# Patient Record
Sex: Female | Born: 1970 | Race: White | Hispanic: No | Smoking: Former smoker
Health system: Southern US, Community
[De-identification: ages and names within clinical notes are randomized; demographics above are authoritative.]

## PROBLEM LIST (undated history)

## (undated) DIAGNOSIS — J45909 Unspecified asthma, uncomplicated: Secondary | ICD-10-CM

## (undated) DIAGNOSIS — R42 Dizziness and giddiness: Secondary | ICD-10-CM

## (undated) DIAGNOSIS — K219 Gastro-esophageal reflux disease without esophagitis: Secondary | ICD-10-CM

## (undated) DIAGNOSIS — G43909 Migraine, unspecified, not intractable, without status migrainosus: Secondary | ICD-10-CM

## (undated) DIAGNOSIS — R569 Unspecified convulsions: Secondary | ICD-10-CM

## (undated) DIAGNOSIS — R112 Nausea with vomiting, unspecified: Secondary | ICD-10-CM

## (undated) DIAGNOSIS — F419 Anxiety disorder, unspecified: Secondary | ICD-10-CM

## (undated) DIAGNOSIS — Z9889 Other specified postprocedural states: Secondary | ICD-10-CM

## (undated) DIAGNOSIS — T7840XA Allergy, unspecified, initial encounter: Secondary | ICD-10-CM

## (undated) HISTORY — DX: Migraine, unspecified, not intractable, without status migrainosus: G43.909

## (undated) HISTORY — PX: UPPER GASTROINTESTINAL ENDOSCOPY: SHX188

## (undated) HISTORY — DX: Allergy, unspecified, initial encounter: T78.40XA

## (undated) HISTORY — PX: COLONOSCOPY: SHX174

## (undated) HISTORY — DX: Anxiety disorder, unspecified: F41.9

## (undated) HISTORY — DX: Unspecified convulsions: R56.9

---

## 1998-07-08 ENCOUNTER — Other Ambulatory Visit: Admission: RE | Admit: 1998-07-08 | Discharge: 1998-07-08 | Payer: Self-pay | Admitting: Obstetrics and Gynecology

## 1998-09-24 ENCOUNTER — Encounter: Admission: RE | Admit: 1998-09-24 | Discharge: 1998-12-09 | Payer: Self-pay | Admitting: Orthopedic Surgery

## 1998-11-02 ENCOUNTER — Ambulatory Visit (HOSPITAL_COMMUNITY): Admission: RE | Admit: 1998-11-02 | Discharge: 1998-11-02 | Payer: Self-pay | Admitting: Orthopedic Surgery

## 1998-11-02 ENCOUNTER — Encounter: Payer: Self-pay | Admitting: Orthopedic Surgery

## 1999-12-12 ENCOUNTER — Other Ambulatory Visit: Admission: RE | Admit: 1999-12-12 | Discharge: 1999-12-12 | Payer: Self-pay | Admitting: Obstetrics and Gynecology

## 2000-12-13 ENCOUNTER — Other Ambulatory Visit: Admission: RE | Admit: 2000-12-13 | Discharge: 2000-12-13 | Payer: Self-pay | Admitting: Obstetrics and Gynecology

## 2001-11-22 ENCOUNTER — Other Ambulatory Visit: Admission: RE | Admit: 2001-11-22 | Discharge: 2001-11-22 | Payer: Self-pay | Admitting: Obstetrics and Gynecology

## 2002-12-25 ENCOUNTER — Other Ambulatory Visit: Admission: RE | Admit: 2002-12-25 | Discharge: 2002-12-25 | Payer: Self-pay | Admitting: Obstetrics and Gynecology

## 2003-05-10 ENCOUNTER — Emergency Department (HOSPITAL_COMMUNITY): Admission: EM | Admit: 2003-05-10 | Discharge: 2003-05-10 | Payer: Self-pay | Admitting: Emergency Medicine

## 2004-01-26 ENCOUNTER — Other Ambulatory Visit: Admission: RE | Admit: 2004-01-26 | Discharge: 2004-01-26 | Payer: Self-pay | Admitting: Obstetrics and Gynecology

## 2004-08-03 ENCOUNTER — Encounter: Admission: RE | Admit: 2004-08-03 | Discharge: 2004-11-01 | Payer: Self-pay | Admitting: Family Medicine

## 2005-05-09 ENCOUNTER — Other Ambulatory Visit: Admission: RE | Admit: 2005-05-09 | Discharge: 2005-05-09 | Payer: Self-pay | Admitting: Obstetrics and Gynecology

## 2006-06-18 ENCOUNTER — Other Ambulatory Visit: Admission: RE | Admit: 2006-06-18 | Discharge: 2006-06-18 | Payer: Self-pay | Admitting: Obstetrics and Gynecology

## 2006-07-17 ENCOUNTER — Ambulatory Visit (HOSPITAL_COMMUNITY): Admission: RE | Admit: 2006-07-17 | Discharge: 2006-07-17 | Payer: Self-pay | Admitting: Obstetrics & Gynecology

## 2006-07-17 ENCOUNTER — Encounter (INDEPENDENT_AMBULATORY_CARE_PROVIDER_SITE_OTHER): Payer: Self-pay | Admitting: *Deleted

## 2006-09-18 ENCOUNTER — Encounter (INDEPENDENT_AMBULATORY_CARE_PROVIDER_SITE_OTHER): Payer: Self-pay | Admitting: Specialist

## 2006-09-18 ENCOUNTER — Inpatient Hospital Stay (HOSPITAL_COMMUNITY): Admission: RE | Admit: 2006-09-18 | Discharge: 2006-09-20 | Payer: Self-pay | Admitting: Obstetrics & Gynecology

## 2006-10-17 ENCOUNTER — Encounter: Admission: RE | Admit: 2006-10-17 | Discharge: 2006-10-17 | Payer: Self-pay | Admitting: Obstetrics & Gynecology

## 2010-03-22 ENCOUNTER — Emergency Department (HOSPITAL_BASED_OUTPATIENT_CLINIC_OR_DEPARTMENT_OTHER): Admission: EM | Admit: 2010-03-22 | Discharge: 2010-03-22 | Payer: Self-pay | Admitting: Emergency Medicine

## 2010-04-16 ENCOUNTER — Emergency Department (HOSPITAL_BASED_OUTPATIENT_CLINIC_OR_DEPARTMENT_OTHER): Admission: EM | Admit: 2010-04-16 | Discharge: 2010-04-17 | Payer: Self-pay | Admitting: Emergency Medicine

## 2010-08-21 ENCOUNTER — Emergency Department (HOSPITAL_BASED_OUTPATIENT_CLINIC_OR_DEPARTMENT_OTHER)
Admission: EM | Admit: 2010-08-21 | Discharge: 2010-08-21 | Payer: Self-pay | Source: Home / Self Care | Admitting: Emergency Medicine

## 2010-08-28 HISTORY — PX: ABDOMINAL HYSTERECTOMY: SHX81

## 2010-10-14 ENCOUNTER — Emergency Department (HOSPITAL_COMMUNITY): Payer: Self-pay

## 2010-10-14 ENCOUNTER — Emergency Department (HOSPITAL_COMMUNITY)
Admission: EM | Admit: 2010-10-14 | Discharge: 2010-10-14 | Disposition: A | Discharge status: Home / Self Care | Payer: Self-pay | Attending: Emergency Medicine | Admitting: Emergency Medicine

## 2010-10-14 DIAGNOSIS — L03211 Cellulitis of face: Secondary | ICD-10-CM | POA: Insufficient documentation

## 2010-10-14 DIAGNOSIS — L0201 Cutaneous abscess of face: Secondary | ICD-10-CM | POA: Insufficient documentation

## 2010-10-14 DIAGNOSIS — Z79899 Other long term (current) drug therapy: Secondary | ICD-10-CM | POA: Insufficient documentation

## 2010-10-14 DIAGNOSIS — R220 Localized swelling, mass and lump, head: Secondary | ICD-10-CM | POA: Insufficient documentation

## 2010-10-14 DIAGNOSIS — G40909 Epilepsy, unspecified, not intractable, without status epilepticus: Secondary | ICD-10-CM | POA: Insufficient documentation

## 2010-10-14 DIAGNOSIS — R51 Headache: Secondary | ICD-10-CM | POA: Insufficient documentation

## 2010-10-14 LAB — POCT I-STAT, CHEM 8
BUN: <3 mg/dL — ABNORMAL LOW (ref 6–23)
Calcium, Ion: 1.16 mmol/L (ref 1.12–1.32)
Chloride: 104 mEq/L (ref 96–112)
Creatinine, Ser: 0.7 mg/dL (ref 0.4–1.2)
Glucose, Bld: 82 mg/dL (ref 70–99)
HCT: 45 % (ref 36.0–46.0)
Hemoglobin: 15.3 g/dL — ABNORMAL HIGH (ref 12.0–15.0)
Potassium: 3.7 mEq/L (ref 3.5–5.1)
Sodium: 140 mEq/L (ref 135–145)
TCO2: 24 mmol/L (ref 0–100)

## 2010-10-14 MED ORDER — IOHEXOL 300 MG/ML  SOLN
100.0000 mL | Freq: Once | INTRAMUSCULAR | Status: AC | PRN
  Administered 2010-10-14: 100 mL via INTRAVENOUS

## 2010-10-19 NOTE — Consult Note (Signed)
NAMEJAHNIA, [name]                ACCOUNT NO.:  [id number]  MEDICAL RECORD NO.:  [id number]           PATIENT TYPE:  E  LOCATION:  WLED                         FACILITY:  [facility]  PHYSICIAN:  Newman Pies, MD            DATE OF BIRTH:  10/27/1970  DATE OF CONSULTATION:  10/14/2010 DATE OF DISCHARGE:  10/14/2010                                CONSULTATION   SERVICE:  Otolaryngology head and neck surgery.  CHIEF COMPLAINT:  Facial abscess.  HISTORY OF PRESENT ILLNESS:  The patient is a 40 year old female who presents to the [facility] Emergency Room today complaining of progressive left facial swelling.  According to the patient, she has been experiencing progressive left facial swelling for the last 3 days. The patient reports that she has a history of a small bump at the left midface.  However, over the last 3 days, it has progressively swollen and it has been very tender to touch.  The patient denies any visual change, breathing difficulty, dysphagia, odynophagia, fever, or dental pain.  She has no previous history of sinonasal surgery or orbital disorder.  Upon arrival at the emergency room, facial CT scan was obtained.  The CT shows soft tissue swelling involving the left maxilla. There is a rim enhancing fluid collection anterior to the left maxillary sinus, lateral to the nose on the left.  The fluid collection measures approximately 1 cm in diameter and is located in the subcutaneous tissue.  The findings are compatible with a soft tissue abscess.  PAST MEDICAL HISTORY:  Migraine and seizure disorder.  PAST SURGICAL HISTORY:  Hysterectomy.  HOME MEDICATIONS:  Phenobarbital  ALLERGIES:  ASPIRIN, PENICILLIN, IBUPROFEN.  PHYSICAL EXAMINATION:  VITAL SIGNS:  Temperature 98.1, blood pressure 92/57, pulse 85, respiration 18, oxygen saturation 96% on room air. GENERAL:  The patient is a well-nourished and well-developed 40 year old female in moderate distress secondary to the  facial pain. She is alert and oriented x3. HEENT:  Her pupils are equal, round, reactive to light.  Extraocular motion is intact.  Examination of the ears shows normal auricles, external auditory canals, and tympanic membranes bilaterally.  Facial examination shows severe edema and erythema of the left midface.  The edema is most acute at the infraorbital area immediately lateral to the nose.  Nasal examination shows normal mucosa, septum, and turbinates. Oral cavity examination shows normal lips, gums, tongue, oral cavity and oropharyngeal mucosa. NECK:  Palpation of the neck reveals no lymphadenopathy or mass.  The trachea is midline.  No stridor is noted.  PROCEDURE PERFORMED:  Incision and drainage of the facial abscess.  ANESTHESIA:  Local anesthesia with 2% lidocaine.  DESCRIPTION:  The patient was placed supine on the hospital bed.  The left midface was prepped in a sterile fashion with Betadine.  A 2% lidocaine was subsequently infiltrated around the left midface area. After adequate local anesthesia was achieved, a #11 blade was used to make an incision overlying the left facial abscess.  Purulent material was expressed from the abscess site.  The abscess drainage area was extensively explored and irrigated.  That concluded the procedure for the patient. The patient tolerated the procedure well.  IMPRESSION:  Left facial cellulitis/abscess.  RECOMMENDATIONS: 1. Incision and drainage of the left facial abscess in the emergency     room. 2. The patient will be placed on clindamycin 300 mg p.o. q.i.d. for 10     days. 3. Vicodin one to two tablet p.o. q.4-6 h p.r.n. pain. 4. The patient will follow up in my office in approximately 10 days.     Newman Pies, MD     ST/MEDQ  D:  10/14/2010  T:  10/15/2010  Job:  [id number]  Electronically Signed by Newman Pies MD on 10/19/2010 11:34:56 AM

## 2011-01-13 NOTE — Op Note (Signed)
NAMEVIVEKA, WILMETH                ACCOUNT NO.:  [id number]   MEDICAL RECORD NO.:  [id number]          PATIENT TYPE:  AMB   LOCATION:  SDC                           FACILITY:  [facility]   PHYSICIAN:  M. Leda Quail, MD  DATE OF BIRTH:  [birth year]   DATE OF PROCEDURE:  07/17/2006  DATE OF DISCHARGE:  07/17/2006                               OPERATIVE REPORT   PREOPERATIVE DIAGNOSES:  40. A 40 year old G-0 married white female with chronic pelvic pain.  2. History of seizure disorder.  3. Poor dentition.  4. Smoking history.   POSTOPERATIVE DIAGNOSIS:  1. A 40 year old G-0 married white female with chronic pelvic pain.  2. History of seizure disorder.  3. Poor dentition.  4. Smoking history.  5. There is a 5 cm right simple cyst with filmy pelvic adhesions and      dense bladder adhesions.  She as well has adhesions on the right      sidewall.   PROCEDURE:  Diagnostic laparoscopy with right cyst removal and lysis of  adhesions for approximately 20 minutes.   SURGEON:  M. Leda Quail, MD   ASSISTANT:  OR staff.   ANESTHESIA:  General endotracheal.   SPECIMEN:  Cyst fluid sent to cytology.   ESTIMATED BLOOD LOSS:  Minimal.   URINE OUTPUT:  25 mL drained at the beginning of the procedure by I&O  cath.   FLUIDS:  1300 mL of LR.   COMPLICATIONS:  None.   INDICATIONS:  This area is a 40 year old married white female with a  history of chronic pelvic pain.  She has been followed by Shirlyn Goltz  for many years.  She has had an acute change in her of pelvic pain which  has been worse on the right side.  The patient did undergo a pelvic  ultrasound for preoperative workup which showed a small simple cyst on  the right ovary.  The patient was initially interested in having her  ovary removed due to the cyst but after discussing the cyclical nature  of cysts and her chronic pelvic pain we decided to go ahead and proceed  with a diagnostic laparoscopy to see if we could identify  the source of  her pelvic pain.  Pain is somewhat cyclical and related to menstrual  cycles but in general there most of the time.   OPERATIVE COURSE:  The patient was taken to the operating room.  She was  placed in the supine position.  General anesthesia was administered  without difficulty by the anesthesia staff.  The patient's legs were  placed in the low lithotomy position in Keene stirrups.  The abdomen,  perineum, inner thighs and vagina were prepped in a normal sterile  fashion.  The bladder was drained of all urine with a red rubber  catheter.  Then the abdomen and perineum were draped in a normal sterile  fashion.  Using a knife a 10 mm skin incision was made beneath the  umbilicus.  Subcutaneous fat and tissue was dissected.  The abdomen was  lifted by the operator after palpation of the  sacral promontory.  Using  a Veress needle and aiming toward the pelvis, the abdominal wall layers  were traversed.  Once the peritoneum was popped through, sterile saline  was attached to the Veress needle.  The saline was then aspirated.  No  fluid was noted.  Fluid was injected easily and aspirated again and  there was no evidence of saline or other fluid that was returned.  A  drip test was performed and fluid dripped easily into the pelvis.  CO2  gas was then attached and placed on low flow.  Pressures were less and  10 mmHg and pneumoperitoneum was achieved without difficulty.  Once 2-  1/2 liters of CO2 gas were in the abdomen the Veress needle was removed.  The abdomen was again elevated and using a #10/11 bladed trocar and port  this trocar and port were placed into abdomen by aiming toward the  pelvis with direct entry.  The trocar was removed and laparoscope was  used to confirm intraperitoneal placement.  This was indeed present.  Patient positioned in Trendelenberg positioning.  The pelvis was  surveyed.  There was about a 5 cm right simple cyst that was noted on  the ovary.   There were adhesions along the fallopian tubes bilaterally.  There was adhesions all throughout the posterior cul-de-sac.  There was  dense adhesion connecting the uterus to the bladder flap and keeping the  uterus from being mobile.  The fallopian tubes bilaterally appeared  clubbed and relatively unhealthy.  The upper abdomen was surveyed.  There was no adhesions to the liver edge.  The liver edge appeared  normal.  The gallbladder appeared normal.  Stomach edge appeared normal.  There were some adhesions on the right sidewall.  The patient has not  had any previous surgeries so these cannot be explained other than  previous pelvic infection.   As the surgery was going to involve an operative portion, placement for  right and left lower quadrant ports were chosen using transillumination  of the abdominal wall vessels.  2 mL of 0.5% Marcaine were instilled in  the skin before 5 mm skin incisions were made in the right and left  lower quadrants.  The 5 mm bladed trocars and ports were placed in the  right and left lower quadrant under direct visualization with the  laparoscope.  Trocars were removed..  Then using a smooth endoscopic  grasper and endoscopic scissors with the bipolar cautery attached, the  pelvic adhesions were initially free from the left fallopian tube and  the left ovary until the left tube and ovary were freely mobile.  Then  pelvic adhesions were dissected free in the pelvis.  Cautery was  performed as necessary.  There were no bowel adhesions.  Then the large  right ovarian cyst was freed and elevated.  Once this was performed  using a bipolar cautery an incision was made in the cyst and the simple  fluid was drained.  The cyst wall was then removed by grasping with a  smooth endoscopic grasper.  Where the incision was made in the ovarian  cyst there was a small amount of bleeding and this was made hemostatic. The lysis of adhesions took approximately 20 minutes.  At  this point the  right and left ovaries were freely mobile, the fallopian tubes were  mobile and all of pelvic adhesions had been lysed.  A Nezhat suction  irrigator was then used to irrigate the pelvis.  No bleeding  was noted.  At this point the procedure was terminated.  Tendelenberg positioning  was relieved.  The instruments were removed.  The right and left lower  quadrant ports were removed under direct visualization.  The  pneumoperitoneum was then released.  The anesthesiologist gave several  breaths to the patient while the operator was pressing on the abdomen to  ensure all CO2 gas was removed.  Then the midline port was removed.  The  operator's index finger was placed in the incision to ensure that there  was no bowel in the incision.   The fascia of the umbilicus was closed with figure-of-eight suture of #2-  0 Vicryl on a UR needle.  The skin of the umbilicus was then closed with  a subcuticular stitch of 3-0 Vicryl Rapide.  The right and left lower  quadrant ports were hemostatic and they were closed with Dermabond.  All  instruments were then removed from the vagina.  The  patient's legs were placed back in the supine position.  Sponge, lap,  needle and instrument counts were correct x2.  The patient tolerated the  procedure well.  She was awakened from anesthesia and taken to recovery  room in stable vision.      Lum Keas, MD  Electronically Signed     MSM/MEDQ  D:  07/18/2006  T:  07/19/2006  Job:  [id number]

## 2011-01-13 NOTE — Op Note (Signed)
NAMECHRYSTINA, [name]                ACCOUNT NO.:  [id number]   MEDICAL RECORD NO.:  [id number]          PATIENT TYPE:  INP   LOCATION:  9312                          FACILITY:  [facility]   PHYSICIAN:  M. Leda Quail, MD  DATE OF BIRTH:  Sep 06, 1970   DATE OF PROCEDURE:  09/18/2006  DATE OF DISCHARGE:                               OPERATIVE REPORT   PREOPERATIVE DIAGNOSES:  73. A 40 year old gravida 0 white female with a history of chronic      pelvic pain.  2. Probable history of pelvic inflammatory disease.  3. History of recurrent ovarian cyst.  4. History of asthma.  5. Poor dentition.  6. Seizure disorder.   POSTOPERATIVE DIAGNOSES:  6. A 40 year old gravida 0 white female with a history of chronic      pelvic pain.  2. Probable history of pelvic inflammatory disease.  3. History of recurrent ovarian cyst.  4. History of asthma.  5. Poor dentition.  6. Seizure disorder.   PROCEDURE:  1. Total abdominal hysterectomy.  2. Right salpingo-oophorectomy.   SURGEON:  Hyacinth Meeker.   ASSISTANT:  Romine.   ANESTHESIA:  General endotracheal.   SPECIMENS:  1. Uterus.  2. Cervix.  3. Right fallopian tube and ovary.   EBL:  100.   FLUIDS:  2300 mL of LR.   URINE OUTPUT:  200 mL of clear urine in the Foley catheter.   COMPLICATIONS:  None.   INDICATIONS:  Ms. [name] is a 40 year old G0 white female who has a  history of chronic pelvic pain.  As this has been bothering for years,  at her last annual exam possible laparoscopy for further diagnosis was  discussed with the patient.  She does have a history of infertility;  however, at this point in her life she is not at all interested in  conceiving and is just interested in having resolution of the pain.  She  did initially undergo an ultrasound back in October which showed a small  ovarian cyst.  Because of its size, I did not feel it was appropriate to  remove her ovary, which she and I did actually discuss.  She did undergo  a laparoscopy and she had dense bladder adhesions as well as several  adhesions around the right ovary, in the pelvis and adhesions into the  posterior cul-de-sac.  She also had a much larger ovarian cyst which was  excised and the surgery went well.  The patient did have improvement in  her pain over the next 3-4 weeks but then the pain quickly returned to  the way it was preoperatively.  She and I had discussed after the  surgery that if this recurred and she was interested in a repeat  laparoscopy this could be performed, or if she wanted more definitive  management this could also be performed.  When she came back in December  she was already ready to discuss a hysterectomy and we therefore posted  her at a time that was appropriate with her family and work.   PROCEDURE:  Patient is taken to the operating room.  She is placed in  the supine position.  General anesthesia was administered by the  anesthesiologist without difficulty.  Her intubation was very easy this  time.  She did say that her intubation was more difficult last time, but  today went without difficulty.  The legs were positioned in the frog leg  position and a running IV is present that had been placed.  The abdomen,  perineum, inner thighs and vagina were prepped in normal sterile fashion  with Betadine.  A Foley catheter was inserted under sterile conditions.  The patient's legs were then straightened.  The abdomen is then draped  in a normal sterile fashion.   Using a knife, a Pfannenstiel skin incision is made and carried down  through the subcutaneous fatty tissue.  The fascia was identified and  nicked in the midline, the fascial incision was extended laterally using  carbonated scissors.  Kocher clips were applied to the superior aspect  of the fascial incision and the underlying rectus muscles were dissected  off sharply.  In a similar fashion Kocher clamps were applied to the  inferior aspect of the fascial  incision and the underlying rectus  muscles were dissected off sharply.  The peritoneum was identified and  incised superiorly.  Peritoneal incision was extended superiorly and  inferiorly with care taken to note location of the bladder.  At this  point the pelvis was surveyed.  The right ovary was enlarged again, like  it was at the time of laparoscopy, with a sizeable cyst at least 5-6 cm  in size.  This did appear to be a simple cyst.  There were some filmy  and dense adhesions around the ovary on the right side.  The left ovary  was completely fine.  There was one very fine adhesion between the  fallopian tube and the ovary on the left side and this was incised  sharply.  The dense bladder adhesions had reformed or were present at  the time of surgery.  Once this ovary was noted to be enlarged again,  the decision was made to go ahead and remove it, as the patient and I  had discussed preoperatively.  An Alexis bowel retractor was used, size  large.  This was placed intraabdominally.  Once the retractor was in  place then two moist laparotomy sponges were used to pack the bowel  superiorly and up out of the way.   Long Kelly clamps were placed across either side of the uterine ovarian  ligament.  The superior adhesions on the ovary on the right side were  taken down so that the ovary was more free.  There was still some  underneath the ovary which at this point could not be dissected because  they could not been seen very well.  The ureters were identified on both  sides.   Left round ligament was suture ligated with #0 Vicryl, then the round  ligament was incised with cautery.  The broad ligament was opened  anteriorly and the peritoneum was incised to the level of the internal  os on the left side.  The uterine ovarian ligament on the left side was  then isolated and clamped with a curved Heaney clamp, pedicles transected and suture ligated, first with a free tie of #0 Vicryl  and  then a stitch of #0 Vicryl.  The uterine artery on the left side was  then skeletonized.  At this point attention was turned to the right  side.  The right round ligament was suture ligated with #0 Vicryl.  It  was then incised and the anterior lip of the broad ligament was opened  by sizing the peritoneum down to the level of the internal os and  connecting with a previous incision that was made.  Then the posterior  leaflet of the broad ligament was opened with the peritoneum incised  parallel to the IP ligament.  The IP ligament on the right side was  isolated above the level of the ureter.  This was clamped with a curved  Heaney clamp and a second curved Heaney clamp was used for backbleeding.  This pedicle was transected and suture ligated with a free tie of #0  Vicryl and another stitch tie of #0 Vicryl.  The uterine artery on this  side was skeletonized, then the adhesions that were down low in the  pelvis could be more easily visualized around the right ovary.  These  were taken down sharply.   The pubovesicocervical fascia was then incised to separate the bladder  from the cervix.  The white glissening tissue of the cervix was well  visualized after the filmy adhesions up high on the bladder were taken  down.  The bladder flap was created further with blunt dissection by  pushing down the the cervix using a sponge stick.  At this point the  uterine arteries were clamped bilaterally, transected and doubly suture  ligated with #0 Vicryl.  At this point, the cardinal ligaments were  serially clamped, transected and suture ligated with #0 Vicryl.  This  was done serially down the side of the cervix with further dissection of  the bladder flap as necessary.  The uterosacral ligaments were then  clamped bilaterally with curved Heaney clamps, they were transected and  suture ligated with a Heaney stitch of #0 Vicryl.  At this point, a  curved Heaney clamp will be placed around the  corner of the right side  of the cervix.  The pedicle was transected and suture ligated with a  Heaney stitch of #0 Vicryl.  At this point, using Jorgenson scissors  vaginal mucosa was incised at the edge of the cervix.  The scissors were  kept close to the cervix and the cervix was incised completely  circumferentially to free the cervix from the vaginal mucosa.  Kocher  clamps were attached to the vaginal mucosa to ensure that the edges were  well visualized.  At this point, the uterus, cervix, right ovary and  fallopian tube were handed off.  Photo documentation was made and then  they were sent to pathology.   The corners of the vaginal mucosa were then grasped with Kocher clamps.  An extension of #0 Vicryl was placed below the level of the corner to  ensure that excellent hemostasis was present.  Each corner stitch was  then attached to the ipsilateral uterosacral ligament to suspend the vaginal cuff.  The vaginal was then closed with three additional figure-  of-eight sutures of #0 Vicryl.   The pelvis was irrigated with copious amounts of warm normal saline.  Where the ovary had been adhesed on the right side there were some areas  of bleeding on the peritoneal edge.  These were made hemostatic with  cautery.  The cuff edge was hemostatic, the bladder flap was hemostatic,  the right and left round ligaments were hemostatic, the right  infundibulopelvic ligament was hemostatic and the left uterine ovarian  ligament was hemostatic.  Gelfoam  was placed across the level of the  bladder flap, due to the amount of dense adhesions that were present,  and a small piece of Gelfoam was placed down in the pelvis where the  adhesions and bleeding had been present.  Then an Interceed was obtained  and this was placed all in the posterior cul-de-sac and across the top  of the vaginal cuff, where the adhesions were most present, to help  prevent adhesive formation in the future.  At this point,  excellent  hemostasis was noted and the surgical portion of the procedure was  ended.  The two laparotomy sponges used to pack the bowel were removed  and the retractor was removed.  The bowel and omentum were placed back  in normal anatomic position.  The peritoneum was closed with a running  stitch of #0 Vicryl.  The subfascial tissue and rectus muscles were  visualized, no bleeding was noted.  An On-Q pump tubing was placed on  the right side of the patient subfascially.  Then the fascia was closed  with a running stitch of #0 Vicryl.  This stitch was placed in the  corner and run to the midline bilaterally to close the fascia.  Then the  subcutaneous tissue was irrigated, hemostasis was achieved with cautery.  A second subcutaneous On-Q pump tubing was placed on the patient's left  side into the subcu space.  Then the skin was closed with a subcuticular  stitch of #3-0 Vicryl.  The incision was cleansed.  Benzoin and Steri-  Strips were applied.  The On-Q pump tubing was attached to the skin  using Steri-Strips.  Lidocaine 1%, 5 mL, was instilled subcutaneously  and 10 mL of 1% lidocaine were instilled subfascially.   Sponge, lap, needle, instrument counts were correct x2.  The patient  tolerated the procedure very well, she was awakened from anesthesia,  extubated without difficulty and taken to the recovery room in stable  condition.      Lum Keas, MD  Electronically Signed     MSM/MEDQ  D:  09/18/2006  T:  09/18/2006  Job:  [id number]

## 2011-01-13 NOTE — Discharge Summary (Signed)
NAMETYJAE, SHVARTSMAN                ACCOUNT NO.:  [id number]   MEDICAL RECORD NO.:  [id number]          PATIENT TYPE:  INP   LOCATION:  9312                          FACILITY:  [facility]   PHYSICIAN:  M. Leda Quail, MD  DATE OF BIRTH:  03/20/1971   DATE OF ADMISSION:  09/18/2006  DATE OF DISCHARGE:                               DISCHARGE SUMMARY   ADMISSION DIAGNOSIS:  1. A 40 year old married white female with chronic pelvic pain.  2. History of recurrent ovarian cyst.  3. Seizure disorder.  4. Smoking history.  5. History of probable pelvic inflammatory disease in the past causing      pelvic adhesions.   PROCEDURE:  TAH/RSO.   HOSPITAL COURSE:  Written H&P is in the chart.  However, Ms. Reddington is  a 40 year old G1 married white female with history of chronic pelvic  pain.  She underwent a laparoscopy in November 2007 which showed both  adhesions and a large right ovarian cyst that had been followed on  ultrasound.  The cyst was removed, the adhesions were taken down.  The  patient had resolution of pain for several weeks.  However, in December  when I saw her back again, the pain had begun all over again, was in  exact same location.  She wanted definitive therapy at this time and we  discussed going ahead and proceeding with a hysterectomy and removing  the right ovary.  She does understand that this means she will not have  children in the future and she is okay with this.  Definitive management  was planned and informed consent was obtained.   The patient was admitted through the same-day surgery on September 18, 2006.  She was taken to the OR where a TAH/RSO was performed.  The  findings at the time of surgery include another large right ovarian cyst  with adhesions to the pelvic sidewall and the uterus and the posterior  cul-de-sac.  The patient did well during the procedure.  She had 100 mL  of blood loss.  Intercede was placed in the pelvis to help prevent any  adhesion  formation in the future once the hysterectomy and RSO were  completed.   After the patient was extubated and taken from the operating room to the  recovery room, she was monitored there for an appropriate amount of time  and then taken to the third floor for the remainder of her  hospitalization.  In the first day of her postoperative period she has  had some pain issues.  She had a Dilaudid PC which was increased to a  full dose but she was given two doses of IM morphine 10 mg, one in the  afternoon and one in the evening to help with pain control.  After this,  she had great pain control and had no further issues.  By the morning of  postoperative day #0 she had good bowel sounds, her nausea had resolved,  she was ready to eat, and she was under good pain control.  She was  afebrile with stable vital signs.  She had made 3150 mL of urine output.  Postoperative hemoglobin was 12.8, her white count was 10.5, platelet  count was 177, electrolytes were normal with a BUN and creatinine of 1  and 0.5.  Her Foley catheter was removed.  The patient was encouraged to  ambulate.  She was allowed to shower.  She started on a regular diet and  she started on oral pain medicines.  She did well with breakfast.  Her  IV was heparin-locked.  She did well throughout the day and was seen in  the evening of postoperative day #0.  There were no new complaints.  By  the morning of postoperative day #2 she had passed flatus, she was under  good pain control with Percocet and Motrin, otherwise had no complaints.  She was afebrile with stable vital signs.  Her incision was clean, dry,  and intact.  She had great bowel sounds.  Her abdomen was soft and  nontender.  She did have an On-Q pump that had been placed at the end of  her surgery for pain control.  These tubings were removed in the morning  of postoperative day #2.  Dressings were applied to the areas.  She had  Steri-Strips on her incision.  No other  care for her Pfannenstiel  incision was required.  At this point she was ambulating, voiding,  tolerating a regular diet, passing flatus and had good pain control with  oral pain medicines.  Discharge was felt appropriate.  Pathology is  still pending at this point.   The patient is discharged with medication prescriptions for Percocet and  Motrin, as well as she requested a small prescription for Restoril  because this did help her rest in the hospital.  She is going to resume  taking her Midrin as she needs and phenobarbital as normal.  The patient  will be seen in 1 week.  She already has a followup appointment for  this.  This is on January 30 in my office.      Lum Keas, MD  Electronically Signed     MSM/MEDQ  D:  09/20/2006  T:  09/20/2006  Job:  [phone]

## 2011-06-15 ENCOUNTER — Emergency Department (HOSPITAL_COMMUNITY): Payer: Self-pay

## 2011-06-15 ENCOUNTER — Emergency Department (HOSPITAL_COMMUNITY)
Admission: EM | Admit: 2011-06-15 | Discharge: 2011-06-15 | Disposition: A | Discharge status: Home / Self Care | Payer: Self-pay | Attending: Emergency Medicine | Admitting: Emergency Medicine

## 2011-06-15 DIAGNOSIS — G43909 Migraine, unspecified, not intractable, without status migrainosus: Secondary | ICD-10-CM | POA: Insufficient documentation

## 2011-06-15 DIAGNOSIS — R509 Fever, unspecified: Secondary | ICD-10-CM | POA: Insufficient documentation

## 2011-06-15 DIAGNOSIS — G40802 Other epilepsy, not intractable, without status epilepticus: Secondary | ICD-10-CM | POA: Insufficient documentation

## 2011-06-15 DIAGNOSIS — R059 Cough, unspecified: Secondary | ICD-10-CM | POA: Insufficient documentation

## 2011-06-15 DIAGNOSIS — R05 Cough: Secondary | ICD-10-CM | POA: Insufficient documentation

## 2011-06-15 DIAGNOSIS — N12 Tubulo-interstitial nephritis, not specified as acute or chronic: Secondary | ICD-10-CM | POA: Insufficient documentation

## 2011-06-15 DIAGNOSIS — R070 Pain in throat: Secondary | ICD-10-CM | POA: Insufficient documentation

## 2011-06-15 DIAGNOSIS — Z79899 Other long term (current) drug therapy: Secondary | ICD-10-CM | POA: Insufficient documentation

## 2011-06-15 LAB — URINALYSIS, ROUTINE W REFLEX MICROSCOPIC
Bilirubin Urine: NEGATIVE
Glucose, UA: NEGATIVE mg/dL
Ketones, ur: NEGATIVE mg/dL
Nitrite: POSITIVE — AB
Protein, ur: 30 mg/dL — AB
Specific Gravity, Urine: 1.018 (ref 1.005–1.030)
Urobilinogen, UA: 1 mg/dL (ref 0.0–1.0)
pH: 7 (ref 5.0–8.0)

## 2011-06-15 LAB — DIFFERENTIAL
Basophils Absolute: 0 10*3/uL (ref 0.0–0.1)
Basophils Relative: 0 % (ref 0–1)
Eosinophils Absolute: 0 10*3/uL (ref 0.0–0.7)
Eosinophils Relative: 0 % (ref 0–5)
Lymphocytes Relative: 11 % — ABNORMAL LOW (ref 12–46)
Lymphs Abs: 1.5 10*3/uL (ref 0.7–4.0)
Monocytes Absolute: 1.4 10*3/uL — ABNORMAL HIGH (ref 0.1–1.0)
Monocytes Relative: 10 % (ref 3–12)
Neutro Abs: 10.6 10*3/uL — ABNORMAL HIGH (ref 1.7–7.7)
Neutrophils Relative %: 79 % — ABNORMAL HIGH (ref 43–77)

## 2011-06-15 LAB — CBC
HCT: 38.9 % (ref 36.0–46.0)
Hemoglobin: 13.1 g/dL (ref 12.0–15.0)
MCH: 35.4 pg — ABNORMAL HIGH (ref 26.0–34.0)
MCHC: 33.7 g/dL (ref 30.0–36.0)
MCV: 105.1 fL — ABNORMAL HIGH (ref 78.0–100.0)
Platelets: 195 10*3/uL (ref 150–400)
RBC: 3.7 MIL/uL — ABNORMAL LOW (ref 3.87–5.11)
RDW: 12.7 % (ref 11.5–15.5)
WBC: 13.5 10*3/uL — ABNORMAL HIGH (ref 4.0–10.5)

## 2011-06-15 LAB — URINE MICROSCOPIC-ADD ON

## 2011-06-15 LAB — BASIC METABOLIC PANEL
BUN: 6 mg/dL (ref 6–23)
CO2: 25 mEq/L (ref 19–32)
Calcium: 8.6 mg/dL (ref 8.4–10.5)
Chloride: 99 mEq/L (ref 96–112)
Creatinine, Ser: 0.48 mg/dL — ABNORMAL LOW (ref 0.50–1.10)
GFR calc Af Amer: >90 mL/min (ref >90)
GFR calc non Af Amer: >90 mL/min (ref >90)
Glucose, Bld: 142 mg/dL — ABNORMAL HIGH (ref 70–99)
Potassium: 3.9 mEq/L (ref 3.5–5.1)
Sodium: 133 mEq/L — ABNORMAL LOW (ref 135–145)

## 2011-06-17 LAB — URINE CULTURE
Colony Count: >=100000
Culture  Setup Time: 201210190108

## 2012-12-12 ENCOUNTER — Other Ambulatory Visit: Payer: Self-pay

## 2012-12-12 MED ORDER — PHENOBARBITAL 97.2 MG PO TABS: 194.4000 mg | ORAL_TABLET | ORAL | Status: DC

## 2012-12-12 NOTE — Telephone Encounter (Signed)
6 months total max allowed for Phenobarb Rx's

## 2013-06-11 ENCOUNTER — Other Ambulatory Visit: Payer: Self-pay | Admitting: Neurology

## 2013-06-12 NOTE — Telephone Encounter (Signed)
Dr Marjory Lies is out of the office.  Forwarding request to Dr Eulah Citizen

## 2013-06-13 NOTE — Telephone Encounter (Signed)
Rx signed and faxed.

## 2013-06-30 ENCOUNTER — Telehealth: Payer: Self-pay | Admitting: Diagnostic Neuroimaging

## 2013-06-30 NOTE — Telephone Encounter (Signed)
called patient to reschedule appt,  called all phone numbers it was the wrong number, called patient contact  number was out of service.

## 2013-07-04 ENCOUNTER — Telehealth: Payer: Self-pay | Admitting: Diagnostic Neuroimaging

## 2013-07-04 NOTE — Telephone Encounter (Signed)
Called patient back to reschedule appt per penumalli, left message to call back.

## 2013-07-09 ENCOUNTER — Other Ambulatory Visit: Payer: Self-pay | Admitting: Diagnostic Neuroimaging

## 2013-07-09 NOTE — Telephone Encounter (Signed)
Medication refill for Phenobartitol-97.2mg ([facility])

## 2013-07-11 ENCOUNTER — Other Ambulatory Visit: Payer: Self-pay | Admitting: Diagnostic Neuroimaging

## 2013-07-11 MED ORDER — PHENOBARBITAL 97.2 MG PO TABS: 97.2000 mg | ORAL_TABLET | Freq: Every morning | ORAL | Status: DC

## 2013-07-11 NOTE — Telephone Encounter (Signed)
Pt's prescription was faxed over to [facility] at [phone].

## 2013-07-17 ENCOUNTER — Ambulatory Visit: Payer: Self-pay | Admitting: Diagnostic Neuroimaging

## 2013-09-08 ENCOUNTER — Other Ambulatory Visit: Payer: Self-pay | Admitting: Diagnostic Neuroimaging

## 2013-10-21 ENCOUNTER — Encounter: Payer: Self-pay | Admitting: Diagnostic Neuroimaging

## 2013-10-21 ENCOUNTER — Encounter (INDEPENDENT_AMBULATORY_CARE_PROVIDER_SITE_OTHER): Payer: Self-pay

## 2013-10-21 ENCOUNTER — Ambulatory Visit (INDEPENDENT_AMBULATORY_CARE_PROVIDER_SITE_OTHER): Payer: Self-pay | Admitting: Diagnostic Neuroimaging

## 2013-10-21 VITALS — BP 107/69 | HR 76 | Temp 98.3°F | Ht 65.5 in | Wt 128.0 lb

## 2013-10-21 DIAGNOSIS — G40B09 Juvenile myoclonic epilepsy, not intractable, without status epilepticus: Secondary | ICD-10-CM

## 2013-10-21 DIAGNOSIS — G40309 Generalized idiopathic epilepsy and epileptic syndromes, not intractable, without status epilepticus: Secondary | ICD-10-CM

## 2013-10-21 MED ORDER — PHENOBARBITAL 97.2 MG PO TABS: 194.4000 mg | ORAL_TABLET | Freq: Every day | ORAL | Status: DC

## 2013-10-21 NOTE — Progress Notes (Signed)
[facility]  PATIENT: [name] DOB: 10/26/1970  REFERRING CLINICIAN:  HISTORY FROM: patient REASON FOR VISIT: follow up   HISTORICAL  CHIEF COMPLAINT:  Chief Complaint  Patient presents with  . Follow-up    epilepsy, migraine, HA    HISTORY OF PRESENT ILLNESS:   UPDATE 10/21/13: Since last visit, no new events or seizures. Now has [facility], but no PCP. Tolerating PB 97.2mg  tabs (2 tabs qAM).   UPDATE 05/29/12: No seizure. No PCP. No insurance.   UPDATE 12/22/10: No seizure since age 61's.  Tolerating PB, and wants to stay on it.  Having 1 migraine every 1-2 years (global, severe, pressure, photophobia, phonophobia).  Usually midrin helps.  Never tried triptan.  Also using tylenol for tension HA.  Last migraine Aug 2011.  PRIOR HPI (12/15/08, Dr. Gaynell Face): "Mary is a 43 year old married woman with a long-standing history of juvenile myoclonic epilepsy.  She has not had seizures for years.  The only manifestation of her symptoms has been generalized tonic-clonic seizures.  She has taken and tolerated Phenobarbital without significant side effects.  Patient has daily headaches that I think are more tension type in nature than migraine.  Become on later in the day except when she has stress.  Since her last visit, she had hysterectomy and oophorectomy for removal of an ovarian cyst.  She was noted to have scarred fallopian tubes at the time of operation."  REVIEW OF SYSTEMS: Full 14 system review of systems performed and notable only for nothing.  ALLERGIES: Allergies  Allergen Reactions  . Asa Buff (Mag [Buffered Aspirin]   . Ibuprofen   . Penicillins     HOME MEDICATIONS: Outpatient Prescriptions Prior to Visit  Medication Sig Dispense Refill  . PHENobarbital (LUMINAL) 97.2 MG tablet Take 2 tablets (194.4 mg total) by mouth daily.  60 tablet  1  . isometheptene-acetaminophen-dichloralphenazone (MIDRIN) 65-325-100 MG capsule Take 2 capsules  by mouth 4 (four) times daily as needed for migraine. Maximum 5 capsules in 12 hours for migraine headaches, 8 capsules in 24 hours for tension headaches.       No facility-administered medications prior to visit.    PAST MEDICAL HISTORY: Past Medical History  Diagnosis Date  . Migraine   . Seizure     PAST SURGICAL HISTORY: History reviewed. No pertinent past surgical history.  FAMILY HISTORY: Family History  Problem Relation Age of Onset  . Hypertension Mother     SOCIAL HISTORY:  History   Social History  . Marital Status: Married    Spouse Name: [name]    Number of Children: 0  . Years of Education: 10th   Occupational History  .  [employer]   Social History Main Topics  . Smoking status: Current Every Day Smoker -- 1.00 packs/day    Types: Cigarettes  . Smokeless tobacco: Never Used  . Alcohol Use: No  . Drug Use: No  . Sexual Activity: Not on file   Other Topics Concern  . Not on file   Social History Narrative   Patient lives at home with spouse.   Caffeine Use: 4-5 cups daily     PHYSICAL EXAM  Filed Vitals:   10/21/13 1117  BP: 107/69  Pulse: 76  Temp: 98.3 F (36.8 C)  TempSrc: Oral  Height: 5' 5.5" (1.664 m)  Weight: 128 lb (58.06 kg)    Not recorded    Body mass index is 20.97 kg/(m^2).  GENERAL EXAM: Patient is in no distress;  well developed, nourished and groomed; neck is supple; POOR DENTITION.  CARDIOVASCULAR: Regular rate and rhythm, no murmurs, no carotid bruits  NEUROLOGIC: MENTAL STATUS: awake, alert, oriented to person, place and time, recent and remote memory intact, normal attention and concentration, language fluent, comprehension intact, naming intact, fund of knowledge appropriate CRANIAL NERVE: no papilledema on fundoscopic exam, pupils equal and reactive to light, visual fields full to confrontation, extraocular muscles intact, no nystagmus, facial sensation and strength symmetric, hearing intact, palate elevates  symmetrically, uvula midline, shoulder shrug symmetric, tongue midline. MOTOR: normal bulk and tone, full strength in the BUE, BLE SENSORY: normal and symmetric to light touch COORDINATION: finger-nose-finger, fine finger movements normal REFLEXES: deep tendon reflexes present and symmetric GAIT/STATION: narrow based gait; able to walk on toes, heels and tandem; romberg is negative    DIAGNOSTIC DATA (LABS, IMAGING, TESTING) - I reviewed patient records, labs, notes, testing and imaging myself where available.  Lab Results  Component Value Date   WBC 13.5* 06/15/2011   HGB 13.1 06/15/2011   HCT 38.9 06/15/2011   MCV 105.1* 06/15/2011   PLT 195 06/15/2011      Component Value Date/Time   NA 133* 06/15/2011 1800   K 3.9 06/15/2011 1800   CL 99 06/15/2011 1800   CO2 25 06/15/2011 1800   GLUCOSE 142* 06/15/2011 1800   BUN 6 06/15/2011 1800   CREATININE 0.48* 06/15/2011 1800   CALCIUM 8.6 06/15/2011 1800   GFRNONAA >90 06/15/2011 1800   GFRAA >90 06/15/2011 1800   No results found for this basename: CHOL, HDL, LDLCALC, LDLDIRECT, TRIG, CHOLHDL   No results found for this basename: HGBA1C   No results found for this basename: VITAMINB12   No results found for this basename: TSH      ASSESSMENT AND PLAN  43 y.o. year old female here with seizure d/o (JME) since childhood, but no seizure since 43 years old.  Tolerating PB and wants to stay on it.  Also with infrequent migraine.  PLAN: 1. Continue PB for sz control; I would prefer to switch her to LEV 500mg  BID, but she is reluctant 2. Establish with PCP and dentist for general health maintenance  Meds ordered this encounter  Medications  . PHENobarbital (LUMINAL) 97.2 MG tablet    Sig: Take 2 tablets (194.4 mg total) by mouth daily.    Dispense:  60 tablet    Refill:  5    Pharmacy Fax [phone]   Return in about 1 year (around 10/21/2014) for with Jarold Motto, MD 6/37/8588, 50:27  AM Certified in Neurology, Neurophysiology and Neuroimaging  [facility] [address], [place], [place] [address] [phone]

## 2014-04-28 ENCOUNTER — Other Ambulatory Visit: Payer: Self-pay | Admitting: Diagnostic Neuroimaging

## 2014-04-29 NOTE — Telephone Encounter (Signed)
Rx signed and faxed.

## 2014-07-28 ENCOUNTER — Other Ambulatory Visit: Payer: Self-pay | Admitting: Family Medicine

## 2014-07-28 DIAGNOSIS — Z1231 Encounter for screening mammogram for malignant neoplasm of breast: Secondary | ICD-10-CM

## 2014-09-04 ENCOUNTER — Ambulatory Visit
Admission: RE | Admit: 2014-09-04 | Discharge: 2014-09-04 | Disposition: A | Discharge status: Home / Self Care | Payer: BLUE CROSS/BLUE SHIELD | Source: Ambulatory Visit | Attending: Family Medicine | Admitting: Family Medicine

## 2014-09-04 DIAGNOSIS — Z1231 Encounter for screening mammogram for malignant neoplasm of breast: Secondary | ICD-10-CM

## 2014-10-21 ENCOUNTER — Ambulatory Visit (INDEPENDENT_AMBULATORY_CARE_PROVIDER_SITE_OTHER): Payer: BLUE CROSS/BLUE SHIELD | Admitting: Diagnostic Neuroimaging

## 2014-10-21 ENCOUNTER — Encounter: Payer: Self-pay | Admitting: Diagnostic Neuroimaging

## 2014-10-21 VITALS — BP 100/72 | HR 56 | Ht 67.0 in | Wt 136.4 lb

## 2014-10-21 DIAGNOSIS — G40B09 Juvenile myoclonic epilepsy, not intractable, without status epilepticus: Secondary | ICD-10-CM

## 2014-10-21 MED ORDER — PHENOBARBITAL 97.2 MG PO TABS: 194.4000 mg | ORAL_TABLET | Freq: Every day | ORAL | Status: DC

## 2014-10-21 NOTE — Progress Notes (Signed)
[facility]  PATIENT: [name] DOB: 23-Jun-1971  REFERRING CLINICIAN:  HISTORY FROM: patient REASON FOR VISIT: follow up   HISTORICAL  CHIEF COMPLAINT:  Chief Complaint  Patient presents with  . Follow-up    epilepsy    HISTORY OF PRESENT ILLNESS:   UPDATE 10/21/14: Since last visit, no seizures. C/o daily headaches, and taking daily Tylenol. She has interrupted sleep at nighttime. She continues to drink 5-6 Dr. [name] cans per day. She eats once per day. She has increased stress related to work. Fortunately patient has been able to quit smoking cigarettes since 08/28/2014.  UPDATE 10/21/13: Since last visit, no new events or seizures. Now has [facility], but no PCP. Tolerating PB 97.2mg  tabs (2 tabs qAM).   UPDATE 05/29/12: No seizure. No PCP. No insurance.   UPDATE 12/22/10: No seizure since age 44's.  Tolerating PB, and wants to stay on it.  Having 1 migraine every 1-2 years (global, severe, pressure, photophobia, phonophobia).  Usually midrin helps.  Never tried triptan.  Also using tylenol for tension HA.  Last migraine Aug 2011.  PRIOR HPI (12/15/08, Dr. Gaynell Face): "[name] is a 44 year old married woman with a long-standing history of juvenile myoclonic epilepsy.  She has not had seizures for years.  The only manifestation of her symptoms has been generalized tonic-clonic seizures.  She has taken and tolerated Phenobarbital without significant side effects. Patient has daily headaches that I think are more tension type in nature than migraine.  Become on later in the day except when she has stress. Since her last visit, she had hysterectomy and oophorectomy for removal of an ovarian cyst.  She was noted to have scarred fallopian tubes at the time of operation."  REVIEW OF SYSTEMS: Full 14 system review of systems performed and notable only as per HPI.  ALLERGIES: Allergies  Allergen Reactions  . Asa Buff (Mag [Buffered Aspirin]   . Ibuprofen   .  Penicillins     HOME MEDICATIONS: Outpatient Prescriptions Prior to Visit  Medication Sig Dispense Refill  . PHENobarbital (LUMINAL) 97.2 MG tablet take 2 tablets by mouth daily 60 tablet 5  . isometheptene-acetaminophen-dichloralphenazone (MIDRIN) 65-325-100 MG capsule Take 2 capsules by mouth 4 (four) times daily as needed for migraine. Maximum 5 capsules in 12 hours for migraine headaches, 8 capsules in 24 hours for tension headaches.     No facility-administered medications prior to visit.    PAST MEDICAL HISTORY: Past Medical History  Diagnosis Date  . Migraine   . Seizure     PAST SURGICAL HISTORY: History reviewed. No pertinent past surgical history.  FAMILY HISTORY: Family History  Problem Relation Age of Onset  . Hypertension Mother     SOCIAL HISTORY:  History   Social History  . Marital Status: Married    Spouse Name: [name]  . Number of Children: 0  . Years of Education: 10th   Occupational History  .  [employer]   Social History Main Topics  . Smoking status: Former Smoker -- 1.00 packs/day    Types: Cigarettes  . Smokeless tobacco: Never Used     Comment: Quit Janurary 1st  . Alcohol Use: No  . Drug Use: No  . Sexual Activity: Not on file   Other Topics Concern  . Not on file   Social History Narrative   Patient lives at home with spouse.   Caffeine Use: 4-5 cups daily     PHYSICAL EXAM  Filed Vitals:   10/21/14 1400  BP: 100/72  Pulse: 56  Height: 5\' 7"  (1.702 m)  Weight: 136 lb 6.4 oz (61.871 kg)    Not recorded      Body mass index is 21.36 kg/(m^2).  GENERAL EXAM: Patient is in no distress; well developed, nourished and groomed; neck is supple; POOR DENTITION.  CARDIOVASCULAR: Regular rate and rhythm, no murmurs, no carotid bruits  NEUROLOGIC: MENTAL STATUS: awake, alert, language fluent, comprehension intact, naming intact, fund of knowledge appropriate CRANIAL NERVE: pupils equal and reactive to light, visual fields full  to confrontation, extraocular muscles intact, no nystagmus, facial sensation and strength symmetric, hearing intact, palate elevates symmetrically, uvula midline, shoulder shrug symmetric, tongue midline. MOTOR: normal bulk and tone, full strength in the BUE, BLE SENSORY: normal and symmetric to light touch COORDINATION: finger-nose-finger, fine finger movements normal REFLEXES: deep tendon reflexes present and symmetric GAIT/STATION: narrow based gait; romberg is negative    DIAGNOSTIC DATA (LABS, IMAGING, TESTING) - I reviewed patient records, labs, notes, testing and imaging myself where available.  Lab Results  Component Value Date   WBC 13.5* 06/15/2011   HGB 13.1 06/15/2011   HCT 38.9 06/15/2011   MCV 105.1* 06/15/2011   PLT 195 06/15/2011      Component Value Date/Time   NA 133* 06/15/2011 1800   K 3.9 06/15/2011 1800   CL 99 06/15/2011 1800   CO2 25 06/15/2011 1800   GLUCOSE 142* 06/15/2011 1800   BUN 6 06/15/2011 1800   CREATININE 0.48* 06/15/2011 1800   CALCIUM 8.6 06/15/2011 1800   GFRNONAA >90 06/15/2011 1800   GFRAA >90 06/15/2011 1800   No results found for: CHOL No results found for: HGBA1C No results found for: VITAMINB12 No results found for: TSH    ASSESSMENT AND PLAN  44 y.o. year old female here with seizure d/o (JME) since childhood, but no seizure since 44 years old.  Tolerating PB and wants to stay on it.  Also with chronic daily headache (tension) related to excess caffeine, poor nutrition, increased stress and poor sleep pattern.   PLAN: 1. Continue PB for sz control; I would prefer to switch her to LEV 500mg  BID, but she is reluctant; will address at next visit after headaches are better controlled 2. Advised patient better nutrition, sleep hygeine, caffeine reduction and daily exercise  Return in about 1 year (around 10/22/2015).    [name], MD 9/52/8413, 2:44 PM Certified in Neurology, Neurophysiology and  Neuroimaging  [facility] [address] [place], [place] [address] [phone]

## 2014-11-01 ENCOUNTER — Other Ambulatory Visit: Payer: Self-pay | Admitting: Diagnostic Neuroimaging

## 2014-11-05 ENCOUNTER — Other Ambulatory Visit: Payer: Self-pay | Admitting: Neurology

## 2014-11-05 ENCOUNTER — Telehealth: Payer: Self-pay | Admitting: Neurology

## 2014-11-05 NOTE — Telephone Encounter (Addendum)
Called by the patient husband that she has been having her migraine since this morning. The HA is her normal migraine and it rated at 10/10. She is asking for midrin for her migraine. She has taken 6 tylenols today. Midrin contains acetaminophen too and she will then be at risk to develop acetaminophen toxicity. Same as fioricet. She stated that she has not tried ibuprofen or naproxen before, however, when I attempted to order ibuprofen or naproxen for her, ibuprofen was showed up on her allergy list. Pt then stated that she has appointment with her regular doctor in am, she will ask for medications from her regular doctor. Telephone discussion concluded.  Rosalin Hawking, MD PhD Stroke Neurology 11/05/2014 7:53 PM

## 2015-05-05 ENCOUNTER — Other Ambulatory Visit: Payer: Self-pay | Admitting: Diagnostic Neuroimaging

## 2015-05-06 NOTE — Telephone Encounter (Signed)
Rx signed and faxed.

## 2015-10-22 ENCOUNTER — Encounter: Payer: Self-pay | Admitting: Diagnostic Neuroimaging

## 2015-10-22 ENCOUNTER — Ambulatory Visit (INDEPENDENT_AMBULATORY_CARE_PROVIDER_SITE_OTHER): Payer: BLUE CROSS/BLUE SHIELD | Admitting: Diagnostic Neuroimaging

## 2015-10-22 VITALS — BP 105/74 | HR 72 | Ht 67.0 in | Wt 156.0 lb

## 2015-10-22 DIAGNOSIS — R51 Headache: Secondary | ICD-10-CM | POA: Diagnosis not present

## 2015-10-22 DIAGNOSIS — G4710 Hypersomnia, unspecified: Secondary | ICD-10-CM

## 2015-10-22 DIAGNOSIS — G4700 Insomnia, unspecified: Secondary | ICD-10-CM | POA: Diagnosis not present

## 2015-10-22 DIAGNOSIS — T3995XA Adverse effect of unspecified nonopioid analgesic, antipyretic and antirheumatic, initial encounter: Secondary | ICD-10-CM

## 2015-10-22 DIAGNOSIS — R0683 Snoring: Secondary | ICD-10-CM

## 2015-10-22 DIAGNOSIS — G43009 Migraine without aura, not intractable, without status migrainosus: Secondary | ICD-10-CM

## 2015-10-22 DIAGNOSIS — R519 Headache, unspecified: Secondary | ICD-10-CM

## 2015-10-22 DIAGNOSIS — G4440 Drug-induced headache, not elsewhere classified, not intractable: Secondary | ICD-10-CM

## 2015-10-22 DIAGNOSIS — G44209 Tension-type headache, unspecified, not intractable: Secondary | ICD-10-CM | POA: Diagnosis not present

## 2015-10-22 DIAGNOSIS — G40B09 Juvenile myoclonic epilepsy, not intractable, without status epilepticus: Secondary | ICD-10-CM

## 2015-10-22 MED ORDER — AMITRIPTYLINE HCL 25 MG PO TABS: 25.0000 mg | ORAL_TABLET | Freq: Every day | ORAL | Status: DC

## 2015-10-22 NOTE — Progress Notes (Signed)
[facility]  PATIENT: [name] DOB: 07/26/1971  REFERRING CLINICIAN:  HISTORY FROM: patient REASON FOR VISIT: follow up   HISTORICAL  CHIEF COMPLAINT:  Chief Complaint  Patient presents with  . Juvenile myoclnic epilepsy    rm 4, "no seizure activity, just HA, migraines, take Tylenol prn"  . Follow-up    yearly    HISTORY OF PRESENT ILLNESS:   UPDATE 10/22/15: Since last visit, no seizures. Has cut down soda (now 1-2 per day), has quit smoking, and now exercising some. Still only eats 1 meal per day. Still taking 5-9 tabs tylenol on daily basis. Has 1 migraine per month (pounding global HA, sens to light / sound). Also with daily lower level headaches. Still poor sleep quality.   UPDATE 10/21/14: Since last visit, no seizures. C/o daily headaches, and taking daily Tylenol. She has interrupted sleep at nighttime. She continues to drink 5-6 Dr. Malachi Bowen cans per day. She eats once per day. She has increased stress related to work. Fortunately patient has been able to quit smoking cigarettes since 08/28/2014.  UPDATE 10/21/13: Since last visit, no new events or seizures. Now has [facility], but no PCP. Tolerating PB 97.2mg  tabs (2 tabs qAM).   UPDATE 05/29/12: No seizure. No PCP. No insurance.   UPDATE 12/22/10: No seizure since age 31's.  Tolerating PB, and wants to stay on it.  Having 1 migraine every 1-2 years (global, severe, pressure, photophobia, phonophobia).  Usually midrin helps.  Never tried triptan.  Also using tylenol for tension HA.  Last migraine Aug 2011.  PRIOR HPI (12/15/08, Dr. Gaynell Face): "[name] is a 45 year old married woman with a long-standing history of juvenile myoclonic epilepsy.  She has not had seizures for years.  The only manifestation of her symptoms has been generalized tonic-clonic seizures.  She has taken and tolerated Phenobarbital without significant side effects. Patient has daily headaches that I think are more tension type in  nature than migraine.  Become on later in the day except when she has stress. Since her last visit, she had hysterectomy and oophorectomy for removal of an ovarian cyst.  She was noted to have scarred fallopian tubes at the time of operation."  REVIEW OF SYSTEMS: Full 14 system review of systems performed and notable only as per HPI.  ALLERGIES: Allergies  Allergen Reactions  . Asa Buff (Mag [Buffered Aspirin]   . Ibuprofen     GI upset  . Penicillins     childhood    HOME MEDICATIONS: Outpatient Prescriptions Prior to Visit  Medication Sig Dispense Refill  . escitalopram (LEXAPRO) 5 MG tablet     . montelukast (SINGULAIR) 10 MG tablet     . PHENobarbital (LUMINAL) 97.2 MG tablet take 2 tablets by mouth once daily 60 tablet 5  . isometheptene-acetaminophen-dichloralphenazone (MIDRIN) 65-325-100 MG capsule Take 2 capsules by mouth 4 (four) times daily as needed for migraine. Maximum 5 capsules in 12 hours for migraine headaches, 8 capsules in 24 hours for tension headaches.     No facility-administered medications prior to visit.    PAST MEDICAL HISTORY: Past Medical History  Diagnosis Date  . Migraine   . Seizure ([place])     PAST SURGICAL HISTORY: History reviewed. No pertinent past surgical history.  FAMILY HISTORY: Family History  Problem Relation Age of Onset  . Hypertension Mother     SOCIAL HISTORY:  Social History   Social History  . Marital Status: Married    Spouse Name: [name]  .  Number of Children: 0  . Years of Education: 10th   Occupational History  .  [employer]   Social History Main Topics  . Smoking status: Former Smoker -- 1.00 packs/day    Types: Cigarettes  . Smokeless tobacco: Never Used     Comment: Quit Janurary 1st  . Alcohol Use: No  . Drug Use: No  . Sexual Activity: Not on file   Other Topics Concern  . Not on file   Social History Narrative   Patient lives at home with spouse.   Caffeine Use: 4-5 cups daily     PHYSICAL  EXAM  Filed Vitals:   10/22/15 1114  BP: 105/74  Pulse: 72  Height: 5\' 7"  (1.702 m)  Weight: 156 lb (70.761 kg)    Not recorded      Body mass index is 24.43 kg/(m^2).  GENERAL EXAM: Patient is in no distress; well developed, nourished and groomed; neck is supple; POOR DENTITION.  CARDIOVASCULAR: Regular rate and rhythm, no murmurs, no carotid bruits  NEUROLOGIC: MENTAL STATUS: awake, alert, language fluent, comprehension intact, naming intact, fund of knowledge appropriate CRANIAL NERVE: pupils equal and reactive to light, visual fields full to confrontation, extraocular muscles intact, no nystagmus, facial sensation and strength symmetric, hearing intact, palate elevates symmetrically, uvula midline, shoulder shrug symmetric, tongue midline. MOTOR: normal bulk and tone, full strength in the BUE, BLE SENSORY: normal and symmetric to light touch COORDINATION: finger-nose-finger, fine finger movements normal REFLEXES: deep tendon reflexes present and symmetric GAIT/STATION: narrow based gait; romberg is negative    DIAGNOSTIC DATA (LABS, IMAGING, TESTING) - I reviewed patient records, labs, notes, testing and imaging myself where available.  Lab Results  Component Value Date   WBC 13.5* 06/15/2011   HGB 13.1 06/15/2011   HCT 38.9 06/15/2011   MCV 105.1* 06/15/2011   PLT 195 06/15/2011      Component Value Date/Time   NA 133* 06/15/2011 1800   K 3.9 06/15/2011 1800   CL 99 06/15/2011 1800   CO2 25 06/15/2011 1800   GLUCOSE 142* 06/15/2011 1800   BUN 6 06/15/2011 1800   CREATININE 0.48* 06/15/2011 1800   CALCIUM 8.6 06/15/2011 1800   GFRNONAA >90 06/15/2011 1800   GFRAA >90 06/15/2011 1800   No results found for: CHOL No results found for: HGBA1C No results found for: VITAMINB12 No results found for: TSH    ASSESSMENT AND PLAN  45 y.o. year old female here with seizure d/o (JME) since childhood, but no seizure since 45 years old.  Tolerating PB and wants  to stay on it.  Also with chronic daily headache (tension) related to excess caffeine, poor nutrition, increased stress and poor sleep pattern.   Dx:  Juvenile myoclonic epilepsy, not intractable, without status epilepticus ([place])  Migraine without aura and without status migrainosus, not intractable  Analgesic overuse headache  Chronic daily headache  Tension headache  Snoring  Insomnia  Excessive sleepiness    PLAN:  JME/SEIZURES: - Continue phenobarbital 97.2mg  (2 tabs daily) for seizure control; I would prefer to switch her to LEV 500mg  BID, but she is reluctant; will address again in future visit  HEADACHES: - check MRI, labs, sleep study for increasing headache evaluation, snoring, fatigue - encouraged better nutrition, sleep hygeine, caffeine reduction and daily exercise - taper off tylenol (use only 5-10 tabs per month as a goal) - start amitriptyline 25mg  at bedtime  Orders Placed This Encounter  Procedures  . MR Brain W Wo Contrast  .  CBC with Differential/Platelet  . Comprehensive metabolic panel  . Hemoglobin A1c  . TSH  . T4, Free  . Ambulatory referral to Sleep Studies   Meds ordered this encounter  Medications  . amitriptyline (ELAVIL) 25 MG tablet    Sig: Take 1 tablet (25 mg total) by mouth at bedtime.    Dispense:  30 tablet    Refill:  6   Return in about 3 months (around 01/19/2016).    [name], MD [id number], [id number] AM Certified in Neurology, Neurophysiology and Neuroimaging  [facility] [address] [place], [place] [address] [phone]

## 2015-10-22 NOTE — Patient Instructions (Addendum)
Thank you for coming to see [place] at [facility]. I hope we have been able to provide you high quality care today.  You may receive a patient satisfaction survey over the next few weeks. We would appreciate your feedback and comments so that we may continue to improve ourselves and the health of our patients.  - try to eat 3 meals per day - continue exercise - taper off tylenol (use only 5-10 tabs per month as a goal) - start amitriptyline 61m at bedtime - continue phenobarbital - I will check MRI brain, labs, sleep study   ~~~~~~~~~~~~~~~~~~~~~~~~~~~~~~~~~~~~~~~~~~~~~~~~~~~~~~~~~~~~~~~~~  DR. Lelani Garnett'S GUIDE TO HAPPY AND HEALTHY LIVING These are some of my general health and wellness recommendations. Some of them may apply to you better than others. Please use common sense as you try these suggestions and feel free to ask me any questions.   ACTIVITY/FITNESS Mental, social, emotional and physical stimulation are very important for brain and body health. Try learning a new activity (arts, music, language, sports, games).  Keep moving your body to the best of your abilities. You can do this at home, inside or outside, the park, community center, gym or anywhere you like. Consider a physical therapist or personal trainer to get started. Consider the app Sworkit. Fitness trackers such as smart-watches, smart-phones or Fitbits can help as well.   NUTRITION Eat more plants: colorful vegetables, nuts, seeds and berries.  Eat less sugar, salt, preservatives and processed foods.  Avoid toxins such as cigarettes and alcohol.  Drink water when you are thirsty. Warm water with a slice of lemon is an excellent morning drink to start the day.  Consider these websites for more information The Nutrition Source (hhttps://www.henry-hernandez.biz/ [employer] Nutrition (wWindowBlog.ch   RELAXATION Consider practicing mindfulness  meditation or other relaxation techniques such as deep breathing, prayer, yoga, tai chi, massage. See website mindful.org or the apps Headspace or Calm to help get started.   SLEEP Try to get at least 7-8+ hours sleep per day. Regular exercise and reduced caffeine will help you sleep better. Practice good sleep hygeine techniques. See website sleep.org for more information.   PLANNING Prepare estate planning, living will, healthcare POA documents. Sometimes this is best planned with the help of an attorney. Theconversationproject.org and agingwithdignity.org are excellent resources.

## 2015-10-23 LAB — COMPREHENSIVE METABOLIC PANEL
ALT: 14 IU/L (ref 0–32)
AST: 13 IU/L (ref 0–40)
Albumin/Globulin Ratio: 1.7 (ref 1.1–2.5)
Albumin: 4.1 g/dL (ref 3.5–5.5)
Alkaline Phosphatase: 59 IU/L (ref 39–117)
BUN/Creatinine Ratio: 15 (ref 9–23)
BUN: 9 mg/dL (ref 6–24)
Bilirubin Total: 0.2 mg/dL (ref 0.0–1.2)
CO2: 26 mmol/L (ref 18–29)
Calcium: 9 mg/dL (ref 8.7–10.2)
Chloride: 103 mmol/L (ref 96–106)
Creatinine, Ser: 0.59 mg/dL (ref 0.57–1.00)
GFR calc Af Amer: 127 mL/min/{1.73_m2} (ref >59)
GFR calc non Af Amer: 110 mL/min/{1.73_m2} (ref >59)
Globulin, Total: 2.4 g/dL (ref 1.5–4.5)
Glucose: 80 mg/dL (ref 65–99)
Potassium: 4.9 mmol/L (ref 3.5–5.2)
Sodium: 140 mmol/L (ref 134–144)
Total Protein: 6.5 g/dL (ref 6.0–8.5)

## 2015-10-23 LAB — TSH: TSH: 2.07 u[IU]/mL (ref 0.450–4.500)

## 2015-10-23 LAB — CBC WITH DIFFERENTIAL/PLATELET
Basophils Absolute: 0 10*3/uL (ref 0.0–0.2)
Basos: 1 %
EOS (ABSOLUTE): 0.2 10*3/uL (ref 0.0–0.4)
Eos: 3 %
Hematocrit: 40.9 % (ref 34.0–46.6)
Hemoglobin: 14.4 g/dL (ref 11.1–15.9)
Immature Grans (Abs): 0 10*3/uL (ref 0.0–0.1)
Immature Granulocytes: 0 %
Lymphocytes Absolute: 1.8 10*3/uL (ref 0.7–3.1)
Lymphs: 33 %
MCH: 33.5 pg — ABNORMAL HIGH (ref 26.6–33.0)
MCHC: 35.2 g/dL (ref 31.5–35.7)
MCV: 95 fL (ref 79–97)
Monocytes Absolute: 0.6 10*3/uL (ref 0.1–0.9)
Monocytes: 10 %
Neutrophils Absolute: 3 10*3/uL (ref 1.4–7.0)
Neutrophils: 53 %
Platelets: 221 10*3/uL (ref 150–379)
RBC: 4.3 x10E6/uL (ref 3.77–5.28)
RDW: 13 % (ref 12.3–15.4)
WBC: 5.5 10*3/uL (ref 3.4–10.8)

## 2015-10-23 LAB — HEMOGLOBIN A1C
Est. average glucose Bld gHb Est-mCnc: 108 mg/dL
Hgb A1c MFr Bld: 5.4 % (ref 4.8–5.6)

## 2015-10-23 LAB — T4, FREE: Free T4: 0.84 ng/dL (ref 0.82–1.77)

## 2015-10-26 ENCOUNTER — Encounter: Payer: Self-pay | Admitting: Neurology

## 2015-10-26 ENCOUNTER — Ambulatory Visit (INDEPENDENT_AMBULATORY_CARE_PROVIDER_SITE_OTHER): Payer: BLUE CROSS/BLUE SHIELD | Admitting: Neurology

## 2015-10-26 VITALS — BP 122/78 | HR 72 | Resp 16 | Ht 67.0 in | Wt 157.0 lb

## 2015-10-26 DIAGNOSIS — G4719 Other hypersomnia: Secondary | ICD-10-CM | POA: Diagnosis not present

## 2015-10-26 DIAGNOSIS — R635 Abnormal weight gain: Secondary | ICD-10-CM

## 2015-10-26 DIAGNOSIS — R519 Headache, unspecified: Secondary | ICD-10-CM

## 2015-10-26 DIAGNOSIS — G478 Other sleep disorders: Secondary | ICD-10-CM | POA: Diagnosis not present

## 2015-10-26 DIAGNOSIS — R0683 Snoring: Secondary | ICD-10-CM | POA: Diagnosis not present

## 2015-10-26 DIAGNOSIS — R51 Headache: Secondary | ICD-10-CM

## 2015-10-26 NOTE — Patient Instructions (Signed)
Based on your symptoms and your exam I believe you are at risk for obstructive sleep apnea or OSA, and I think we should proceed with a sleep study to determine whether you do or do not have OSA and how severe it is. If you have more than mild OSA, I want you to consider treatment with CPAP. Please remember, the risks and ramifications of moderate to severe obstructive sleep apnea or OSA are: Cardiovascular disease, including congestive heart failure, stroke, difficult to control hypertension, arrhythmias, and even type 2 diabetes has been linked to untreated OSA. Sleep apnea causes disruption of sleep and sleep deprivation in most cases, which, in turn, can cause recurrent headaches, problems with memory, mood, concentration, focus, and vigilance. Most people with untreated sleep apnea report excessive daytime sleepiness, which can affect their ability to drive. Please do not drive if you feel sleepy.   I will likely see you back after your sleep study to go over the test results and where to go from there. We will call you after your sleep study to advise about the results (most likely, you will hear from Diana, my nurse) and to set up an appointment at the time, as necessary.    Our sleep lab administrative assistant, Mee will meet with you or call you to schedule your sleep study. If you don't hear back from her by next week please feel free to call her at [phone]. This is her direct line and please leave a message with your phone number to call back if you get the voicemail box. She will call back as soon as possible.   

## 2015-10-26 NOTE — Progress Notes (Signed)
Subjective:    Patient ID: [name] is a 45 y.o. female.  HPI     [name], MD, PhD [facility] [address]O. [place], [place] [address]  Dear [name],   I saw your patient, [name], upon your kind request in my clinic today for initial consultation of her sleep disorder, in particular, concern for underlying obstructive sleep apnea. The patient is accompanied by her husband today. As you know, [name] is a 45 year old right-handed woman with an underlying medical history of migraines, and juvenile myoclonic epilepsy, who reports snoring and excessive daytime somnolence, sleep disruption and nonrestorative sleep. I reviewed your office note from 10/22/2015. She has been on phenobarbital for years. She was recently started on amitriptyline for headaches. She takes 25 mg each night and it has helped some.  She stopped smoking in January 2016, since then, she states, she has gained about 30 lb.  Snoring can be allowed per husband. She denies morning headaches or nocturia. She denies restless leg symptoms but is a restless sleeper. Her Epworth sleepiness score is 16 out of 24 today, her fatigue score is 48 out of 63. She works as a Teacher, English as a foreign language at [employer]. Her usual work schedule is 7 to 4 and sometimes on weekends she has to work from 4 AM to 1 PM. Bedtime is usually between 9 and 10 PM and she does not typically get to bed earlier when she has to be up early for weekend shifts. Rise time typically is 5 to 5:30 AM. On weekends sometimes she has to be up at 2:30 AM to be at work at 4 AM. She denies a family history of OSA. She has multiple middle of the night awakenings, more than 3 times on an average night. She has some trouble going back to sleep. She does not drink alcohol or use illicit drugs. She drinks one can of soda per day. She lives at home with her husband. They have no pets, no children.  Her Past Medical History Is  Significant For: Past Medical History  Diagnosis Date  . Migraine   . Seizure [facility])     Her Past Surgical History Is Significant For: Past Surgical History  Procedure Laterality Date  . Abdominal hysterectomy  2012    Her Family History Is Significant For: Family History  Problem Relation Age of Onset  . Hypertension Mother     Her Social History Is Significant For: Social History   Social History  . Marital Status: Married    Spouse Name: [name]  . Number of Children: 0  . Years of Education: 10th   Occupational History  .  [employer]   Social History Main Topics  . Smoking status: Former Smoker -- 1.00 packs/day    Types: Cigarettes  . Smokeless tobacco: Never Used     Comment: Quit Janurary 1st  . Alcohol Use: No  . Drug Use: No  . Sexual Activity: Not Asked   Other Topics Concern  . None   Social History Narrative   Patient lives at home with spouse.   Caffeine Use: 4-5 cups daily    Her Allergies Are:  Allergies  Allergen Reactions  . Asa Buff (Mag [Buffered Aspirin]   . Ibuprofen     GI upset  . Penicillins     childhood  :   Her Current Medications Are:  Outpatient Encounter Prescriptions as of 10/26/2015  Medication Sig  . amitriptyline (ELAVIL) 25  MG tablet Take 1 tablet (25 mg total) by mouth at bedtime.  Marland Kitchen escitalopram (LEXAPRO) 10 MG tablet 10 mg. Takes total 15 mg daily  . escitalopram (LEXAPRO) 5 MG tablet   . montelukast (SINGULAIR) 10 MG tablet   . PHENobarbital (LUMINAL) 97.2 MG tablet take 2 tablets by mouth once daily   No facility-administered encounter medications on file as of 10/26/2015.  :  Review of Systems:  Out of a complete 14 point review of systems, all are reviewed and negative with the exception of these symptoms as listed below:  Review of Systems  Neurological:       Patient reports trouble staying asleep, snoring, wakes up feeling tired, headaches, daytime tiredness, denies taking naps.    Epworth Sleepiness  Scale 0= would never doze 1= slight chance of dozing 2= moderate chance of dozing 3= high chance of dozing  Sitting and reading:3 Watching TV:3 Sitting inactive in a public place (ex. Theater or meeting):3 As a passenger in a car for an hour without a break:2 Lying down to rest in the afternoon:3 Sitting and talking to someone:1 Sitting quietly after lunch (no alcohol):1 In a car, while stopped in traffic:0 Total:16  Objective:  Neurologic Exam  Physical Exam Physical Examination:   Filed Vitals:   10/26/15 1549  BP: 122/78  Pulse: 72  Resp: 16   General Examination: The patient is a very pleasant 45 y.o. female in no acute distress. She appears well-developed and well-nourished and adequately groomed.   HEENT: Normocephalic, atraumatic, pupils are equal, round and reactive to light and accommodation. Funduscopic exam is normal with sharp disc margins noted. Extraocular tracking is good without limitation to gaze excursion or nystagmus noted. Normal smooth pursuit is noted. Hearing is grossly intact. Tympanic membranes are clear bilaterally. Face is symmetric with normal facial animation and normal facial sensation. Speech is clear with no dysarthria noted. There is no hypophonia. There is no lip, neck/head, jaw or voice tremor. Neck is supple with full range of passive and active motion. There are no carotid bruits on auscultation. Oropharynx exam reveals: moderate mouth dryness, poor dental hygiene with several missing teeth and moderate airway crowding, due to narrow airway entry, thicker soft palate, redundant soft palate and tonsils in place. Mallampati is class II. Tongue protrudes centrally and palate elevates symmetrically. Tonsils are 1+ to 2+ in size. Neck size is 14 inches. She has a Mild overbite.    Chest: Clear to auscultation without wheezing, rhonchi or crackles noted.  Heart: S1+S2+0, regular and normal without murmurs, rubs or gallops noted.   Abdomen: Soft,  non-tender and non-distended with normal bowel sounds appreciated on auscultation.  Extremities: There is trace pitting edema in the distal lower extremities bilaterally. Pedal pulses are intact.  Skin: Warm and dry without trophic changes noted. There are no varicose veins.  Musculoskeletal: exam reveals no obvious joint deformities, tenderness or joint swelling or erythema.   Neurologically:  Mental status: The patient is awake, alert and oriented in all 4 spheres. Her immediate and remote memory, attention, language skills and fund of knowledge are appropriate. There is no evidence of aphasia, agnosia, apraxia or anomia. Speech is clear with normal prosody and enunciation. Thought process is linear. Mood is normal and affect is normal.  Cranial nerves II - XII are as described above under HEENT exam. In addition: shoulder shrug is normal with equal shoulder height noted. Motor exam: Normal bulk, strength and tone is noted. There is no drift, tremor or  rebound. Romberg is negative. Reflexes are 2+ throughout. Fine motor skills and coordination: intact with normal finger taps, normal hand movements, normal rapid alternating patting, normal foot taps and normal foot agility.  Cerebellar testing: No dysmetria or intention tremor on finger to nose testing. Heel to shin is unremarkable bilaterally. There is no truncal or gait ataxia.  Sensory exam: intact to light touch in the upper and lower extremities.  Gait, station and balance: She stands easily. No veering to one side is noted. No leaning to one side is noted. Posture is age-appropriate and stance is narrow based. Gait shows normal stride length and normal pace. No problems turning are noted. She turns en bloc. Tandem walk is unremarkable.   Assessment and Plan:   In summary, [name] is a very pleasant 45 y.o.-year old female with an underlying medical history of migraines, and juvenile myoclonic epilepsy, who reports snoring and  excessive daytime somnolence, sleep disruption and nonrestorative sleep. Her history and physical exam are concerning for underlying obstructive sleep apnea (OSA). I had a long chat with the patient and her husband about my findings and the diagnosis of OSA, its prognosis and treatment options. We talked about medical treatments, surgical interventions and non-pharmacological approaches. I explained in particular the risks and ramifications of untreated moderate to severe OSA, especially with respect to developing cardiovascular disease down the Road, including congestive heart failure, difficult to treat hypertension, cardiac arrhythmias, or stroke. Even type 2 diabetes has, in part, been linked to untreated OSA. Symptoms of untreated OSA include daytime sleepiness, memory problems, mood irritability and mood disorder such as depression and anxiety, lack of energy, as well as recurrent headaches, especially morning headaches. We talked about trying to maintain a healthy lifestyle in general, as well as the importance of weight control. I encouraged the patient to eat healthy, exercise daily and keep well hydrated, to keep a scheduled bedtime and wake time routine, to not skip any meals and eat healthy snacks in between meals. I advised the patient not to drive when feeling sleepy. I recommended the following at this time: sleep study with potential positive airway pressure titration. (We will score hypopneas at 3% and split the sleep study into diagnostic and treatment portion, if the estimated. 2 hour AHI is >15/h).   I explained the sleep test procedure to the patient and also outlined possible surgical and non-surgical treatment options of OSA, including the use of a custom-made dental device (which would require a referral to a specialist dentist or oral surgeon), upper airway surgical options, such as pillar implants, radiofrequency surgery, tongue base surgery, and UPPP (which would involve a referral to  an ENT surgeon). Rarely, jaw surgery such as mandibular advancement may be considered.  I also explained the CPAP treatment option to the patient, who indicated that she would be willing to try CPAP if the need arises. I explained the importance of being compliant with PAP treatment, not only for insurance purposes but primarily to improve Her symptoms, and for the patient's long term health benefit, including to reduce Her cardiovascular risks. I answered all their questions today and the patient her husband were in agreement. I would like to see her back after the sleep study is completed and encouraged her to call with any interim questions, concerns, problems or updates.   Thank you very much for allowing me to participate in the care of this nice patient. If I can be of any further assistance to you please do  not hesitate to talk to me.   Sincerely,   [name], MD, PhD

## 2015-11-03 ENCOUNTER — Other Ambulatory Visit: Payer: Self-pay | Admitting: Diagnostic Neuroimaging

## 2015-11-18 ENCOUNTER — Ambulatory Visit
Admission: RE | Admit: 2015-11-18 | Discharge: 2015-11-18 | Disposition: A | Discharge status: Home / Self Care | Payer: BLUE CROSS/BLUE SHIELD | Source: Ambulatory Visit | Attending: Diagnostic Neuroimaging | Admitting: Diagnostic Neuroimaging

## 2015-11-18 DIAGNOSIS — G40B09 Juvenile myoclonic epilepsy, not intractable, without status epilepticus: Secondary | ICD-10-CM

## 2015-11-18 DIAGNOSIS — G43009 Migraine without aura, not intractable, without status migrainosus: Secondary | ICD-10-CM

## 2015-11-18 DIAGNOSIS — G4710 Hypersomnia, unspecified: Secondary | ICD-10-CM

## 2015-11-18 DIAGNOSIS — G44209 Tension-type headache, unspecified, not intractable: Secondary | ICD-10-CM

## 2015-11-18 DIAGNOSIS — G4440 Drug-induced headache, not elsewhere classified, not intractable: Secondary | ICD-10-CM

## 2015-11-18 DIAGNOSIS — R51 Headache: Secondary | ICD-10-CM

## 2015-11-18 DIAGNOSIS — G4700 Insomnia, unspecified: Secondary | ICD-10-CM

## 2015-11-18 DIAGNOSIS — R519 Headache, unspecified: Secondary | ICD-10-CM

## 2015-11-18 DIAGNOSIS — T3995XA Adverse effect of unspecified nonopioid analgesic, antipyretic and antirheumatic, initial encounter: Secondary | ICD-10-CM

## 2015-11-18 DIAGNOSIS — R0683 Snoring: Secondary | ICD-10-CM

## 2015-11-18 MED ORDER — GADOBENATE DIMEGLUMINE 529 MG/ML IV SOLN
14.0000 mL | Freq: Once | INTRAVENOUS | Status: AC | PRN
  Administered 2015-11-18: 14 mL via INTRAVENOUS

## 2015-11-23 ENCOUNTER — Telehealth: Payer: Self-pay | Admitting: *Deleted

## 2015-11-23 NOTE — Telephone Encounter (Signed)
Per Dr Leta Baptist, informed patient her MRI brain is normal. She verbalized understanding, appreciation.

## 2015-11-24 ENCOUNTER — Other Ambulatory Visit: Payer: Self-pay

## 2015-11-24 DIAGNOSIS — Z1231 Encounter for screening mammogram for malignant neoplasm of breast: Secondary | ICD-10-CM

## 2015-12-15 ENCOUNTER — Ambulatory Visit: Payer: BLUE CROSS/BLUE SHIELD

## 2015-12-24 ENCOUNTER — Ambulatory Visit (INDEPENDENT_AMBULATORY_CARE_PROVIDER_SITE_OTHER): Payer: BLUE CROSS/BLUE SHIELD | Admitting: Diagnostic Neuroimaging

## 2015-12-24 ENCOUNTER — Encounter: Payer: Self-pay | Admitting: Diagnostic Neuroimaging

## 2015-12-24 VITALS — BP 106/78 | HR 70 | Resp 14 | Wt 157.6 lb

## 2015-12-24 DIAGNOSIS — G43009 Migraine without aura, not intractable, without status migrainosus: Secondary | ICD-10-CM

## 2015-12-24 DIAGNOSIS — G4719 Other hypersomnia: Secondary | ICD-10-CM

## 2015-12-24 DIAGNOSIS — G4440 Drug-induced headache, not elsewhere classified, not intractable: Secondary | ICD-10-CM | POA: Diagnosis not present

## 2015-12-24 DIAGNOSIS — T3995XA Adverse effect of unspecified nonopioid analgesic, antipyretic and antirheumatic, initial encounter: Secondary | ICD-10-CM

## 2015-12-24 DIAGNOSIS — G4700 Insomnia, unspecified: Secondary | ICD-10-CM | POA: Diagnosis not present

## 2015-12-24 DIAGNOSIS — R569 Unspecified convulsions: Secondary | ICD-10-CM | POA: Insufficient documentation

## 2015-12-24 DIAGNOSIS — G40B09 Juvenile myoclonic epilepsy, not intractable, without status epilepticus: Secondary | ICD-10-CM

## 2015-12-24 MED ORDER — RIZATRIPTAN BENZOATE 10 MG PO TBDP: 10.0000 mg | ORAL_TABLET | ORAL | Status: DC | PRN

## 2015-12-24 MED ORDER — PHENOBARBITAL 97.2 MG PO TABS: 194.4000 mg | ORAL_TABLET | Freq: Every day | ORAL | Status: DC

## 2015-12-24 MED ORDER — AMITRIPTYLINE HCL 25 MG PO TABS: 25.0000 mg | ORAL_TABLET | Freq: Every day | ORAL | Status: DC

## 2015-12-24 NOTE — Patient Instructions (Signed)
Thank you for coming to see [place] at [facility]. I hope we have been able to provide you high quality care today.  You may receive a patient satisfaction survey over the next few weeks. We would appreciate your feedback and comments so that we may continue to improve ourselves and the health of our patients.  JME/SEIZURES - Continue phenobarbital 97.62m (2 tabs daily) for seizure control; I would prefer to reduce phenobarbital to 97.2 mg daily and add topiramate to help with seizure and migraine control  HEADACHES - follow up sleep study for increasing headache evaluation, snoring, fatigue - encouraged better nutrition, sleep hygeine, caffeine reduction and daily exercise - taper off tylenol (use only 5-10 tabs per month as a goal) - continue amitriptyline 23mat bedtime - rizatriptan 1047ms needed for breakthrough headache; may repeat x 1 after 2 hours; max 2 tabs per day or 8 per month   DENTAL HYGEINE - follow up with dentist  STRESS/ANXIETY - continue lexapro   ~~~~~~~~~~~~~~~~~~~~~~~~~~~~~~~~~~~~~~~~~~~~~~~~~~~~~~~~~~~~~~~~~  DR. Saron Vanorman'S GUIDE TO HAPPY AND HEALTHY LIVING These are some of my general health and wellness recommendations. Some of them may apply to you better than others. Please use common sense as you try these suggestions and feel free to ask me any questions.   ACTIVITY/FITNESS Mental, social, emotional and physical stimulation are very important for brain and body health. Try learning a new activity (arts, music, language, sports, games).  Keep moving your body to the best of your abilities. You can do this at home, inside or outside, the park, community center, gym or anywhere you like. Consider a physical therapist or personal trainer to get started. Consider the app Sworkit. Fitness trackers such as smart-watches, smart-phones or Fitbits can help as well.   NUTRITION Eat more plants: colorful vegetables, nuts, seeds and  berries.  Eat less sugar, salt, preservatives and processed foods.  Avoid toxins such as cigarettes and alcohol.  Drink water when you are thirsty. Warm water with a slice of lemon is an excellent morning drink to start the day.  Consider these websites for more information The Nutrition Source (htthttps://www.henry-hernandez.biz/recision Nutrition (wwwWindowBlog.ch RELAXATION Consider practicing mindfulness meditation or other relaxation techniques such as deep breathing, prayer, yoga, tai chi, massage. See website mindful.org or the apps Headspace or Calm to help get started.   SLEEP Try to get at least 7-8+ hours sleep per day. Regular exercise and reduced caffeine will help you sleep better. Practice good sleep hygeine techniques. See website sleep.org for more information.   PLANNING Prepare estate planning, living will, healthcare POA documents. Sometimes this is best planned with the help of an attorney. Theconversationproject.org and agingwithdignity.org are excellent resources.

## 2015-12-24 NOTE — Progress Notes (Signed)
[facility]  PATIENT: [name] DOB: 12/05/1970  REFERRING CLINICIAN:  HISTORY FROM: patient REASON FOR VISIT: follow up   HISTORICAL  CHIEF COMPLAINT:  Chief Complaint  Patient presents with  . Seizures    Denies sz. activity since age 45.  Sts. is compliant with Phenobarb.  Sts. h/a's are same frequency, same severity.  Sts. she continues to take about 3,000mg  Tylenol daily/fim  . Headaches    HISTORY OF PRESENT ILLNESS:   UPDATE 12/24/15: Since last visit, no seizures. Still with daily headaches. Also with 1 severe migraine per month. Eating 2 x per day. Soda (1 per day). Still with 6 tylenol tabs per day. Amitriptyline helped some in the beginning, but seems to be less effective now.   UPDATE 10/22/15: Since last visit, no seizures. Has cut down soda (now 1-2 per day), has quit smoking, and now exercising some. Still only eats 1 meal per day. Still taking 5-9 tabs tylenol on daily basis. Has 1 migraine per month (pounding global HA, sens to light / sound). Also with daily lower level headaches. Still poor sleep quality.   UPDATE 10/21/14: Since last visit, no seizures. C/o daily headaches, and taking daily Tylenol. She has interrupted sleep at nighttime. She continues to drink 5-6 Dr. [name] cans per day. She eats once per day. She has increased stress related to work. Fortunately patient has been able to quit smoking cigarettes since 08/28/2014.  UPDATE 10/21/13: Since last visit, no new events or seizures. Now has [facility], but no PCP. Tolerating PB 97.2mg  tabs (2 tabs qAM).   UPDATE 05/29/12: No seizure. No PCP. No insurance.   UPDATE 12/22/10: No seizure since age 27's.  Tolerating PB, and wants to stay on it.  Having 1 migraine every 1-2 years (global, severe, pressure, photophobia, phonophobia).  Usually midrin helps.  Never tried triptan.  Also using tylenol for tension HA.  Last migraine Aug 2011.  PRIOR HPI (12/15/08, Dr. Gaynell Face): "Junior is a  45 year old married woman with a long-standing history of juvenile myoclonic epilepsy.  She has not had seizures for years.  The only manifestation of her symptoms has been generalized tonic-clonic seizures.  She has taken and tolerated Phenobarbital without significant side effects. Patient has daily headaches that I think are more tension type in nature than migraine.  Become on later in the day except when she has stress. Since her last visit, she had hysterectomy and oophorectomy for removal of an ovarian cyst.  She was noted to have scarred fallopian tubes at the time of operation."   REVIEW OF SYSTEMS: Full 14 system review of systems performed and notable only as per HPI.  ALLERGIES: Allergies  Allergen Reactions  . Asa Buff (Mag [Buffered Aspirin]   . Ibuprofen     GI upset  . Penicillins     childhood    HOME MEDICATIONS: Outpatient Prescriptions Prior to Visit  Medication Sig Dispense Refill  . amitriptyline (ELAVIL) 25 MG tablet Take 1 tablet (25 mg total) by mouth at bedtime. 30 tablet 6  . PHENobarbital (LUMINAL) 97.2 MG tablet TAKE TWO TABLETS BY MOUTH ONCE DAILY 60 tablet 1  . escitalopram (LEXAPRO) 10 MG tablet 10 mg. Takes total 15 mg daily    . escitalopram (LEXAPRO) 5 MG tablet     . montelukast (SINGULAIR) 10 MG tablet      No facility-administered medications prior to visit.    PAST MEDICAL HISTORY: Past Medical History  Diagnosis Date  .  Migraine   . Seizure ([place])     PAST SURGICAL HISTORY: Past Surgical History  Procedure Laterality Date  . Abdominal hysterectomy  2012    FAMILY HISTORY: Family History  Problem Relation Age of Onset  . Hypertension Mother     SOCIAL HISTORY:  Social History   Social History  . Marital Status: Married    Spouse Name: [name]  . Number of Children: 0  . Years of Education: 10th   Occupational History  .  [employer]   Social History Main Topics  . Smoking status: Former Smoker -- 1.00 packs/day    Types:  Cigarettes  . Smokeless tobacco: Never Used     Comment: Quit Janurary 1st  . Alcohol Use: No  . Drug Use: No  . Sexual Activity: Not on file   Other Topics Concern  . Not on file   Social History Narrative   Patient lives at home with spouse.   Caffeine Use: 4-5 cups daily     PHYSICAL EXAM  Filed Vitals:   12/24/15 0952  BP: 106/78  Pulse: 70  Resp: 14  Weight: 157 lb 9.6 oz (71.487 kg)    Not recorded      Body mass index is 24.68 kg/(m^2).  GENERAL EXAM: Patient is in no distress; well developed, nourished and groomed; neck is supple; POOR DENTITION.  CARDIOVASCULAR: Regular rate and rhythm, no murmurs, no carotid bruits  NEUROLOGIC: MENTAL STATUS: awake, alert, language fluent, comprehension intact, naming intact, fund of knowledge appropriate CRANIAL NERVE: pupils equal and reactive to light, visual fields full to confrontation, extraocular muscles intact, no nystagmus, facial sensation and strength symmetric, hearing intact, palate elevates symmetrically, uvula midline, shoulder shrug symmetric, tongue midline. MOTOR: normal bulk and tone, full strength in the BUE, BLE SENSORY: normal and symmetric to light touch COORDINATION: finger-nose-finger, fine finger movements normal REFLEXES: deep tendon reflexes present and symmetric GAIT/STATION: narrow based gait; romberg is negative    DIAGNOSTIC DATA (LABS, IMAGING, TESTING) - I reviewed patient records, labs, notes, testing and imaging myself where available.  Lab Results  Component Value Date   WBC 5.5 10/22/2015   HGB 13.1 06/15/2011   HCT 40.9 10/22/2015   MCV 95 10/22/2015   PLT 221 10/22/2015      Component Value Date/Time   NA 140 10/22/2015 1206   NA 133* 06/15/2011 1800   K 4.9 10/22/2015 1206   CL 103 10/22/2015 1206   CO2 26 10/22/2015 1206   GLUCOSE 80 10/22/2015 1206   GLUCOSE 142* 06/15/2011 1800   BUN 9 10/22/2015 1206   BUN 6 06/15/2011 1800   CREATININE 0.59 10/22/2015 1206    CALCIUM 9.0 10/22/2015 1206   PROT 6.5 10/22/2015 1206   ALBUMIN 4.1 10/22/2015 1206   AST 13 10/22/2015 1206   ALT 14 10/22/2015 1206   ALKPHOS 59 10/22/2015 1206   BILITOT 0.2 10/22/2015 1206   GFRNONAA 110 10/22/2015 1206   GFRAA 127 10/22/2015 1206   No results found for: CHOL Lab Results  Component Value Date   HGBA1C 5.4 10/22/2015   No results found for: VITAMINB12 Lab Results  Component Value Date   TSH 2.070 10/22/2015    11/18/15 MRI brain [I reviewed images myself and agree with interpretation. -VRP]  - normal     ASSESSMENT AND PLAN  45 y.o. year old female here with seizure d/o (JME) since childhood, but no seizure since 45 years old.  Tolerating PB and wants to stay on it.  Also with chronic daily headache (tension) related to excess caffeine, poor nutrition, increased stress and poor sleep pattern.   Dx:  Juvenile myoclonic epilepsy, not intractable, without status epilepticus ([place])  Migraine without aura and without status migrainosus, not intractable  Analgesic overuse headache  Excessive daytime sleepiness  Insomnia     PLAN:  JME/SEIZURES - Continue phenobarbital 97.2mg  (2 tabs daily) for seizure control; I would prefer to reduce phenobarbital to 97.2 mg daily and add topiramate to help with seizure and migraine control, but she is reluctant; will address again in future visit  HEADACHES - follow up sleep study for increasing headache evaluation, snoring, fatigue - encouraged better nutrition, sleep hygeine, caffeine reduction and daily exercise - taper off tylenol (use only 5-10 tabs per month as a goal) - continue amitriptyline 25mg  at bedtime - rizatriptan 10mg  as needed for breakthrough headache; may repeat x 1 after 2 hours; max 2 tabs per day or 8 per month  DENTAL HYGEINE - follow up with dentist  STRESS/ANXIETY - continue lexapro   Meds ordered this encounter  Medications  . PHENobarbital (LUMINAL) 97.2 MG tablet    Sig: Take  2 tablets (194.4 mg total) by mouth daily.    Dispense:  60 tablet    Refill:  5  . amitriptyline (ELAVIL) 25 MG tablet    Sig: Take 1 tablet (25 mg total) by mouth at bedtime.    Dispense:  30 tablet    Refill:  6  . rizatriptan (MAXALT-MLT) 10 MG disintegrating tablet    Sig: Take 1 tablet (10 mg total) by mouth as needed for migraine. May repeat in 2 hours if needed    Dispense:  9 tablet    Refill:  11   Return in about 6 months (around 06/24/2016).    [name], MD [id number], [id number] AM Certified in Neurology, Neurophysiology and Neuroimaging  [facility] [address] [place],  [address] [phone]

## 2015-12-27 ENCOUNTER — Telehealth: Payer: Self-pay | Admitting: Neurology

## 2015-12-27 DIAGNOSIS — R635 Abnormal weight gain: Secondary | ICD-10-CM

## 2015-12-27 DIAGNOSIS — G478 Other sleep disorders: Secondary | ICD-10-CM

## 2015-12-27 DIAGNOSIS — G4719 Other hypersomnia: Secondary | ICD-10-CM

## 2015-12-27 DIAGNOSIS — R0683 Snoring: Secondary | ICD-10-CM

## 2015-12-27 DIAGNOSIS — R519 Headache, unspecified: Secondary | ICD-10-CM

## 2015-12-27 DIAGNOSIS — R51 Headache: Secondary | ICD-10-CM

## 2015-12-27 NOTE — Telephone Encounter (Signed)
Patient called to schedule her sleep study however I don't see an order in for this patient but the notes state a sleep study.  Can I get an order?

## 2015-12-27 NOTE — Telephone Encounter (Signed)
Order has been reentered.

## 2015-12-29 ENCOUNTER — Ambulatory Visit
Admission: RE | Admit: 2015-12-29 | Discharge: 2015-12-29 | Disposition: A | Discharge status: Home / Self Care | Payer: BLUE CROSS/BLUE SHIELD | Source: Ambulatory Visit

## 2015-12-29 DIAGNOSIS — Z1231 Encounter for screening mammogram for malignant neoplasm of breast: Secondary | ICD-10-CM

## 2016-01-03 ENCOUNTER — Other Ambulatory Visit: Payer: Self-pay | Admitting: Diagnostic Neuroimaging

## 2016-01-03 ENCOUNTER — Other Ambulatory Visit: Payer: Self-pay | Admitting: *Deleted

## 2016-01-03 MED ORDER — PHENOBARBITAL 97.2 MG PO TABS: 194.4000 mg | ORAL_TABLET | Freq: Every day | ORAL | Status: DC

## 2016-01-03 NOTE — Telephone Encounter (Signed)
Received request for prescription for PBS 97.2tabs # 60   Take 2 tabs by mouth once daily.    Did not receive 12-24-15 request.

## 2016-01-04 NOTE — Telephone Encounter (Signed)
Faxed luminal RX to [facility] on [address]. Received a receipt of confirmation.

## 2016-06-26 ENCOUNTER — Ambulatory Visit (INDEPENDENT_AMBULATORY_CARE_PROVIDER_SITE_OTHER): Payer: BLUE CROSS/BLUE SHIELD | Admitting: Diagnostic Neuroimaging

## 2016-06-26 ENCOUNTER — Encounter: Payer: Self-pay | Admitting: Diagnostic Neuroimaging

## 2016-06-26 VITALS — BP 107/70 | HR 81 | Wt 161.8 lb

## 2016-06-26 DIAGNOSIS — G43009 Migraine without aura, not intractable, without status migrainosus: Secondary | ICD-10-CM | POA: Insufficient documentation

## 2016-06-26 DIAGNOSIS — G40B09 Juvenile myoclonic epilepsy, not intractable, without status epilepticus: Secondary | ICD-10-CM

## 2016-06-26 MED ORDER — RIZATRIPTAN BENZOATE 10 MG PO TBDP
10.0000 mg | ORAL_TABLET | ORAL | 11 refills | Status: DC | PRN
Start: 2016-06-26 — End: 2017-07-03

## 2016-06-26 MED ORDER — PHENOBARBITAL 97.2 MG PO TABS: 194.4000 mg | ORAL_TABLET | Freq: Every day | ORAL | 5 refills | Status: DC

## 2016-06-26 NOTE — Progress Notes (Signed)
[facility]  PATIENT: [name] DOB: 1970-12-09  REFERRING CLINICIAN:  HISTORY FROM: patient and husband  REASON FOR VISIT: follow up   HISTORICAL  CHIEF COMPLAINT:  Chief Complaint  Patient presents with  . Juvenile myoclonic epilepsy    rm 4, husband- [name], "cannot remember the last time I had a seizure"  . Follow-up    6 month    HISTORY OF PRESENT ILLNESS:   UPDATE 06/26/16: Since last visit, doing well. No seizures. Sleep is improved on her own. Tried on amitriptyline, but HA felt worse, so she stopped it. Having 2-3 migraine per month, well controlled with rizatriptan.  UPDATE 12/24/15: Since last visit, no seizures. Still with daily headaches. Also with 1 severe migraine per month. Eating 2 x per day. Soda (1 per day). Still with 6 tylenol tabs per day. Amitriptyline helped some in the beginning, but seems to be less effective now.   UPDATE 10/22/15: Since last visit, no seizures. Has cut down soda (now 1-2 per day), has quit smoking, and now exercising some. Still only eats 1 meal per day. Still taking 5-9 tabs tylenol on daily basis. Has 1 migraine per month (pounding global HA, sens to light / sound). Also with daily lower level headaches. Still poor sleep quality.   UPDATE 10/21/14: Since last visit, no seizures. C/o daily headaches, and taking daily Tylenol. She has interrupted sleep at nighttime. She continues to drink 5-6 Dr. [name] cans per day. She eats once per day. She has increased stress related to work. Fortunately patient has been able to quit smoking cigarettes since 08/28/2014.  UPDATE 10/21/13: Since last visit, no new events or seizures. Now has [facility], but no PCP. Tolerating PB 97.2mg  tabs (2 tabs qAM).   UPDATE 05/29/12: No seizure. No PCP. No insurance.   UPDATE 12/22/10: No seizure since age 31's.  Tolerating PB, and wants to stay on it.  Having 1 migraine every 1-2 years (global, severe, pressure, photophobia,  phonophobia).  Usually midrin helps.  Never tried triptan.  Also using tylenol for tension HA.  Last migraine Aug 2011.  PRIOR HPI (12/15/08, Dr. Gaynell Face): "Lananh is a 45 year old married woman with a long-standing history of juvenile myoclonic epilepsy.  She has not had seizures for years.  The only manifestation of her symptoms has been generalized tonic-clonic seizures.  She has taken and tolerated Phenobarbital without significant side effects. Patient has daily headaches that I think are more tension type in nature than migraine.  Become on later in the day except when she has stress. Since her last visit, she had hysterectomy and oophorectomy for removal of an ovarian cyst.  She was noted to have scarred fallopian tubes at the time of operation."   REVIEW OF SYSTEMS: Full 14 system review of systems performed and notable only as per HPI.  ALLERGIES: Allergies  Allergen Reactions  . Asa Buff (Mag [Buffered Aspirin]   . Ibuprofen     GI upset  . Penicillins     childhood    HOME MEDICATIONS: Outpatient Medications Prior to Visit  Medication Sig Dispense Refill  . escitalopram (LEXAPRO) 20 MG tablet Take 20 mg by mouth daily.    Marland Kitchen PHENobarbital (LUMINAL) 97.2 MG tablet Take 2 tablets (194.4 mg total) by mouth daily. 60 tablet 5  . rizatriptan (MAXALT-MLT) 10 MG disintegrating tablet Take 1 tablet (10 mg total) by mouth as needed for migraine. May repeat in 2 hours if needed 9 tablet 11  .  amitriptyline (ELAVIL) 25 MG tablet Take 1 tablet (25 mg total) by mouth at bedtime. 30 tablet 6   No facility-administered medications prior to visit.     PAST MEDICAL HISTORY: Past Medical History:  Diagnosis Date  . Migraine   . Seizure ([place])     PAST SURGICAL HISTORY: Past Surgical History:  Procedure Laterality Date  . ABDOMINAL HYSTERECTOMY  2012    FAMILY HISTORY: Family History  Problem Relation Age of Onset  . Hypertension Mother     SOCIAL HISTORY:  Social History    Social History  . Marital status: Married    Spouse name: [name]  . Number of children: 0  . Years of education: 10th   Occupational History  .  [employer]   Social History Main Topics  . Smoking status: Former Smoker    Packs/day: 1.00    Types: Cigarettes  . Smokeless tobacco: Never Used     Comment: Quit Janurary 1st  . Alcohol use No  . Drug use: No  . Sexual activity: Not on file   Other Topics Concern  . Not on file   Social History Narrative   Patient lives at home with spouse.   Caffeine Use: 4-5 cups daily     PHYSICAL EXAM  Vitals:   06/26/16 1008  BP: 107/70  Pulse: 81  Weight: 161 lb 12.8 oz (73.4 kg)    Not recorded      Body mass index is 25.34 kg/m.  GENERAL EXAM: Patient is in no distress; well developed, nourished and groomed; neck is supple; POOR DENTITION.  CARDIOVASCULAR: Regular rate and rhythm, no murmurs, no carotid bruits  NEUROLOGIC: MENTAL STATUS: awake, alert, language fluent, comprehension intact, naming intact, fund of knowledge appropriate CRANIAL NERVE: pupils equal and reactive to light, visual fields full to confrontation, extraocular muscles intact, no nystagmus, facial sensation and strength symmetric, hearing intact, palate elevates symmetrically, uvula midline, shoulder shrug symmetric, tongue midline. MOTOR: normal bulk and tone, full strength in the BUE, BLE SENSORY: normal and symmetric to light touch COORDINATION: finger-nose-finger, fine finger movements normal REFLEXES: deep tendon reflexes present and symmetric GAIT/STATION: narrow based gait; romberg is negative    DIAGNOSTIC DATA (LABS, IMAGING, TESTING) - I reviewed patient records, labs, notes, testing and imaging myself where available.  Lab Results  Component Value Date   WBC 5.5 10/22/2015   HGB 13.1 06/15/2011   HCT 40.9 10/22/2015   MCV 95 10/22/2015   PLT 221 10/22/2015      Component Value Date/Time   NA 140 10/22/2015 1206   K 4.9  10/22/2015 1206   CL 103 10/22/2015 1206   CO2 26 10/22/2015 1206   GLUCOSE 80 10/22/2015 1206   GLUCOSE 142 (H) 06/15/2011 1800   BUN 9 10/22/2015 1206   CREATININE 0.59 10/22/2015 1206   CALCIUM 9.0 10/22/2015 1206   PROT 6.5 10/22/2015 1206   ALBUMIN 4.1 10/22/2015 1206   AST 13 10/22/2015 1206   ALT 14 10/22/2015 1206   ALKPHOS 59 10/22/2015 1206   BILITOT 0.2 10/22/2015 1206   GFRNONAA 110 10/22/2015 1206   GFRAA 127 10/22/2015 1206   No results found for: CHOL Lab Results  Component Value Date   HGBA1C 5.4 10/22/2015   No results found for: VITAMINB12 Lab Results  Component Value Date   TSH 2.070 10/22/2015    11/18/15 MRI brain [I reviewed images myself and agree with interpretation. -VRP]  - normal     ASSESSMENT AND PLAN  45  y.o. year old female here with seizure d/o (JME) since childhood, but no seizure since 45 years old.  Tolerating PB and wants to stay on it.  Also with chronic daily headache (tension) related to excess caffeine, poor nutrition, increased stress and poor sleep pattern.   Dx:  Juvenile myoclonic epilepsy, not intractable, without status epilepticus ([place])  Migraine without aura and without status migrainosus, not intractable    PLAN:  JME/SEIZURES - Continue phenobarbital 97.2mg  (2 tabs daily) for seizure control; I would prefer to reduce phenobarbital to 97.2 mg daily and add topiramate to help with seizure and migraine control, but she is reluctant; will address again in future visit  HEADACHES - consider follow up sleep study for increasing headache evaluation, snoring, fatigue; for now, symptoms are improving spontenously - encouraged better nutrition, sleep hygeine, caffeine reduction and daily exercise - taper off tylenol (use only 5-10 tabs per month as a goal) - rizatriptan 10mg  as needed for breakthrough headache; may repeat x 1 after 2 hours; max 2 tabs per day or 8 per month  DENTAL HYGEINE - follow up with  dentist  STRESS/ANXIETY - continue lexapro  Meds ordered this encounter  Medications  . PHENobarbital (LUMINAL) 97.2 MG tablet    Sig: Take 2 tablets (194.4 mg total) by mouth daily.    Dispense:  60 tablet    Refill:  5  . rizatriptan (MAXALT-MLT) 10 MG disintegrating tablet    Sig: Take 1 tablet (10 mg total) by mouth as needed for migraine. May repeat in 2 hours if needed    Dispense:  9 tablet    Refill:  11   Return in about 1 year (around 06/26/2017).    [name], MD [id number], [id number] AM Certified in Neurology, Neurophysiology and Neuroimaging  [facility] [address], [place], [place] [address] [phone]

## 2016-06-26 NOTE — Patient Instructions (Signed)
Thank you for coming to see [place] at [facility]. I hope we have been able to provide you high quality care today.  You may receive a patient satisfaction survey over the next few weeks. We would appreciate your feedback and comments so that we may continue to improve ourselves and the health of our patients.   JME/SEIZURES - Continue phenobarbital 97.64m (2 tabs daily) for seizure control; I would prefer to reduce phenobarbital to 97.2 mg daily and add topiramate to help with seizure and migraine control, but she is reluctant; will address again in future visit  HEADACHES - consider follow up sleep study for increasing headache evaluation, snoring, fatigue; for now, symptoms are improving spontenously - encouraged better nutrition, sleep hygeine, caffeine reduction and daily exercise - taper off tylenol (use only 5-10 tabs per month as a goal) - rizatriptan 113mas needed for breakthrough headache; may repeat x 1 after 2 hours; max 2 tabs per day or 8 per month  DENTAL HYGEINE - follow up with dentist  STRESS/ANXIETY - continue lexapro   ~~~~~~~~~~~~~~~~~~~~~~~~~~~~~~~~~~~~~~~~~~~~~~~~~~~~~~~~~~~~~~~~~  DR. Lailany Enoch'S GUIDE TO HAPPY AND HEALTHY LIVING These are some of my general health and wellness recommendations. Some of them may apply to you better than others. Please use common sense as you try these suggestions and feel free to ask me any questions.   ACTIVITY/FITNESS Mental, social, emotional and physical stimulation are very important for brain and body health. Try learning a new activity (arts, music, language, sports, games).  Keep moving your body to the best of your abilities. You can do this at home, inside or outside, the park, community center, gym or anywhere you like. Consider a physical therapist or personal trainer to get started. Consider the app Sworkit. Fitness trackers such as smart-watches, smart-phones or Fitbits can help as  well.   NUTRITION Eat more plants: colorful vegetables, nuts, seeds and berries.  Eat less sugar, salt, preservatives and processed foods.  Avoid toxins such as cigarettes and alcohol.  Drink water when you are thirsty. Warm water with a slice of lemon is an excellent morning drink to start the day.  Consider these websites for more information The Nutrition Source (hthttps://www.henry-hernandez.biz/Precision Nutrition (wwWindowBlog.ch  RELAXATION Consider practicing mindfulness meditation or other relaxation techniques such as deep breathing, prayer, yoga, tai chi, massage. See website mindful.org or the apps Headspace or Calm to help get started.   SLEEP Try to get at least 7-8+ hours sleep per day. Regular exercise and reduced caffeine will help you sleep better. Practice good sleep hygeine techniques. See website sleep.org for more information.   PLANNING Prepare estate planning, living will, healthcare POA documents. Sometimes this is best planned with the help of an attorney. Theconversationproject.org and agingwithdignity.org are excellent resources.

## 2016-12-26 ENCOUNTER — Other Ambulatory Visit: Payer: Self-pay | Admitting: Diagnostic Neuroimaging

## 2017-02-08 ENCOUNTER — Other Ambulatory Visit: Payer: Self-pay | Admitting: Family Medicine

## 2017-02-08 DIAGNOSIS — Z1231 Encounter for screening mammogram for malignant neoplasm of breast: Secondary | ICD-10-CM

## 2017-02-19 ENCOUNTER — Ambulatory Visit
Admission: RE | Admit: 2017-02-19 | Discharge: 2017-02-19 | Disposition: A | Discharge status: Home / Self Care | Payer: BLUE CROSS/BLUE SHIELD | Source: Ambulatory Visit | Attending: Family Medicine | Admitting: Family Medicine

## 2017-02-19 DIAGNOSIS — Z1231 Encounter for screening mammogram for malignant neoplasm of breast: Secondary | ICD-10-CM

## 2017-04-09 ENCOUNTER — Encounter: Payer: Self-pay | Admitting: Diagnostic Neuroimaging

## 2017-04-12 ENCOUNTER — Ambulatory Visit: Payer: BLUE CROSS/BLUE SHIELD | Admitting: Primary Care

## 2017-04-20 ENCOUNTER — Ambulatory Visit (INDEPENDENT_AMBULATORY_CARE_PROVIDER_SITE_OTHER): Payer: BLUE CROSS/BLUE SHIELD | Admitting: Primary Care

## 2017-04-20 ENCOUNTER — Encounter: Payer: Self-pay | Admitting: Primary Care

## 2017-04-20 VITALS — BP 122/76 | HR 67 | Temp 98.5°F | Ht 67.0 in | Wt 175.4 lb

## 2017-04-20 DIAGNOSIS — G43009 Migraine without aura, not intractable, without status migrainosus: Secondary | ICD-10-CM

## 2017-04-20 DIAGNOSIS — F411 Generalized anxiety disorder: Secondary | ICD-10-CM | POA: Insufficient documentation

## 2017-04-20 DIAGNOSIS — R21 Rash and other nonspecific skin eruption: Secondary | ICD-10-CM | POA: Insufficient documentation

## 2017-04-20 DIAGNOSIS — R5383 Other fatigue: Secondary | ICD-10-CM

## 2017-04-20 DIAGNOSIS — R569 Unspecified convulsions: Secondary | ICD-10-CM

## 2017-04-20 DIAGNOSIS — G40B09 Juvenile myoclonic epilepsy, not intractable, without status epilepticus: Secondary | ICD-10-CM

## 2017-04-20 LAB — BASIC METABOLIC PANEL
BUN: 14 mg/dL (ref 6–23)
CO2: 28 mEq/L (ref 19–32)
Calcium: 9.1 mg/dL (ref 8.4–10.5)
Chloride: 105 mEq/L (ref 96–112)
Creatinine, Ser: 0.78 mg/dL (ref 0.40–1.20)
GFR: 83.84 mL/min (ref >60.00)
Glucose, Bld: 85 mg/dL (ref 70–99)
Potassium: 3.6 mEq/L (ref 3.5–5.1)
Sodium: 139 mEq/L (ref 135–145)

## 2017-04-20 LAB — CBC
HCT: 39.8 % (ref 36.0–46.0)
Hemoglobin: 13.3 g/dL (ref 12.0–15.0)
MCHC: 33.5 g/dL (ref 30.0–36.0)
MCV: 96.2 fl (ref 78.0–100.0)
Platelets: 211 10*3/uL (ref 150.0–400.0)
RBC: 4.13 Mil/uL (ref 3.87–5.11)
RDW: 12.9 % (ref 11.5–15.5)
WBC: 7.3 10*3/uL (ref 4.0–10.5)

## 2017-04-20 LAB — VITAMIN D 25 HYDROXY (VIT D DEFICIENCY, FRACTURES): VITD: 20.34 ng/mL — ABNORMAL LOW (ref 30.00–100.00)

## 2017-04-20 LAB — TSH: TSH: 1.97 u[IU]/mL (ref 0.35–4.50)

## 2017-04-20 LAB — VITAMIN B12: Vitamin B-12: 155 pg/mL — ABNORMAL LOW (ref 211–911)

## 2017-04-20 MED ORDER — PREDNISONE 10 MG PO TABS: ORAL_TABLET | ORAL | 0 refills | Status: DC

## 2017-04-20 NOTE — Progress Notes (Signed)
Subjective:    Patient ID: [name], female    DOB: 06/23/1971, 46 y.o.   MRN: [id number]  HPI  [name] is a 46 year old female who presents today to establish care and discuss the problems mentioned below. Will obtain old records. Her last physical was years ago.  1) Seizure Disorder: Diagnosed at age 59. Currently managed on phenobarbital 97.2 mg and following with [facility]. Last visit was in October 2017. Her last seizure was at 19. Her next appointment is in November 2018.  2) Migraines: Currently managed on Maxalt 10 mg. Migraines typically occur once monthly but over the past two months she's noticed migraines 2-3 times weekly.   3) Rash: Located to the left upper extremity that has been present for the past 1 year. She's applied Cortisone, lotion without improvement. Her rash is itchy in nature, worse at night. She denies changes in medications, body washes. She has changed her laundry detergents several times without improvement.   4) Generalized Anxiety Disorder: Diagnosed three years ago. Currently managed on Lexapro 10 mg for which she's taken for the past three years for anger and irritability. This happened since she stopped smoking cigarettes.   5) Weight Gain: Weight gain over the past three years. She does not exercise.   Diet currently consists of:  Breakfast: Skips Lunch: Frozen meal Dinner: Restaurants (consistently for three years) Steak, baked potatoes, chicken, pork chops, sandwiches, peas, corn Snacks: Crackers and cheese, fruit Desserts: Occasionally  Beverages: Water, Dr. [name] can, sweet tea  Exercise: She does not currently exercise due to feeling fatigued everyday after work.  6) Ankle Edema: Located to bilateral ankles that she first noticed intermittently 2 years ago. Edema improves in the morning, gradually increases throughout the day. She endorses a poor diet, does not exercise, works on her feet during most of the day,  does not elevate her feet at night.  7) Side Pain: Located to bilateral lateral sides for the past several nights. Cramping and spasms are present only at night, none during the day. Denies injury, increased activity, flank pain, urinary symptoms.   Review of Systems  Constitutional: Positive for fatigue.  HENT: Negative for congestion.   Eyes: Negative for visual disturbance.  Respiratory: Negative for shortness of breath.   Cardiovascular: Negative for chest pain.  Genitourinary: Negative for flank pain.  Musculoskeletal: Positive for myalgias.  Skin: Positive for rash.  Allergic/Immunologic: Negative for environmental allergies.  Neurological: Positive for headaches. Negative for seizures.       Past Medical History:  Diagnosis Date  . Migraine   . Seizure [facility])      Social History   Social History  . Marital status: Married    Spouse name: [name]  . Number of children: 0  . Years of education: 10th   Occupational History  .  [employer]   Social History Main Topics  . Smoking status: Former Smoker    Packs/day: 1.00    Types: Cigarettes  . Smokeless tobacco: Never Used     Comment: Quit Janurary 1st  . Alcohol use No  . Drug use: No  . Sexual activity: Not on file   Other Topics Concern  . Not on file   Social History Narrative   Patient lives at home with spouse.   No children.   Works at [employer].   Enjoys going to [employer], going to estate sells.    Past Surgical History:  Procedure Laterality Date  .  ABDOMINAL HYSTERECTOMY  2012    Family History  Problem Relation Age of Onset  . Hypertension Mother     Allergies  Allergen Reactions  . Asa Buff (Mag [Buffered Aspirin]   . Ibuprofen     GI upset  . Penicillins     childhood    Current Outpatient Prescriptions on File Prior to Visit  Medication Sig Dispense Refill  . escitalopram (LEXAPRO) 20 MG tablet Take 20 mg by mouth daily.    Marland Kitchen PHENobarbital (LUMINAL) 97.2 MG tablet TAKE TWO TABLETS  BY MOUTH ONCE DAILY 60 tablet 5  . rizatriptan (MAXALT-MLT) 10 MG disintegrating tablet Take 1 tablet (10 mg total) by mouth as needed for migraine. May repeat in 2 hours if needed 9 tablet 11   No current facility-administered medications on file prior to visit.     BP 122/76   Pulse 67   Temp 98.5 F (36.9 C) (Oral)   Ht 5\' 7"  (1.702 m)   Wt 175 lb 6.4 oz (79.6 kg)   SpO2 95%   BMI 27.47 kg/m    Objective:   Physical Exam  Constitutional: She is oriented to person, place, and time. She appears well-nourished.  Neck: Neck supple.  Cardiovascular: Normal rate and regular rhythm.   Pulmonary/Chest: Effort normal and breath sounds normal.  Musculoskeletal:       Lumbar back: She exhibits normal range of motion, no tenderness and no pain.       Arms: Tenderness to bilateral sides of thoracic and lumbar spine  Neurological: She is alert and oriented to person, place, and time.  Skin: Skin is warm and dry.  Psychiatric: She has a normal mood and affect.          Assessment & Plan:  Fatigue:  Present for years, mostly after work. Will check labs to ensure no metabolic cause. Recommended regular exercise, healthy diet.  Myalgias:  Located to bilateral sides. No urinary symptoms, do not suspect renal involvement. Suspect MSK and will have her start stretching and applying heat for spasms. Trial Ibuprofen. Check BMP.  Sheral Flow, NP

## 2017-04-20 NOTE — Assessment & Plan Note (Signed)
Following with neurology, continue phenobarbital. No seizure since age 46.

## 2017-04-20 NOTE — Patient Instructions (Addendum)
Complete lab work prior to leaving today. I will notify you of your results once received.   Start exercising. You should be getting 150 minutes of moderate intensity exercise weekly.  It's important to improve your diet by reducing consumption of restaurant food, fried food, processed snack foods, sugary drinks. Increase consumption of fresh vegetables and fruits, whole grains, water.  Ensure you are drinking 64 ounces of water daily.  Elevate your feet at night after you get home. Consider wearing compression socks during the day.  Start prednisone tablets for rash. Take three tablets for 2 days, then two tablets for 2 days, then one tablet for 2 days.  Please schedule a physical with me in 6 months. You may also schedule a lab only appointment 3-4 days prior. We will discuss your lab results in detail during your physical.  It was a pleasure to meet you today! Please don't hesitate to call me with any questions. Welcome to [facility]!

## 2017-04-20 NOTE — Assessment & Plan Note (Signed)
Located to left upper extremity, anterior and posterior x 1 year. Unsure of etiology, no wheals or papular appearance. Given duration of rash, will treat with low dose prednisone tablet. She will notify if no update in 1 week. Consider dermatology referral if no resolve.

## 2017-04-20 NOTE — Assessment & Plan Note (Signed)
Increased frequency of migraines, following with Neurology and plans on discussing at her next appointment.

## 2017-04-20 NOTE — Assessment & Plan Note (Signed)
Diagnosed three years ago after she stopped smoking. Continue Lexapro. Recommended she start exercising and eating a healthy diet for weight gain. Do not recommend we switch her from Lexapro at this point. Denies SI/HI.

## 2017-04-20 NOTE — Assessment & Plan Note (Signed)
No seizure since age 46. Compliant to phenobarbital, continue same. Following with neurology.

## 2017-04-22 ENCOUNTER — Other Ambulatory Visit: Payer: Self-pay | Admitting: Primary Care

## 2017-04-22 DIAGNOSIS — E538 Deficiency of other specified B group vitamins: Secondary | ICD-10-CM

## 2017-04-27 ENCOUNTER — Encounter: Payer: Self-pay | Admitting: *Deleted

## 2017-05-10 ENCOUNTER — Ambulatory Visit: Payer: BLUE CROSS/BLUE SHIELD | Admitting: Family Medicine

## 2017-06-15 ENCOUNTER — Other Ambulatory Visit: Payer: Self-pay | Admitting: Diagnostic Neuroimaging

## 2017-06-15 NOTE — Telephone Encounter (Signed)
Phenobarbotal refill Rx successfully faxed to [facility].

## 2017-06-18 NOTE — Telephone Encounter (Signed)
Phenobarbital refill Rx faxed again to [facility], [facility].

## 2017-06-21 ENCOUNTER — Other Ambulatory Visit (INDEPENDENT_AMBULATORY_CARE_PROVIDER_SITE_OTHER): Payer: BLUE CROSS/BLUE SHIELD

## 2017-06-21 DIAGNOSIS — E538 Deficiency of other specified B group vitamins: Secondary | ICD-10-CM | POA: Diagnosis not present

## 2017-06-21 DIAGNOSIS — D510 Vitamin B12 deficiency anemia due to intrinsic factor deficiency: Secondary | ICD-10-CM | POA: Diagnosis not present

## 2017-06-22 LAB — VITAMIN B12: Vitamin B-12: 331 pg/mL (ref 211–911)

## 2017-06-26 ENCOUNTER — Ambulatory Visit: Payer: BLUE CROSS/BLUE SHIELD | Admitting: Diagnostic Neuroimaging

## 2017-07-03 ENCOUNTER — Ambulatory Visit (INDEPENDENT_AMBULATORY_CARE_PROVIDER_SITE_OTHER): Payer: BLUE CROSS/BLUE SHIELD | Admitting: Diagnostic Neuroimaging

## 2017-07-03 ENCOUNTER — Encounter: Payer: Self-pay | Admitting: Diagnostic Neuroimaging

## 2017-07-03 VITALS — BP 119/79 | HR 79 | Ht 65.0 in | Wt 170.6 lb

## 2017-07-03 DIAGNOSIS — T3995XA Adverse effect of unspecified nonopioid analgesic, antipyretic and antirheumatic, initial encounter: Secondary | ICD-10-CM

## 2017-07-03 DIAGNOSIS — R0683 Snoring: Secondary | ICD-10-CM

## 2017-07-03 DIAGNOSIS — G43009 Migraine without aura, not intractable, without status migrainosus: Secondary | ICD-10-CM | POA: Diagnosis not present

## 2017-07-03 DIAGNOSIS — G4700 Insomnia, unspecified: Secondary | ICD-10-CM | POA: Diagnosis not present

## 2017-07-03 DIAGNOSIS — G4440 Drug-induced headache, not elsewhere classified, not intractable: Secondary | ICD-10-CM | POA: Diagnosis not present

## 2017-07-03 DIAGNOSIS — G4719 Other hypersomnia: Secondary | ICD-10-CM | POA: Diagnosis not present

## 2017-07-03 DIAGNOSIS — G40B09 Juvenile myoclonic epilepsy, not intractable, without status epilepticus: Secondary | ICD-10-CM | POA: Diagnosis not present

## 2017-07-03 MED ORDER — PHENOBARBITAL 97.2 MG PO TABS: 97.2000 mg | ORAL_TABLET | Freq: Every day | ORAL | 5 refills | Status: DC

## 2017-07-03 MED ORDER — RIZATRIPTAN BENZOATE 10 MG PO TBDP: 10.0000 mg | ORAL_TABLET | ORAL | 11 refills | Status: DC | PRN

## 2017-07-03 MED ORDER — TOPIRAMATE 50 MG PO TABS: 50.0000 mg | ORAL_TABLET | Freq: Two times a day (BID) | ORAL | 12 refills | Status: DC

## 2017-07-03 NOTE — Progress Notes (Signed)
[facility]  PATIENT: [name] DOB: 02/08/1971  REFERRING CLINICIAN:  HISTORY FROM: patient REASON FOR VISIT: follow up   HISTORICAL  CHIEF COMPLAINT:  Chief Complaint  Patient presents with  . Follow-up  . Seizures    none  . Migraine    3 wks of headaches/ migraines, one today.  (level 4 today).      HISTORY OF PRESENT ILLNESS:   UPDATE (09/02/16, [facility]P): Since last visit, doing well. No seizures. Tolerating meds. No alleviating or aggravating factors. In last 3 weeks has had more HA / migraines (daily). Rizatriptan and tylenol not helping that much. Had cut down tylenol for a while, but now back up to 6 tabs per day. Drinking 1 soda per day. Eating 3 times per day. Sleep is a little better.   UPDATE (09/02/16, VRP): Since last visit, doing well --> no seizures. Tolerating meds. No alleviating or aggravating factors. Has had 3 weeks of consistent headaches. Taking up to tylenol x 6 tabs per day.   UPDATE 06/26/16: Since last visit, doing well. No seizures. Sleep is improved on her own. Tried on amitriptyline, but HA felt worse, so she stopped it. Having 2-3 migraine per month, well controlled with rizatriptan.  UPDATE 12/24/15: Since last visit, no seizures. Still with daily headaches. Also with 1 severe migraine per month. Eating 2 x per day. Soda (1 per day). Still with 6 tylenol tabs per day. Amitriptyline helped some in the beginning, but seems to be less effective now.   UPDATE 10/22/15: Since last visit, no seizures. Has cut down soda (now 1-2 per day), has quit smoking, and now exercising some. Still only eats 1 meal per day. Still taking 5-9 tabs tylenol on daily basis. Has 1 migraine per month (pounding global HA, sens to light / sound). Also with daily lower level headaches. Still poor sleep quality.   UPDATE 10/21/14: Since last visit, no seizures. C/o daily headaches, and taking daily Tylenol. She has interrupted sleep at nighttime. She continues to  drink 5-6 Dr. [name] cans per day. She eats once per day. She has increased stress related to work. Fortunately patient has been able to quit smoking cigarettes since 08/28/2014.  UPDATE 10/21/13: Since last visit, no new events or seizures. Now has [facility], but no PCP. Tolerating PB 97.2mg  tabs (2 tabs qAM).   UPDATE 05/29/12: No seizure. No PCP. No insurance.   UPDATE 12/22/10: No seizure since age 29's.  Tolerating PB, and wants to stay on it.  Having 1 migraine every 1-2 years (global, severe, pressure, photophobia, phonophobia).  Usually midrin helps.  Never tried triptan.  Also using tylenol for tension HA.  Last migraine Aug 2011.  PRIOR HPI (12/15/08, Dr. Gaynell Face): "Trini is a 46 year old married woman with a long-standing history of juvenile myoclonic epilepsy.  She has not had seizures for years.  The only manifestation of her symptoms has been generalized tonic-clonic seizures.  She has taken and tolerated Phenobarbital without significant side effects. Patient has daily headaches that I think are more tension type in nature than migraine.  Become on later in the day except when she has stress. Since her last visit, she had hysterectomy and oophorectomy for removal of an ovarian cyst.  She was noted to have scarred fallopian tubes at the time of operation."   REVIEW OF SYSTEMS: Full 14 system review of systems performed and negative except headache.    ALLERGIES: Allergies  Allergen Reactions  . Fleet Contras [phone] [  Buffered Aspirin]   . Ibuprofen     GI upset  . Penicillins     childhood    HOME MEDICATIONS: Outpatient Medications Prior to Visit  Medication Sig Dispense Refill  . escitalopram (LEXAPRO) 20 MG tablet Take 20 mg by mouth daily.    Marland Kitchen PHENobarbital (LUMINAL) 97.2 MG tablet TAKE 2 TABLETS BY MOUTH ONCE DAILY 60 tablet 5  . rizatriptan (MAXALT-MLT) 10 MG disintegrating tablet Take 1 tablet (10 mg total) by mouth as needed for migraine. May repeat in 2 hours if needed 9  tablet 11  . predniSONE (DELTASONE) 10 MG tablet Start prednisone tablets. Take three tablets for 2 days, then two tablets for 2 days, then one tablet for 2 days. 12 tablet 0   No facility-administered medications prior to visit.     PAST MEDICAL HISTORY: Past Medical History:  Diagnosis Date  . Migraine   . Seizure ([place])     PAST SURGICAL HISTORY: Past Surgical History:  Procedure Laterality Date  . ABDOMINAL HYSTERECTOMY  2012    FAMILY HISTORY: Family History  Problem Relation Age of Onset  . Hypertension Mother     SOCIAL HISTORY:  Social History   Socioeconomic History  . Marital status: Married    Spouse name: [name]  . Number of children: 0  . Years of education: 10th  . Highest education level: Not on file  Social Needs  . Financial resource strain: Not on file  . Food insecurity - worry: Not on file  . Food insecurity - inability: Not on file  . Transportation needs - medical: Not on file  . Transportation needs - non-medical: Not on file  Occupational History    Employer: [id number]  Tobacco Use  . Smoking status: Former Smoker    Packs/day: 1.00    Types: Cigarettes  . Smokeless tobacco: Never Used  . Tobacco comment: Quit Janurary 1st  Substance and Sexual Activity  . Alcohol use: No    Alcohol/week: 0.0 oz  . Drug use: No  . Sexual activity: Not on file  Other Topics Concern  . Not on file  Social History Narrative   Patient lives at home with spouse.   No children.   Works at [employer].   Enjoys going to [employer], going to estate sells.     PHYSICAL EXAM  Vitals:   07/03/17 1244  BP: 119/79  Pulse: 79  Weight: 170 lb 9.6 oz (77.4 kg)  Height: 5\' 5"  (1.651 m)    Not recorded      Body mass index is 28.39 kg/m.  GENERAL EXAM: Patient is in no distress; well developed, nourished and groomed; neck is supple; POOR DENTITION.  CARDIOVASCULAR: Regular rate and rhythm, no murmurs, no carotid bruits  NEUROLOGIC: MENTAL STATUS:  awake, alert, language fluent, comprehension intact, naming intact, fund of knowledge appropriate CRANIAL NERVE: pupils equal and reactive to light, visual fields full to confrontation, extraocular muscles intact, no nystagmus, facial sensation and strength symmetric, hearing intact, palate elevates symmetrically, uvula midline, shoulder shrug symmetric, tongue midline. MOTOR: normal bulk and tone, full strength in the BUE, BLE SENSORY: normal and symmetric to light touch COORDINATION: finger-nose-finger, fine finger movements normal REFLEXES: deep tendon reflexes present and symmetric GAIT/STATION: narrow based gait; romberg is negative    DIAGNOSTIC DATA (LABS, IMAGING, TESTING) - I reviewed patient records, labs, notes, testing and imaging myself where available.  Lab Results  Component Value Date   WBC 7.3 04/20/2017   HGB 13.3 04/20/2017  HCT 39.8 04/20/2017   MCV 96.2 04/20/2017   PLT 211.0 04/20/2017      Component Value Date/Time   NA 139 04/20/2017 1457   NA 140 10/22/2015 1206   K 3.6 04/20/2017 1457   CL 105 04/20/2017 1457   CO2 28 04/20/2017 1457   GLUCOSE 85 04/20/2017 1457   BUN 14 04/20/2017 1457   BUN 9 10/22/2015 1206   CREATININE 0.78 04/20/2017 1457   CALCIUM 9.1 04/20/2017 1457   PROT 6.5 10/22/2015 1206   ALBUMIN 4.1 10/22/2015 1206   AST 13 10/22/2015 1206   ALT 14 10/22/2015 1206   ALKPHOS 59 10/22/2015 1206   BILITOT 0.2 10/22/2015 1206   GFRNONAA 110 10/22/2015 1206   GFRAA 127 10/22/2015 1206   No results found for: CHOL Lab Results  Component Value Date   HGBA1C 5.4 10/22/2015   Lab Results  Component Value Date   VITAMINB12 331 06/21/2017   Lab Results  Component Value Date   TSH 1.97 04/20/2017    11/18/15 MRI brain [I reviewed images myself and agree with interpretation. -VRP]  - normal     ASSESSMENT AND PLAN  46 y.o. year old female here with seizure d/o (JME) since childhood, but no seizure since 55.  Tolerating PB and  wants to stay on it.  Also with chronic daily headache (tension) related to excess caffeine, poor nutrition, increased stress and poor sleep pattern.    Dx:  Migraine without aura and without status migrainosus, not intractable  Juvenile myoclonic epilepsy, not intractable, without status epilepticus (HCC)  Analgesic overuse headache  Excessive daytime sleepiness  Insomnia, unspecified type  Snoring     PLAN:  HEADACHES / MIGRAINES  - start topiramate 50mg  at bedtime; after 1-2 weeks increase to twice a day  - taper off tylenol (use only 5-10 tabs per month as a goal) - rizatriptan 10mg  as needed for breakthrough headache; may repeat x 1 after 2 hours; max 2 tabs per day or 8 per month - may return for migraine infusion if needed  - continue to optimize nutrition, sleep hygeine, caffeine reduction and daily exercise - check sleep study for increasing headache evaluation, snoring, fatigue  JME/SEIZURES - reduce phenobarbital to 97.2mg  x 1.5 tabs daily; after 1-2 months, reduce to 1 tab daily  DENTAL HYGEINE - follow up with dentist  STRESS/ANXIETY - continue lexapro  Orders Placed This Encounter  Procedures  . Ambulatory referral to Sleep Studies   Meds ordered this encounter  Medications  . rizatriptan (MAXALT-MLT) 10 MG disintegrating tablet    Sig: Take 1 tablet (10 mg total) as needed by mouth for migraine. May repeat in 2 hours if needed    Dispense:  9 tablet    Refill:  11  . topiramate (TOPAMAX) 50 MG tablet    Sig: Take 1 tablet (50 mg total) 2 (two) times daily by mouth.    Dispense:  60 tablet    Refill:  12  . PHENobarbital (LUMINAL) 97.2 MG tablet    Sig: Take 1 tablet (97.2 mg total) daily by mouth.    Dispense:  30 tablet    Refill:  5   Return in about 6 months (around 12/31/2017).    [name], MD 73/11/1935, 9:02 PM Certified in Neurology, Neurophysiology and Neuroimaging  [facility] [address], [place], [place] [address] [phone]

## 2017-07-03 NOTE — Patient Instructions (Addendum)
HEADACHES / MIGRAINES  - start topiramate 50mg  at bedtime; after 1-2 weeks increase to twice a day; drink plenty of water - taper off tylenol (use only 5-10 tabs per month as a goal) - rizatriptan 10mg  as needed for breakthrough headache; may repeat x 1 after 2 hours; max 2 tabs per day or 8 per month - may return for migraine infusion if needed  - continue to optimize nutrition, sleep hygeine, caffeine reduction and daily exercise - check sleep study for increasing headache evaluation, snoring, fatigue  JME/SEIZURES - reduce phenobarbital to 97.2mg  x 1.5 tabs daily; after 1-2 months, reduce to 1 tab daily  DENTAL HYGEINE - follow up with dentist  STRESS/ANXIETY - continue lexapro

## 2017-07-16 ENCOUNTER — Telehealth: Payer: Self-pay | Admitting: Diagnostic Neuroimaging

## 2017-07-16 MED ORDER — SUMATRIPTAN SUCCINATE 6 MG/0.5ML ~~LOC~~ SOLN: 6.0000 mg | SUBCUTANEOUS | 11 refills | Status: DC | PRN

## 2017-07-16 MED ORDER — ONDANSETRON 4 MG PO TBDP: 4.0000 mg | ORAL_TABLET | Freq: Three times a day (TID) | ORAL | 6 refills | Status: DC | PRN

## 2017-07-16 MED ORDER — TIZANIDINE HCL 4 MG PO TABS: 4.0000 mg | ORAL_TABLET | Freq: Four times a day (QID) | ORAL | 3 refills | Status: DC | PRN

## 2017-07-16 NOTE — Telephone Encounter (Signed)
I Called patient,  She could not come in for treatment because she has no drive, her husband is still at work,  I have called in Imitrex injection, along with Zofran as needed, tizanidine 4 mg as needed, she may also mixed together with Aleve 1-2 tablets, and the Benadryl, and go to sleep.

## 2017-07-16 NOTE — Addendum Note (Signed)
Addended by: Marcial Pacas on: 07/16/2017 05:20 PM   Modules accepted: Orders

## 2017-07-16 NOTE — Telephone Encounter (Signed)
Patient states topiramate (TOPAMAX) 50 MG tablet is making her head hurt worse. I advised Dr. Leta Baptist is out of the office and she requests to speak to his nurse. The patient uses Product[facility]process development scientist on [address].

## 2017-07-16 NOTE — Telephone Encounter (Signed)
I called pt.  She had stopped taking the topamax last week, one tablet po qhs, since it made her migraines worse.  She states she has had a migraine for 4 wks, since Dr. Leta Baptist saw her.  She is taking tylenol.   Maxalt she states helps but she is out since last prescribed 07-03-17 ( 9 tabs).  She states she cannot come in for infusion today or tomorrow (as no driver).  Her husband gets off after 5p and would not be able to bring her either.  She has nausea as well.  Migraine today level 10.  I said she may need to go to ED for eval and infusion after her husband gets home.  Dr. Leta Baptist out, will send message to [facility].

## 2017-09-18 ENCOUNTER — Encounter: Payer: Self-pay | Admitting: Primary Care

## 2017-09-18 ENCOUNTER — Ambulatory Visit (INDEPENDENT_AMBULATORY_CARE_PROVIDER_SITE_OTHER): Payer: BLUE CROSS/BLUE SHIELD | Admitting: Primary Care

## 2017-09-18 VITALS — BP 120/82 | HR 71 | Temp 98.0°F | Ht 65.0 in | Wt 171.4 lb

## 2017-09-18 DIAGNOSIS — M25562 Pain in left knee: Secondary | ICD-10-CM | POA: Diagnosis not present

## 2017-09-18 DIAGNOSIS — F411 Generalized anxiety disorder: Secondary | ICD-10-CM | POA: Diagnosis not present

## 2017-09-18 MED ORDER — ESCITALOPRAM OXALATE 20 MG PO TABS: 20.0000 mg | ORAL_TABLET | Freq: Every day | ORAL | 1 refills | Status: DC

## 2017-09-18 MED ORDER — PREDNISONE 10 MG PO TABS: ORAL_TABLET | ORAL | 0 refills | Status: DC

## 2017-09-18 NOTE — Progress Notes (Signed)
Subjective:    Patient ID: [name], female    DOB: 1971-05-01, 47 y.o.   MRN: [id number]  HPI  [name] is a 47 year old female who presents today with a chief complaint of joint swelling. Her swelling is located to the left knee which has been present for the past 2 weeks.  She has noticed decrease in range of motion with flexion and extension, pain with putting pressure on the knee when first rising from a sitting or laying position. Her pain is intermittent and describes it as stiffness and pressure. She will occasionally experience sharp pain around the knee.   She kneels on her knees and walks up and down a ladder during work hours. She's not taken anything OTC for her symptoms. She denies recent injury/trauma, erythema, numbness/tingling.  Review of Systems  Musculoskeletal: Positive for arthralgias and joint swelling.       Left knee pain  Skin: Negative for color change.  Neurological: Negative for weakness and numbness.       Past Medical History:  Diagnosis Date  . Migraine   . Seizure [facility])      Social History   Socioeconomic History  . Marital status: Married    Spouse name: [name]  . Number of children: 0  . Years of education: 10th  . Highest education level: Not on file  Social Needs  . Financial resource strain: Not on file  . Food insecurity - worry: Not on file  . Food insecurity - inability: Not on file  . Transportation needs - medical: Not on file  . Transportation needs - non-medical: Not on file  Occupational History    Employer: [id number]  Tobacco Use  . Smoking status: Former Smoker    Packs/day: 1.00    Types: Cigarettes  . Smokeless tobacco: Never Used  . Tobacco comment: Quit Janurary 1st  Substance and Sexual Activity  . Alcohol use: No    Alcohol/week: 0.0 oz  . Drug use: No  . Sexual activity: Not on file  Other Topics Concern  . Not on file  Social History Narrative   Patient lives at home with spouse.   No children.   Works at [employer].   Enjoys going to [employer], going to estate sells.    Past Surgical History:  Procedure Laterality Date  . ABDOMINAL HYSTERECTOMY  2012    Family History  Problem Relation Age of Onset  . Hypertension Mother     Allergies  Allergen Reactions  . Asa Buff (Mag [Buffered Aspirin]   . Ibuprofen     GI upset  . Penicillins     childhood    Current Outpatient Medications on File Prior to Visit  Medication Sig Dispense Refill  . ondansetron (ZOFRAN ODT) 4 MG disintegrating tablet Take 1 tablet (4 mg total) every 8 (eight) hours as needed by mouth. 20 tablet 6  . rizatriptan (MAXALT-MLT) 10 MG disintegrating tablet Take 1 tablet (10 mg total) as needed by mouth for migraine. May repeat in 2 hours if needed 9 tablet 11  . SUMAtriptan (IMITREX) 6 MG/0.5ML SOLN injection Inject 0.5 mLs (6 mg total) every 2 (two) hours as needed into the skin for migraine or headache. May repeat in 2 hours if headache persists or recurs. 10 vial 11  . tiZANidine (ZANAFLEX) 4 MG tablet Take 1 tablet (4 mg total) every 6 (six) hours as needed by mouth for muscle spasms. 30 tablet 3  . topiramate (TOPAMAX) 50  MG tablet Take 1 tablet (50 mg total) 2 (two) times daily by mouth. 60 tablet 12  . PHENobarbital (LUMINAL) 97.2 MG tablet Take 1 tablet (97.2 mg total) daily by mouth. (Patient not taking: Reported on 09/18/2017) 30 tablet 5   No current facility-administered medications on file prior to visit.     BP 120/82   Pulse 71   Temp 98 F (36.7 C) (Oral)   Ht 5\' 5"  (1.651 m)   Wt 171 lb 6.4 oz (77.7 kg)   SpO2 98%   BMI 28.52 kg/m    Objective:   Physical Exam  Constitutional: She appears well-nourished.  Musculoskeletal:       Left knee: She exhibits decreased range of motion and swelling. She exhibits no erythema. No tenderness found.  Mild swelling noted to the area just proximal to the patella.  Mild limp with ambulation.  Overall good range of motion.  5 out of 5 strength to  bilateral lower extremities.  Skin: Skin is warm and dry. No erythema.          Assessment & Plan:  Bursitis:  Located to left knee times 2 weeks. Likely secondary to repetitive movement up and down the ladder and kneeling on her knees.  Exam today overall stable. Description for low-dose prednisone course sent to pharmacy for inflammation. Discussed knee exercises, handout provided. Discussed to purchase a knee sleeve for support. Return precautions provided.  Sheral Flow, NP

## 2017-09-18 NOTE — Assessment & Plan Note (Signed)
Needing refills of Lexapro.  Refill sent to pharmacy.  Denies SI/HI.

## 2017-09-18 NOTE — Patient Instructions (Signed)
Start prednisone tablets. Take three tablets for 3 days, then two tablets for 3 days, then one tablet for 3 days.  Purchase a knee sleeve to use for support when on your knee during the day.  Elevate the knee when resting at home.  Please notify me if no improvement in one week or sooner if symptoms get worse.  Start stretching as discussed.  It was a pleasure to see you today!   Knee Exercises Ask your health care provider which exercises are safe for you. Do exercises exactly as told by your health care provider and adjust them as directed. It is normal to feel mild stretching, pulling, tightness, or discomfort as you do these exercises, but you should stop right away if you feel sudden pain or your pain gets worse.Do not begin these exercises until told by your health care provider. STRETCHING AND RANGE OF MOTION EXERCISES These exercises warm up your muscles and joints and improve the movement and flexibility of your knee. These exercises also help to relieve pain, numbness, and tingling. Exercise A: Knee Extension, Prone 1. Lie on your abdomen on a bed. 2. Place your left / right knee just beyond the edge of the surface so your knee is not on the bed. You can put a towel under your left / right thigh just above your knee for comfort. 3. Relax your leg muscles and allow gravity to straighten your knee. You should feel a stretch behind your left / right knee. 4. Hold this position for __________ seconds. 5. Scoot up so your knee is supported between repetitions. Repeat __________ times. Complete this stretch __________ times a day. Exercise B: Knee Flexion, Active  1. Lie on your back with both knees straight. If this causes back discomfort, bend your left / right knee so your foot is flat on the floor. 2. Slowly slide your left / right heel back toward your buttocks until you feel a gentle stretch in the front of your knee or thigh. 3. Hold this position for __________  seconds. 4. Slowly slide your left / right heel back to the starting position. Repeat __________ times. Complete this exercise __________ times a day. Exercise C: Quadriceps, Prone  1. Lie on your abdomen on a firm surface, such as a bed or padded floor. 2. Bend your left / right knee and hold your ankle. If you cannot reach your ankle or pant leg, loop a belt around your foot and grab the belt instead. 3. Gently pull your heel toward your buttocks. Your knee should not slide out to the side. You should feel a stretch in the front of your thigh and knee. 4. Hold this position for __________ seconds. Repeat __________ times. Complete this stretch __________ times a day. Exercise D: Hamstring, Supine 1. Lie on your back. 2. Loop a belt or towel over the ball of your left / right foot. The ball of your foot is on the walking surface, right under your toes. 3. Straighten your left / right knee and slowly pull on the belt to raise your leg until you feel a gentle stretch behind your knee. ? Do not let your left / right knee bend while you do this. ? Keep your other leg flat on the floor. 4. Hold this position for __________ seconds. Repeat __________ times. Complete this stretch __________ times a day. STRENGTHENING EXERCISES These exercises build strength and endurance in your knee. Endurance is the ability to use your muscles for a long time,  even after they get tired. Exercise E: Quadriceps, Isometric  1. Lie on your back with your left / right leg extended and your other knee bent. Put a rolled towel or small pillow under your knee if told by your health care provider. 2. Slowly tense the muscles in the front of your left / right thigh. You should see your kneecap slide up toward your hip or see increased dimpling just above the knee. This motion will push the back of the knee toward the floor. 3. For __________ seconds, keep the muscle as tight as you can without increasing your  pain. 4. Relax the muscles slowly and completely. Repeat __________ times. Complete this exercise __________ times a day. Exercise F: Straight Leg Raises - Quadriceps 1. Lie on your back with your left / right leg extended and your other knee bent. 2. Tense the muscles in the front of your left / right thigh. You should see your kneecap slide up or see increased dimpling just above the knee. Your thigh may even shake a bit. 3. Keep these muscles tight as you raise your leg 4-6 inches (10-15 cm) off the floor. Do not let your knee bend. 4. Hold this position for __________ seconds. 5. Keep these muscles tense as you lower your leg. 6. Relax your muscles slowly and completely after each repetition. Repeat __________ times. Complete this exercise __________ times a day. Exercise G: Hamstring, Isometric 1. Lie on your back on a firm surface. 2. Bend your left / right knee approximately __________ degrees. 3. Dig your left / right heel into the surface as if you are trying to pull it toward your buttocks. Tighten the muscles in the back of your thighs to dig as hard as you can without increasing any pain. 4. Hold this position for __________ seconds. 5. Release the tension gradually and allow your muscles to relax completely for __________ seconds after each repetition. Repeat __________ times. Complete this exercise __________ times a day. Exercise H: Hamstring Curls  If told by your health care provider, do this exercise while wearing ankle weights. Begin with __________ weights. Then increase the weight by 1 lb (0.5 kg) increments. Do not wear ankle weights that are more than __________. 1. Lie on your abdomen with your legs straight. 2. Bend your left / right knee as far as you can without feeling pain. Keep your hips flat against the floor. 3. Hold this position for __________ seconds. 4. Slowly lower your leg to the starting position.  Repeat __________ times. Complete this exercise  __________ times a day. Exercise I: Squats (Quadriceps) 1. Stand in front of a table, with your feet and knees pointing straight ahead. You may rest your hands on the table for balance but not for support. 2. Slowly bend your knees and lower your hips like you are going to sit in a chair. ? Keep your weight over your heels, not over your toes. ? Keep your lower legs upright so they are parallel with the table legs. ? Do not let your hips go lower than your knees. ? Do not bend lower than told by your health care provider. ? If your knee pain increases, do not bend as low. 3. Hold the squat position for __________ seconds. 4. Slowly push with your legs to return to standing. Do not use your hands to pull yourself to standing. Repeat __________ times. Complete this exercise __________ times a day. Exercise J: Wall Slides (Quadriceps)  1. Lean your back  against a smooth wall or door while you walk your feet out 18-24 inches (46-61 cm) from it. 2. Place your feet hip-width apart. 3. Slowly slide down the wall or door until your knees bend __________ degrees. Keep your knees over your heels, not over your toes. Keep your knees in line with your hips. 4. Hold for __________ seconds. Repeat __________ times. Complete this exercise __________ times a day. Exercise K: Straight Leg Raises - Hip Abductors 1. Lie on your side with your left / right leg in the top position. Lie so your head, shoulder, knee, and hip line up. You may bend your bottom knee to help you keep your balance. 2. Roll your hips slightly forward so your hips are stacked directly over each other and your left / right knee is facing forward. 3. Leading with your heel, lift your top leg 4-6 inches (10-15 cm). You should feel the muscles in your outer hip lifting. ? Do not let your foot drift forward. ? Do not let your knee roll toward the ceiling. 4. Hold this position for __________ seconds. 5. Slowly return your leg to the starting  position. 6. Let your muscles relax completely after each repetition. Repeat __________ times. Complete this exercise __________ times a day. Exercise L: Straight Leg Raises - Hip Extensors 1. Lie on your abdomen on a firm surface. You can put a pillow under your hips if that is more comfortable. 2. Tense the muscles in your buttocks and lift your left / right leg about 4-6 inches (10-15 cm). Keep your knee straight as you lift your leg. 3. Hold this position for __________ seconds. 4. Slowly lower your leg to the starting position. 5. Let your leg relax completely after each repetition. Repeat __________ times. Complete this exercise __________ times a day. This information is not intended to replace advice given to you by your health care provider. Make sure you discuss any questions you have with your health care provider. Document Released: 06/28/2005 Document Revised: 05/08/2016 Document Reviewed: 06/20/2015 Elsevier Interactive Patient Education  2018 [employer].

## 2017-10-09 ENCOUNTER — Other Ambulatory Visit: Payer: Self-pay | Admitting: Primary Care

## 2017-10-09 DIAGNOSIS — E559 Vitamin D deficiency, unspecified: Secondary | ICD-10-CM

## 2017-10-09 DIAGNOSIS — E538 Deficiency of other specified B group vitamins: Secondary | ICD-10-CM

## 2017-10-09 DIAGNOSIS — Z0000 Encounter for general adult medical examination without abnormal findings: Secondary | ICD-10-CM

## 2017-10-19 ENCOUNTER — Other Ambulatory Visit: Payer: BLUE CROSS/BLUE SHIELD

## 2017-10-22 ENCOUNTER — Encounter: Payer: BLUE CROSS/BLUE SHIELD | Admitting: Primary Care

## 2017-11-30 ENCOUNTER — Ambulatory Visit (INDEPENDENT_AMBULATORY_CARE_PROVIDER_SITE_OTHER)
Admission: RE | Admit: 2017-11-30 | Discharge: 2017-11-30 | Disposition: A | Discharge status: Home / Self Care | Payer: BLUE CROSS/BLUE SHIELD | Source: Ambulatory Visit | Attending: Primary Care | Admitting: Primary Care

## 2017-11-30 ENCOUNTER — Encounter: Payer: Self-pay | Admitting: Primary Care

## 2017-11-30 ENCOUNTER — Ambulatory Visit (INDEPENDENT_AMBULATORY_CARE_PROVIDER_SITE_OTHER): Payer: BLUE CROSS/BLUE SHIELD | Admitting: Primary Care

## 2017-11-30 VITALS — BP 104/64 | HR 74 | Temp 98.3°F | Ht 65.0 in | Wt 160.5 lb

## 2017-11-30 DIAGNOSIS — R0789 Other chest pain: Secondary | ICD-10-CM

## 2017-11-30 DIAGNOSIS — K219 Gastro-esophageal reflux disease without esophagitis: Secondary | ICD-10-CM | POA: Diagnosis not present

## 2017-11-30 DIAGNOSIS — K228 Other specified diseases of esophagus: Secondary | ICD-10-CM | POA: Insufficient documentation

## 2017-11-30 DIAGNOSIS — K2289 Other specified disease of esophagus: Secondary | ICD-10-CM | POA: Insufficient documentation

## 2017-11-30 NOTE — Assessment & Plan Note (Addendum)
Symptoms of chest tightness, esophageal burning, belching, blood tinged oral mucous suggestive of GERD. Suspect blood tinged nasal mucosa to be secondary to dry nasal passageways.   ECG today with NSR with rate of 65, T-wave inversion in V2, no ST abnormalities.   Will start with omeprazole 20 mg daily. Also check labs including CBC, CMP, lipid panel. Chest xray ordered and pending. Consider stress test if symptoms develop during exertion and are relieved with rest, if no improvement on PPI.

## 2017-11-30 NOTE — Patient Instructions (Signed)
Stop by the lab and xray prior to leaving today. I will notify you of your results once received.   Start taking your Prilosec everyday for at least 4 weeks.  Please call me if you continue to notice your symptoms over the next week.   It was a pleasure to see you today!

## 2017-11-30 NOTE — Progress Notes (Signed)
Subjective:    Patient ID: [name], female    DOB: 01/29/1971, 47 y.o.   MRN: [id number]  HPI  Ms. Gengler is a 47 year old female who presents today with a chief complaint of chest tightness and blood tinged sputum and nasal mucous.   Her symptoms began Sunday this week when waking up. She noticed blood tinged nasal mucous when blowing her nose and blood tinged sputum. No symptoms Monday. Tuesday she was spitting up bright red blood and experienced a sore throat afterwards. Wednesday night she noticed substernal chest tightness that has been intermittent daily since. Her chest tightness will occur with light exertion and during rest. She has a family history of heart disease in her mother and extended family.   She also reports esophageal burning, belching, chest tightness/pain when eating. She denies nausea, vomiting, diaphoresis during chest tightness, radiation of pain, abdominal pain, rhinorrhea, post nasal drip, cough. She's never had an endoscopy. She has OTC Prilosec at home for which she doesn't take.   She was scheduled for a physical earlier this year but cancelled.   Review of Systems  HENT: Negative for congestion and sore throat.   Eyes: Negative for visual disturbance.  Respiratory: Negative for shortness of breath.   Cardiovascular:       Substernal chest tightness  Gastrointestinal: Negative for abdominal pain, blood in stool, constipation, diarrhea, nausea and vomiting.       Esophageal burning, belching, esophageal pain when eating.   Neurological: Negative for dizziness.       Past Medical History:  Diagnosis Date  . Migraine   . Seizure [facility])      Social History   Socioeconomic History  . Marital status: Married    Spouse name: [name]  . Number of children: 0  . Years of education: 10th  . Highest education level: Not on file  Occupational History    Employer: [employer]  . Financial resource strain: Not on file  . Food insecurity:      Worry: Not on file    Inability: Not on file  . Transportation needs:    Medical: Not on file    Non-medical: Not on file  Tobacco Use  . Smoking status: Former Smoker    Packs/day: 1.00    Types: Cigarettes  . Smokeless tobacco: Never Used  . Tobacco comment: Quit Janurary 1st  Substance and Sexual Activity  . Alcohol use: No    Alcohol/week: 0.0 oz  . Drug use: No  . Sexual activity: Not on file  Lifestyle  . Physical activity:    Days per week: Not on file    Minutes per session: Not on file  . Stress: Not on file  Relationships  . Social connections:    Talks on phone: Not on file    Gets together: Not on file    Attends religious service: Not on file    Active member of club or organization: Not on file    Attends meetings of clubs or organizations: Not on file    Relationship status: Not on file  . Intimate partner violence:    Fear of current or ex partner: Not on file    Emotionally abused: Not on file    Physically abused: Not on file    Forced sexual activity: Not on file  Other Topics Concern  . Not on file  Social History Narrative   Patient lives at home with spouse.   No  children.   Works at [employer].   Enjoys going to [employer], going to estate sells.    Past Surgical History:  Procedure Laterality Date  . ABDOMINAL HYSTERECTOMY  2012    Family History  Problem Relation Age of Onset  . Hypertension Mother     Allergies  Allergen Reactions  . Asa Buff (Mag [Buffered Aspirin]   . Ibuprofen     GI upset  . Penicillins     childhood    Current Outpatient Medications on File Prior to Visit  Medication Sig Dispense Refill  . escitalopram (LEXAPRO) 20 MG tablet Take 1 tablet (20 mg total) by mouth daily. 90 tablet 1  . rizatriptan (MAXALT-MLT) 10 MG disintegrating tablet Take 1 tablet (10 mg total) as needed by mouth for migraine. May repeat in 2 hours if needed 9 tablet 11  . SUMAtriptan (IMITREX) 6 MG/0.5ML SOLN injection Inject 0.5 mLs  (6 mg total) every 2 (two) hours as needed into the skin for migraine or headache. May repeat in 2 hours if headache persists or recurs. 10 vial 11  . tiZANidine (ZANAFLEX) 4 MG tablet Take 1 tablet (4 mg total) every 6 (six) hours as needed by mouth for muscle spasms. 30 tablet 3  . topiramate (TOPAMAX) 50 MG tablet Take 1 tablet (50 mg total) 2 (two) times daily by mouth. 60 tablet 12  . ondansetron (ZOFRAN ODT) 4 MG disintegrating tablet Take 1 tablet (4 mg total) every 8 (eight) hours as needed by mouth. (Patient not taking: Reported on 11/30/2017) 20 tablet 6  . PHENobarbital (LUMINAL) 97.2 MG tablet Take 1 tablet (97.2 mg total) daily by mouth. (Patient not taking: Reported on 09/18/2017) 30 tablet 5   No current facility-administered medications on file prior to visit.     BP 104/64   Pulse 74   Temp 98.3 F (36.8 C) (Oral)   Ht 5\' 5"  (1.651 m)   Wt 160 lb 8 oz (72.8 kg)   SpO2 98%   BMI 26.71 kg/m    Objective:   Physical Exam  Constitutional: She appears well-nourished.  Neck: Neck supple.  Cardiovascular: Normal rate and regular rhythm.  Pulmonary/Chest: Effort normal and breath sounds normal.  Abdominal: Soft. Bowel sounds are normal. There is no tenderness.  Skin: Skin is warm and dry.          Assessment & Plan:

## 2017-12-01 LAB — COMPREHENSIVE METABOLIC PANEL
AG Ratio: 1.5 (calc) (ref 1.0–2.5)
ALT: 14 U/L (ref 6–29)
AST: 14 U/L (ref 10–35)
Albumin: 4 g/dL (ref 3.6–5.1)
Alkaline phosphatase (APISO): 65 U/L (ref 33–115)
BUN: 12 mg/dL (ref 7–25)
CO2: 26 mmol/L (ref 20–32)
Calcium: 8.8 mg/dL (ref 8.6–10.2)
Chloride: 107 mmol/L (ref 98–110)
Creat: 0.63 mg/dL (ref 0.50–1.10)
Globulin: 2.7 g/dL (calc) (ref 1.9–3.7)
Glucose, Bld: 91 mg/dL (ref 65–99)
Potassium: 4 mmol/L (ref 3.5–5.3)
Sodium: 140 mmol/L (ref 135–146)
Total Bilirubin: 0.3 mg/dL (ref 0.2–1.2)
Total Protein: 6.7 g/dL (ref 6.1–8.1)

## 2017-12-01 LAB — CBC
HCT: 41.7 % (ref 35.0–45.0)
Hemoglobin: 14.5 g/dL (ref 11.7–15.5)
MCH: 31.8 pg (ref 27.0–33.0)
MCHC: 34.8 g/dL (ref 32.0–36.0)
MCV: 91.4 fL (ref 80.0–100.0)
MPV: 11.4 fL (ref 7.5–12.5)
Platelets: 209 10*3/uL (ref 140–400)
RBC: 4.56 10*6/uL (ref 3.80–5.10)
RDW: 12.5 % (ref 11.0–15.0)
WBC: 6.6 10*3/uL (ref 3.8–10.8)

## 2017-12-01 LAB — LIPID PANEL
Cholesterol: 195 mg/dL (ref <200)
HDL: 50 mg/dL — ABNORMAL LOW (ref >50)
LDL Cholesterol (Calc): 117 mg/dL (calc) — ABNORMAL HIGH
Non-HDL Cholesterol (Calc): 145 mg/dL (calc) — ABNORMAL HIGH (ref <130)
Total CHOL/HDL Ratio: 3.9 (calc) (ref <5.0)
Triglycerides: 167 mg/dL — ABNORMAL HIGH (ref <150)

## 2017-12-03 ENCOUNTER — Other Ambulatory Visit: Payer: Self-pay | Admitting: Primary Care

## 2017-12-03 DIAGNOSIS — K089 Disorder of teeth and supporting structures, unspecified: Secondary | ICD-10-CM

## 2017-12-04 ENCOUNTER — Telehealth: Payer: Self-pay

## 2017-12-04 NOTE — Telephone Encounter (Signed)
Left message for patient to call Keisha Amer back in regards to a referral-Zonia Caplin V Orien Mayhall, RMA   

## 2017-12-11 ENCOUNTER — Telehealth: Payer: Self-pay

## 2017-12-11 NOTE — Telephone Encounter (Signed)
Left message for patient to call [name] back in regards to a referral-[name] V [name], RMA   

## 2017-12-31 ENCOUNTER — Encounter: Payer: Self-pay | Admitting: Diagnostic Neuroimaging

## 2017-12-31 ENCOUNTER — Ambulatory Visit (INDEPENDENT_AMBULATORY_CARE_PROVIDER_SITE_OTHER): Payer: BLUE CROSS/BLUE SHIELD | Admitting: Diagnostic Neuroimaging

## 2017-12-31 VITALS — BP 103/66 | HR 67 | Ht 65.0 in | Wt 159.0 lb

## 2017-12-31 DIAGNOSIS — G43009 Migraine without aura, not intractable, without status migrainosus: Secondary | ICD-10-CM | POA: Diagnosis not present

## 2017-12-31 DIAGNOSIS — G40B09 Juvenile myoclonic epilepsy, not intractable, without status epilepticus: Secondary | ICD-10-CM

## 2017-12-31 MED ORDER — FREMANEZUMAB-VFRM 225 MG/1.5ML ~~LOC~~ SOSY: 225.0000 mg | PREFILLED_SYRINGE | SUBCUTANEOUS | 4 refills | Status: DC

## 2017-12-31 MED ORDER — RIZATRIPTAN BENZOATE 10 MG PO TBDP: 10.0000 mg | ORAL_TABLET | ORAL | 11 refills | Status: DC | PRN

## 2017-12-31 MED ORDER — PHENOBARBITAL 97.2 MG PO TABS: 194.4000 mg | ORAL_TABLET | Freq: Every day | ORAL | 5 refills | Status: DC

## 2017-12-31 NOTE — Progress Notes (Signed)
[facility]  PATIENT: [name] DOB: 1971-04-04  REFERRING CLINICIAN:  HISTORY FROM: patient REASON FOR VISIT: follow up   HISTORICAL  CHIEF COMPLAINT:  Chief Complaint  Patient presents with  . Follow-up    migraine; She has not been doing well with her migraines. She had one last night. She does not think that she takes Topamax.   Marland Kitchen Room 6    She is here alone    HISTORY OF PRESENT ILLNESS:   UPDATE (12/31/17, VRP): Since last visit, doing the same with HA (daily basis). Tried TPX, but HA worse. Rizatriptan helps. Tried lower PB, but had muscle spasms / myoclonus, but no grand mal seizures.   UPDATE (09/02/16, VRP): Since last visit, doing well. No seizures. Tolerating meds. No alleviating or aggravating factors. In last 3 weeks has had more HA / migraines (daily). Rizatriptan and tylenol not helping that much. Had cut down tylenol for a while, but now back up to 6 tabs per day. Drinking 1 soda per day. Eating 3 times per day. Sleep is a little better.   UPDATE (09/02/16, VRP): Since last visit, doing well --> no seizures. Tolerating meds. No alleviating or aggravating factors. Has had 3 weeks of consistent headaches. Taking up to tylenol x 6 tabs per day.   UPDATE 06/26/16: Since last visit, doing well. No seizures. Sleep is improved on her own. Tried on amitriptyline, but HA felt worse, so she stopped it. Having 2-3 migraine per month, well controlled with rizatriptan.  UPDATE 12/24/15: Since last visit, no seizures. Still with daily headaches. Also with 1 severe migraine per month. Eating 2 x per day. Soda (1 per day). Still with 6 tylenol tabs per day. Amitriptyline helped some in the beginning, but seems to be less effective now.   UPDATE 10/22/15: Since last visit, no seizures. Has cut down soda (now 1-2 per day), has quit smoking, and now exercising some. Still only eats 1 meal per day. Still taking 5-9 tabs tylenol on daily basis. Has 1 migraine per  month (pounding global HA, sens to light / sound). Also with daily lower level headaches. Still poor sleep quality.   UPDATE 10/21/14: Since last visit, no seizures. C/o daily headaches, and taking daily Tylenol. She has interrupted sleep at nighttime. She continues to drink 5-6 Dr. [name] cans per day. She eats once per day. She has increased stress related to work. Fortunately patient has been able to quit smoking cigarettes since 08/28/2014.  UPDATE 10/21/13: Since last visit, no new events or seizures. Now has [facility], but no PCP. Tolerating PB 97.2mg  tabs (2 tabs qAM).   UPDATE 05/29/12: No seizure. No PCP. No insurance.   UPDATE 12/22/10: No seizure since age 12's.  Tolerating PB, and wants to stay on it.  Having 1 migraine every 1-2 years (global, severe, pressure, photophobia, phonophobia).  Usually midrin helps.  Never tried triptan.  Also using tylenol for tension HA.  Last migraine Aug 2011.  PRIOR HPI (12/15/08, Dr. Gaynell Face): "[name] is a 47 year old married woman with a long-standing history of juvenile myoclonic epilepsy.  She has not had seizures for years.  The only manifestation of her symptoms has been generalized tonic-clonic seizures.  She has taken and tolerated Phenobarbital without significant side effects. Patient has daily headaches that I think are more tension type in nature than migraine.  Become on later in the day except when she has stress. Since her last visit, she had hysterectomy and oophorectomy for  removal of an ovarian cyst.  She was noted to have scarred fallopian tubes at the time of operation."   REVIEW OF SYSTEMS: Full 14 system review of systems performed and negative except headache.    ALLERGIES: Allergies  Allergen Reactions  . Asa Buff (Mag [Buffered Aspirin]   . Azithromycin     GI problems  . Ibuprofen     GI upset  . Penicillins     childhood    HOME MEDICATIONS: Outpatient Medications Prior to Visit  Medication Sig Dispense Refill  .  escitalopram (LEXAPRO) 20 MG tablet Take 1 tablet (20 mg total) by mouth daily. 90 tablet 1  . ondansetron (ZOFRAN ODT) 4 MG disintegrating tablet Take 1 tablet (4 mg total) every 8 (eight) hours as needed by mouth. 20 tablet 6  . PHENobarbital (LUMINAL) 97.2 MG tablet Take 1 tablet (97.2 mg total) daily by mouth. 30 tablet 5  . rizatriptan (MAXALT-MLT) 10 MG disintegrating tablet Take 1 tablet (10 mg total) as needed by mouth for migraine. May repeat in 2 hours if needed 9 tablet 11  . SUMAtriptan (IMITREX) 6 MG/0.5ML SOLN injection Inject 0.5 mLs (6 mg total) every 2 (two) hours as needed into the skin for migraine or headache. May repeat in 2 hours if headache persists or recurs. 10 vial 11  . tiZANidine (ZANAFLEX) 4 MG tablet Take 1 tablet (4 mg total) every 6 (six) hours as needed by mouth for muscle spasms. (Patient not taking: Reported on 12/31/2017) 30 tablet 3  . topiramate (TOPAMAX) 50 MG tablet Take 1 tablet (50 mg total) 2 (two) times daily by mouth. (Patient not taking: Reported on 12/31/2017) 60 tablet 12   No facility-administered medications prior to visit.     PAST MEDICAL HISTORY: Past Medical History:  Diagnosis Date  . Migraine   . Seizure ([place])     PAST SURGICAL HISTORY: Past Surgical History:  Procedure Laterality Date  . ABDOMINAL HYSTERECTOMY  2012    FAMILY HISTORY: Family History  Problem Relation Age of Onset  . Hypertension Mother     SOCIAL HISTORY:  Social History   Socioeconomic History  . Marital status: Married    Spouse name: [name]  . Number of children: 0  . Years of education: 10th  . Highest education level: Not on file  Occupational History    Employer: [employer]  . Financial resource strain: Not on file  . Food insecurity:    Worry: Not on file    Inability: Not on file  . Transportation needs:    Medical: Not on file    Non-medical: Not on file  Tobacco Use  . Smoking status: Former Smoker    Packs/day: 1.00    Types:  Cigarettes  . Smokeless tobacco: Never Used  . Tobacco comment: Quit Janurary 1st  Substance and Sexual Activity  . Alcohol use: No    Alcohol/week: 0.0 oz  . Drug use: No  . Sexual activity: Not on file  Lifestyle  . Physical activity:    Days per week: Not on file    Minutes per session: Not on file  . Stress: Not on file  Relationships  . Social connections:    Talks on phone: Not on file    Gets together: Not on file    Attends religious service: Not on file    Active member of club or organization: Not on file    Attends meetings of clubs or organizations: Not on file  Relationship status: Not on file  . Intimate partner violence:    Fear of current or ex partner: Not on file    Emotionally abused: Not on file    Physically abused: Not on file    Forced sexual activity: Not on file  Other Topics Concern  . Not on file  Social History Narrative   Patient lives at home with spouse.   No children.   Works at [employer].   Enjoys going to [employer], going to estate sells.   Right handed   Caffeine: 1 Dr. [name] every morning     PHYSICAL EXAM  Vitals:   12/31/17 1344  BP: 103/66  Pulse: 67  Weight: 159 lb (72.1 kg)  Height: 5\' 5"  (1.651 m)    Not recorded      Body mass index is 26.46 kg/m.  GENERAL EXAM: Patient is in no distress; well developed, nourished and groomed; neck is supple; POOR DENTITION.  CARDIOVASCULAR: Regular rate and rhythm, no murmurs, no carotid bruits  NEUROLOGIC: MENTAL STATUS: awake, alert, language fluent, comprehension intact, naming intact, fund of knowledge appropriate CRANIAL NERVE: pupils equal and reactive to light, visual fields full to confrontation, extraocular muscles intact, no nystagmus, facial sensation and strength symmetric, hearing intact, palate elevates symmetrically, uvula midline, shoulder shrug symmetric, tongue midline. MOTOR: normal bulk and tone, full strength in the BUE, BLE SENSORY: normal and symmetric  to light touch COORDINATION: finger-nose-finger, fine finger movements normal REFLEXES: deep tendon reflexes present and symmetric GAIT/STATION: narrow based gait; romberg is negative    DIAGNOSTIC DATA (LABS, IMAGING, TESTING) - I reviewed patient records, labs, notes, testing and imaging myself where available.  Lab Results  Component Value Date   WBC 6.6 11/30/2017   HGB 14.5 11/30/2017   HCT 41.7 11/30/2017   MCV 91.4 11/30/2017   PLT 209 11/30/2017      Component Value Date/Time   NA 140 11/30/2017 1549   NA 140 10/22/2015 1206   K 4.0 11/30/2017 1549   CL 107 11/30/2017 1549   CO2 26 11/30/2017 1549   GLUCOSE 91 11/30/2017 1549   BUN 12 11/30/2017 1549   BUN 9 10/22/2015 1206   CREATININE 0.63 11/30/2017 1549   CALCIUM 8.8 11/30/2017 1549   PROT 6.7 11/30/2017 1549   PROT 6.5 10/22/2015 1206   ALBUMIN 4.1 10/22/2015 1206   AST 14 11/30/2017 1549   ALT 14 11/30/2017 1549   ALKPHOS 59 10/22/2015 1206   BILITOT 0.3 11/30/2017 1549   BILITOT 0.2 10/22/2015 1206   GFRNONAA 110 10/22/2015 1206   GFRAA 127 10/22/2015 1206   Lab Results  Component Value Date   CHOL 195 11/30/2017   Lab Results  Component Value Date   HGBA1C 5.4 10/22/2015   Lab Results  Component Value Date   VITAMINB12 331 06/21/2017   Lab Results  Component Value Date   TSH 1.97 04/20/2017    11/18/15 MRI brain [I reviewed images myself and agree with interpretation. -VRP]  - normal     ASSESSMENT AND PLAN  47 y.o. year old female here with seizure d/o (JME) since childhood, but no seizure since 58.  Tolerating PB and wants to stay on it.  Also with chronic daily headache (tension) related to excess caffeine, poor nutrition, increased stress and poor sleep pattern.    Dx:  No diagnosis found.     PLAN:  HEADACHES / MIGRAINES (mixed migraine and tension; daily) - trial of ajovy --> CGRP antagonist (tried and  failed topiramate, rizatriptan, amitriptyline) - continue to  optimize nutrition, sleep hygeine, caffeine reduction and daily exercise - check sleep study for increasing headache evaluation, snoring, fatigue  JME/SEIZURES - continue phenobarbital 97.2mg  (2 tabs daily); could not tolerate reduced dose  DENTAL HYGEINE - follow up with dentist  STRESS/ANXIETY - continue lexapro  Meds ordered this encounter  Medications  . Fremanezumab-vfrm (AJOVY) 225 MG/1.5ML SOSY    Sig: Inject 225 mg into the skin every 30 (thirty) days.    Dispense:  3 Syringe    Refill:  4  . PHENobarbital (LUMINAL) 97.2 MG tablet    Sig: Take 2 tablets (194.4 mg total) by mouth at bedtime.    Dispense:  60 tablet    Refill:  5  . rizatriptan (MAXALT-MLT) 10 MG disintegrating tablet    Sig: Take 1 tablet (10 mg total) by mouth as needed for migraine. May repeat in 2 hours if needed    Dispense:  9 tablet    Refill:  11   Return in about 1 year (around 01/01/2019).    [name], MD 10/28/2023, 4:27 PM Certified in Neurology, Neurophysiology and Neuroimaging  [facility] [address] [place], [place] [address] [phone]

## 2018-01-02 ENCOUNTER — Telehealth: Payer: Self-pay | Admitting: *Deleted

## 2018-01-02 NOTE — Telephone Encounter (Signed)
PA submitted on CMM; approved 12/03/17 through 01/02/19. [employer], spoke with April and advised her of approval. She ran Rx through, and it went through however the patient did not receive any discount. April stated they do not have a discount card for patient . This RN stated will call patient to discuss.

## 2018-01-02 NOTE — Telephone Encounter (Addendum)
Spoke with patient and advised her of this RN's conversation with Educational psychologist. The patient stated she did not receive a discount card for Ajovy. This RN stated she will call pharmacist, give discount card information to hopefully get better outcome of cost. Patient verbalized understanding, appreciation. Dalbert Garnet, spoke with pharmacist, Larene Beach and advised her that the patient should have received a discount card for Ajovy. Larene Beach stated her co pay may still be high even with discount card. This RN asked to fax information to her. Larene Beach agreed to run through according to instructions on discount card. All information successfully faxed to [facility]. Ajovy discount card and information mailed to patient.

## 2018-02-04 ENCOUNTER — Other Ambulatory Visit: Payer: Self-pay | Admitting: Primary Care

## 2018-02-04 DIAGNOSIS — Z1231 Encounter for screening mammogram for malignant neoplasm of breast: Secondary | ICD-10-CM

## 2018-03-08 ENCOUNTER — Ambulatory Visit
Admission: RE | Admit: 2018-03-08 | Discharge: 2018-03-08 | Disposition: A | Discharge status: Home / Self Care | Payer: BLUE CROSS/BLUE SHIELD | Source: Ambulatory Visit | Attending: Primary Care | Admitting: Primary Care

## 2018-03-08 DIAGNOSIS — Z1231 Encounter for screening mammogram for malignant neoplasm of breast: Secondary | ICD-10-CM

## 2018-04-11 ENCOUNTER — Other Ambulatory Visit: Payer: Self-pay | Admitting: Primary Care

## 2018-04-11 DIAGNOSIS — F411 Generalized anxiety disorder: Secondary | ICD-10-CM

## 2018-05-08 ENCOUNTER — Telehealth: Payer: Self-pay | Admitting: Primary Care

## 2018-05-08 NOTE — Telephone Encounter (Signed)
Patient's mother was in the office. She saw me and ask if I can ask Allie Bossier to referral patient to podiatry for her foot pain. I see don't see where we saw her for this but let me know if patient needs to see you for the referral.

## 2018-05-08 NOTE — Telephone Encounter (Signed)
Yes, please schedule an office visit with me first. Thanks.

## 2018-05-08 NOTE — Telephone Encounter (Signed)
Spoken to patient and schedule appt on 05/15/2018

## 2018-05-15 ENCOUNTER — Encounter: Payer: Self-pay | Admitting: Primary Care

## 2018-05-15 ENCOUNTER — Ambulatory Visit (INDEPENDENT_AMBULATORY_CARE_PROVIDER_SITE_OTHER): Payer: BLUE CROSS/BLUE SHIELD | Admitting: Primary Care

## 2018-05-15 ENCOUNTER — Ambulatory Visit (INDEPENDENT_AMBULATORY_CARE_PROVIDER_SITE_OTHER)
Admission: RE | Admit: 2018-05-15 | Discharge: 2018-05-15 | Disposition: A | Discharge status: Home / Self Care | Payer: BLUE CROSS/BLUE SHIELD | Source: Ambulatory Visit | Attending: Primary Care | Admitting: Primary Care

## 2018-05-15 VITALS — BP 100/72 | HR 68 | Temp 98.0°F | Ht 65.0 in | Wt 158.8 lb

## 2018-05-15 DIAGNOSIS — R2243 Localized swelling, mass and lump, lower limb, bilateral: Secondary | ICD-10-CM | POA: Diagnosis not present

## 2018-05-15 DIAGNOSIS — M79673 Pain in unspecified foot: Secondary | ICD-10-CM | POA: Diagnosis not present

## 2018-05-15 DIAGNOSIS — G8929 Other chronic pain: Secondary | ICD-10-CM | POA: Insufficient documentation

## 2018-05-15 DIAGNOSIS — M720 Palmar fascial fibromatosis [Dupuytren]: Secondary | ICD-10-CM

## 2018-05-15 NOTE — Assessment & Plan Note (Signed)
With soft masses to bilateral plantar arches. Check plain films today. Referral placed to podiatry.

## 2018-05-15 NOTE — Assessment & Plan Note (Signed)
Noted to bilateral 5th digits and right 1st digit.  Referral placed to hand surgery for further evaluation.

## 2018-05-15 NOTE — Patient Instructions (Signed)
Complete xray(s) prior to leaving today. I will notify you of your results once received.  Stop by the front desk and speak with Rosaria Ferries regarding your referral to the hand specialist and foot specialist.   It was a pleasure to see you today!

## 2018-05-15 NOTE — Progress Notes (Signed)
Subjective:    Patient ID: [name], female    DOB: 12/29/1970, 47 y.o.   MRN: [id number]  HPI  Ms. Schweiger is a 47 year old female who presents today with a chief complaint of hand and foot pain.  Her foot pain is located to the bilateral planter feet to the arch that have been present for the last one year. The pain is caused by masses to the plantar feet that have gradually increased over the last year. She also reports dorsal foot pain to the right foot. She has not seen a podiatrist.   She also reports contractures to her bilateral 5th digits, right first digit that has been noticeable and progressing over the last one year. She's been unable to move her digits. She denies swelling to the joints of her fingers.   Review of Systems  Musculoskeletal: Positive for arthralgias. Negative for joint swelling.       See HPI  Skin: Negative for color change.       Past Medical History:  Diagnosis Date  . Migraine   . Seizure [facility])      Social History   Socioeconomic History  . Marital status: Married    Spouse name: [name]  . Number of children: 0  . Years of education: 10th  . Highest education level: Not on file  Occupational History    Employer: [employer]  . Financial resource strain: Not on file  . Food insecurity:    Worry: Not on file    Inability: Not on file  . Transportation needs:    Medical: Not on file    Non-medical: Not on file  Tobacco Use  . Smoking status: Former Smoker    Packs/day: 1.00    Types: Cigarettes  . Smokeless tobacco: Never Used  . Tobacco comment: Quit Janurary 1st  Substance and Sexual Activity  . Alcohol use: No    Alcohol/week: 0.0 standard drinks  . Drug use: No  . Sexual activity: Not on file  Lifestyle  . Physical activity:    Days per week: Not on file    Minutes per session: Not on file  . Stress: Not on file  Relationships  . Social connections:    Talks on phone: Not on file    Gets together: Not on  file    Attends religious service: Not on file    Active member of club or organization: Not on file    Attends meetings of clubs or organizations: Not on file    Relationship status: Not on file  . Intimate partner violence:    Fear of current or ex partner: Not on file    Emotionally abused: Not on file    Physically abused: Not on file    Forced sexual activity: Not on file  Other Topics Concern  . Not on file  Social History Narrative   Patient lives at home with spouse.   No children.   Works at [employer].   Enjoys going to [employer], going to estate sells.   Right handed   Caffeine: 1 Dr. [name] every morning    Past Surgical History:  Procedure Laterality Date  . ABDOMINAL HYSTERECTOMY  2012    Family History  Problem Relation Age of Onset  . Hypertension Mother     Allergies  Allergen Reactions  . Asa Buff (Mag [Buffered Aspirin]   . Azithromycin     GI problems  . Ibuprofen  GI upset  . Penicillins     childhood    Current Outpatient Medications on File Prior to Visit  Medication Sig Dispense Refill  . escitalopram (LEXAPRO) 20 MG tablet TAKE 1 TABLET BY MOUTH ONCE DAILY 90 tablet 0  . Fremanezumab-vfrm (AJOVY) 225 MG/1.5ML SOSY Inject 225 mg into the skin every 30 (thirty) days. 3 Syringe 4  . ondansetron (ZOFRAN ODT) 4 MG disintegrating tablet Take 1 tablet (4 mg total) every 8 (eight) hours as needed by mouth. 20 tablet 6  . PHENobarbital (LUMINAL) 97.2 MG tablet Take 2 tablets (194.4 mg total) by mouth at bedtime. 60 tablet 5  . rizatriptan (MAXALT-MLT) 10 MG disintegrating tablet Take 1 tablet (10 mg total) by mouth as needed for migraine. May repeat in 2 hours if needed 9 tablet 11   No current facility-administered medications on file prior to visit.     BP 100/72   Pulse 68   Temp 98 F (36.7 C) (Oral)   Ht 5\' 5"  (1.651 m)   Wt 158 lb 12 oz (72 kg)   SpO2 98%   BMI 26.42 kg/m    Objective:   Physical Exam  Constitutional: She  appears well-nourished.  Musculoskeletal:       Feet:  Obvious dupuytren's contractures to hands at bilateral 5th digits and right first digit. Masses to bilateral plantar feet at arches.   Skin: Skin is warm and dry. No erythema.           Assessment & Plan:

## 2018-05-27 DIAGNOSIS — M720 Palmar fascial fibromatosis [Dupuytren]: Secondary | ICD-10-CM | POA: Insufficient documentation

## 2018-05-27 DIAGNOSIS — M20011 Mallet finger of right finger(s): Secondary | ICD-10-CM | POA: Insufficient documentation

## 2018-05-27 DIAGNOSIS — M79642 Pain in left hand: Secondary | ICD-10-CM

## 2018-05-27 DIAGNOSIS — M79641 Pain in right hand: Secondary | ICD-10-CM | POA: Insufficient documentation

## 2018-05-31 ENCOUNTER — Other Ambulatory Visit: Payer: Self-pay

## 2018-05-31 ENCOUNTER — Ambulatory Visit (INDEPENDENT_AMBULATORY_CARE_PROVIDER_SITE_OTHER): Payer: BLUE CROSS/BLUE SHIELD | Admitting: Podiatry

## 2018-05-31 ENCOUNTER — Encounter: Payer: Self-pay | Admitting: Podiatry

## 2018-05-31 VITALS — BP 119/73 | HR 68

## 2018-05-31 DIAGNOSIS — M722 Plantar fascial fibromatosis: Secondary | ICD-10-CM

## 2018-05-31 DIAGNOSIS — M79671 Pain in right foot: Secondary | ICD-10-CM | POA: Diagnosis not present

## 2018-05-31 DIAGNOSIS — D219 Benign neoplasm of connective and other soft tissue, unspecified: Secondary | ICD-10-CM

## 2018-05-31 DIAGNOSIS — M79672 Pain in left foot: Secondary | ICD-10-CM

## 2018-05-31 DIAGNOSIS — R569 Unspecified convulsions: Secondary | ICD-10-CM | POA: Insufficient documentation

## 2018-05-31 MED ORDER — NONFORMULARY OR COMPOUNDED ITEM: 1.0000 g | Freq: Four times a day (QID) | 2 refills | Status: DC

## 2018-05-31 NOTE — Progress Notes (Signed)
Subjective:  Patient ID: [name], female    DOB: 10/22/1970,  MRN: [id number]  Chief Complaint  Patient presents with  . Plantar Fasciitis    bilateral plantar fibromas - very painful - pain started almost a year ago - they have gotten bigger and more painful over time. Xrays done on 05/16/18 by PCP    47 y.o. female presents with the above complaint.  Reports painful lesions to the bottom of both feet.  Also having pain in both heels.  States they are very painful.  She has a lot of pain in her feet when she wakes up in the morning.  Start a year ago.  Works as a Teacher, English as a foreign language at [employer].  Had x-rays performed 05/06/2018.  Has a history of Dupuytren's contractures to the hands.  Review of Systems: Negative except as noted in the HPI. Denies N/V/F/Ch.  Past Medical History:  Diagnosis Date  . Migraine   . Seizure ([place])     Current Outpatient Medications:  .  escitalopram (LEXAPRO) 20 MG tablet, TAKE 1 TABLET BY MOUTH ONCE DAILY, Disp: 90 tablet, Rfl: 0 .  Fremanezumab-vfrm (AJOVY) 225 MG/1.5ML SOSY, Inject 225 mg into the skin every 30 (thirty) days., Disp: 3 Syringe, Rfl: 4 .  NONFORMULARY OR COMPOUNDED ITEM, Apply 1-2 g topically 4 (four) times daily. [facility]  Scar Cream -  Verapamil 10%, Pentoxifylline 5% Apply 1-2 grams to affected area 3-4 times daily Qty. 120 gm 3 refills, Disp: 120 each, Rfl: 2 .  ondansetron (ZOFRAN ODT) 4 MG disintegrating tablet, Take 1 tablet (4 mg total) every 8 (eight) hours as needed by mouth., Disp: 20 tablet, Rfl: 6 .  PHENobarbital (LUMINAL) 97.2 MG tablet, Take 2 tablets (194.4 mg total) by mouth at bedtime., Disp: 60 tablet, Rfl: 5 .  rizatriptan (MAXALT-MLT) 10 MG disintegrating tablet, Take 1 tablet (10 mg total) by mouth as needed for migraine. May repeat in 2 hours if needed, Disp: 9 tablet, Rfl: 11 .  tiZANidine (ZANAFLEX) 4 MG tablet, , Disp: , Rfl:   Social History   Tobacco Use  Smoking Status Former Smoker  . Packs/day:  1.00  . Types: Cigarettes  Smokeless Tobacco Never Used  Tobacco Comment   Quit Janurary 1st    Allergies  Allergen Reactions  . Asa Buff (Mag [Buffered Aspirin]   . Azithromycin     GI problems  . Ibuprofen     GI upset  . Penicillins     childhood   Objective:   Vitals:   05/31/18 0934  BP: 119/73  Pulse: 68   There is no height or weight on file to calculate BMI. Constitutional Well developed. Well nourished.  Vascular Dorsalis pedis pulses palpable bilaterally. Posterior tibial pulses palpable bilaterally. Capillary refill normal to all digits.  No cyanosis or clubbing noted. Pedal hair growth normal.  Neurologic Normal speech. Oriented to person, place, and time. Epicritic sensation to light touch grossly present bilaterally.  Dermatologic Nails well groomed and normal in appearance. No open wounds. No skin lesions.  Orthopedic: Normal joint ROM without pain or crepitus bilaterally. No visible deformities. Tender to palpation at the calcaneal tuber bilaterally. No pain with calcaneal squeeze bilaterally. Ankle ROM diminished range of motion bilaterally. Silfverskiold Test: positive bilaterally. Large nodules present to the medial band of the plantar fascia bilaterally, left with additional large mass lateral and plantar fascia.   Radiographs: Prior x-rays 05/16/2018.  Reviewed no evidence of acute fracture.  Assessment:   1.  Plantar fasciitis   2. Pain of both heels   3. Fibroma   4. Arch pain of left foot   5. Arch pain of right foot    Plan:  Patient was evaluated and treated and all questions answered.  Plantar Fasciitis, bilaterally - XR reviewed as above.  - Educated on icing and stretching. Instructions given.  - Injection delivered to the plantar fascia as below. - DME: Night splint dispensed. - Pharmacologic management: Rx compound verapamil cream for severe 100 fibromas. -Discussed possible removal of the plantar fibromas in the future  however at this point the lesion is so large that they would require resection of a large amount of the plantar fascia.  We will attempt to treat lesions with verapamil gel.  Would likely benefit from injection therapy in the future.  Procedure: Injection Tendon/Ligament Location: Bilateral plantar fascia at the glabrous junction; medial approach. Skin Prep: alcohol Injectate: 1 cc 0.5% marcaine plain, 1 cc dexamethasone phosphate, 0.5 cc kenalog 40. Disposition: Patient tolerated procedure well. Injection site dressed with a band-aid.  Return in about 3 weeks (around 06/21/2018) for Plantar fasciitis, Bilateral.

## 2018-06-10 ENCOUNTER — Emergency Department (HOSPITAL_COMMUNITY)
Admission: EM | Admit: 2018-06-10 | Discharge: 2018-06-10 | Disposition: A | Discharge status: Home / Self Care | Payer: BLUE CROSS/BLUE SHIELD | Attending: Emergency Medicine | Admitting: Emergency Medicine

## 2018-06-10 ENCOUNTER — Ambulatory Visit: Payer: Self-pay

## 2018-06-10 ENCOUNTER — Emergency Department (HOSPITAL_COMMUNITY): Payer: BLUE CROSS/BLUE SHIELD

## 2018-06-10 ENCOUNTER — Encounter (HOSPITAL_COMMUNITY): Payer: Self-pay | Admitting: Emergency Medicine

## 2018-06-10 DIAGNOSIS — R079 Chest pain, unspecified: Secondary | ICD-10-CM | POA: Diagnosis present

## 2018-06-10 DIAGNOSIS — Z87891 Personal history of nicotine dependence: Secondary | ICD-10-CM | POA: Diagnosis not present

## 2018-06-10 DIAGNOSIS — Z79899 Other long term (current) drug therapy: Secondary | ICD-10-CM | POA: Insufficient documentation

## 2018-06-10 LAB — CBC
HCT: 44 % (ref 36.0–46.0)
Hemoglobin: 14.7 g/dL (ref 12.0–15.0)
MCH: 32.7 pg (ref 26.0–34.0)
MCHC: 33.4 g/dL (ref 30.0–36.0)
MCV: 97.8 fL (ref 80.0–100.0)
Platelets: 222 10*3/uL (ref 150–400)
RBC: 4.5 MIL/uL (ref 3.87–5.11)
RDW: 11.7 % (ref 11.5–15.5)
WBC: 7.8 10*3/uL (ref 4.0–10.5)
nRBC: 0 % (ref 0.0–0.2)

## 2018-06-10 LAB — I-STAT TROPONIN, ED
Troponin i, poc: 0 ng/mL (ref 0.00–0.08)
Troponin i, poc: 0 ng/mL (ref 0.00–0.08)

## 2018-06-10 LAB — BASIC METABOLIC PANEL
Anion gap: 6 (ref 5–15)
BUN: 9 mg/dL (ref 6–20)
CO2: 26 mmol/L (ref 22–32)
Calcium: 8.9 mg/dL (ref 8.9–10.3)
Chloride: 105 mmol/L (ref 98–111)
Creatinine, Ser: 0.81 mg/dL (ref 0.44–1.00)
GFR calc Af Amer: >60 mL/min (ref >60)
GFR calc non Af Amer: >60 mL/min (ref >60)
Glucose, Bld: 94 mg/dL (ref 70–99)
Potassium: 4.3 mmol/L (ref 3.5–5.1)
Sodium: 137 mmol/L (ref 135–145)

## 2018-06-10 LAB — D-DIMER, QUANTITATIVE: D-Dimer, Quant: <0.27 ug/mL-FEU (ref 0.00–0.50)

## 2018-06-10 MED ORDER — ACETAMINOPHEN 500 MG PO TABS
1000.0000 mg | ORAL_TABLET | Freq: Once | ORAL | Status: AC
  Administered 2018-06-10: 1000 mg via ORAL
  Filled 2018-06-10: qty 2

## 2018-06-10 MED ORDER — NITROGLYCERIN 0.4 MG SL SUBL
0.4000 mg | SUBLINGUAL_TABLET | SUBLINGUAL | Status: DC | PRN
  Administered 2018-06-10: 0.4 mg via SUBLINGUAL
  Filled 2018-06-10: qty 1

## 2018-06-10 NOTE — Telephone Encounter (Signed)
Pt. Reports she started having chest pain Saturday night. Reports it lasted "for hours until I fell asleep." The pain came back Sunday and "lasted all day." She had pain in her neck yesterday. Currently has chest pain that she rates a "2" on a scale of 1-10. Also has shortness of breath this morning. Reports Ms. Clark saw her in the past for chest pain and said it was "reflux", "but this feels different from that." Recommend pt. Go to the ED for evaluation. States she will have her husband take her.    Reason for Disposition . Difficulty breathing  Answer Assessment - Initial Assessment Questions 1. LOCATION: "Where does it hurt?"        Hurts in the middle 2. RADIATION: "Does the pain go anywhere else?" (e.g., into neck, jaw, arms, back)     Neck hurt yesterday 3. ONSET: "When did the chest pain begin?" (Minutes, hours or days)      Saturday night 4. PATTERN "Does the pain come and go, or has it been constant since it started?"  "Does it get worse with exertion?"      Comes and goes 5. DURATION: "How long does it last" (e.g., seconds, minutes, hours)     Lasts for hours 6. SEVERITY: "How bad is the pain?"  (e.g., Scale 1-10; mild, moderate, or severe)    - MILD (1-3): doesn't interfere with normal activities     - MODERATE (4-7): interferes with normal activities or awakens from sleep    - SEVERE (8-10): excruciating pain, unable to do any normal activities       2 7. CARDIAC RISK FACTORS: "Do you have any history of heart problems or risk factors for heart disease?" (e.g., prior heart attack, angina; high blood pressure, diabetes, being overweight, high cholesterol, smoking, or strong family history of heart disease)     Runs in the family 8. PULMONARY RISK FACTORS: "Do you have any history of lung disease?"  (e.g., blood clots in lung, asthma, emphysema, birth control pills)     No 9. CAUSE: "What do you think is causing the chest pain?"     Unsure 10. OTHER SYMPTOMS: "Do you have any  other symptoms?" (e.g., dizziness, nausea, vomiting, sweating, fever, difficulty breathing, cough)       Short of breath 11. PREGNANCY: "Is there any chance you are pregnant?" "When was your last menstrual period?"       No  Protocols used: CHEST PAIN-A-AH

## 2018-06-10 NOTE — ED Triage Notes (Signed)
Pt reports centralized chest pain non radiating since Saturday with shortness of breath pain is currently 3/10. Saturday the pain was in her neck. Denies any other symptoms.

## 2018-06-10 NOTE — ED Notes (Signed)
Pt headache increased from 6 to 10 after one NTG. Chest pain remains a 2/10 Pt refused additional NTG.

## 2018-06-10 NOTE — ED Provider Notes (Signed)
[place] EMERGENCY DEPARTMENT Provider Note   CSN: [id number] Arrival date & time: 06/10/18  1021     History   Chief Complaint Chief Complaint  Patient presents with  . Chest Pain    HPI [name] is a 47 y.o. female.  HPI   CP started Saturday, heart started racing, blood pressure went to 147 Sunday got up and tried to go to work but chest was tight and hurting and spitting up blood, reports didn't cough or vomit. Reports it just appeared in mouth. Happened before and thought it was acid reflux, took something for that and that got better. Yesterday pain there but when getting up it is the worst  Associated dyspnea Tightness middle and left side, did have some radiation to neck, laying down helps. Worse with walking to bathroom and kitchen. No nausea or vomiting, no diaphoresis No leg pain or swelling  Now pain free with laying down, was 3/10 before  No smoking, no other drugs  No hx of htn, hchl,DM, DVT/PE, no recent surgeries or immobilization Mom had triple bypass, was probably in her 62s, grandpa had pacemaker. Heart problems and htn      Past Medical History:  Diagnosis Date  . Migraine   . Seizure [facility])     Patient Active Problem List   Diagnosis Date Noted  . Seizures ([place]) 05/31/2018  . Acquired mallet finger of right hand 05/27/2018  . Bilateral hand pain 05/27/2018  . 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 Chronic foot pain 05/15/2018  . Dupuytren's contracture of both hands 05/15/2018  . GERD (gastroesophageal reflux disease) 11/30/2017  . Rash and nonspecific skin eruption 04/20/2017  . GAD (generalized anxiety disorder) 04/20/2017  . 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 Migraine without aura and without status migrainosus, not intractable 06/26/2016  . Seizure ([place]) 12/24/2015    Past Surgical History:  Procedure Laterality Date  . ABDOMINAL HYSTERECTOMY   2012     OB History   None      Home Medications    Prior to Admission medications   Medication Sig Start Date End Date Taking? Authorizing Provider  acetaminophen (TYLENOL) 500 MG tablet Take 1,500 mg by mouth 3 (three) times daily as needed for headache (pain).   Yes [provider]  escitalopram (LEXAPRO) 20 MG tablet TAKE 1 TABLET BY MOUTH ONCE DAILY Patient taking differently: Take 20 mg by mouth daily.  04/12/18  Yes Pleas Koch, NP  Fremanezumab-vfrm (AJOVY) 225 MG/1.5ML SOSY Inject 225 mg into the skin every 30 (thirty) days. Patient taking differently: Inject 225 mg into the skin every 30 (thirty) days. Last injection mid September, 2019 12/31/17  Yes Penumalli, Earlean Polka, MD  ondansetron (ZOFRAN ODT) 4 MG disintegrating tablet Take 1 tablet (4 mg total) every 8 (eight) hours as needed by mouth. Patient taking differently: Take 4 mg by mouth every 8 (eight) hours as needed (nausea from migraine).  07/16/17  Yes Marcial Pacas, MD  PHENobarbital (LUMINAL) 97.2 MG tablet Take 2 tablets (194.4 mg total) by mouth at bedtime. Patient taking differently: Take 194.4 mg by mouth daily.  12/31/17  Yes Penumalli, Earlean Polka, MD  rizatriptan (MAXALT-MLT) 10 MG disintegrating tablet Take 1 tablet (10 mg total) by mouth as needed for migraine. May repeat in 2 hours if needed Patient taking differently: Take 10 mg by mouth See admin instructions. Dissolve one tablet (10 mg) under tongue at onset of migraine -  may repeat in 2 hours if still needed 12/31/17  Yes Penumalli, Earlean Polka, MD  tiZANidine (ZANAFLEX) 4 MG tablet Take 4 mg by mouth daily as needed (migraine).  03/19/18  Yes [provider]  NONFORMULARY OR COMPOUNDED ITEM Apply 1-2 g topically 4 (four) times daily. [facility]  Scar Cream -  Verapamil 10%, Pentoxifylline 5% Apply 1-2 grams to affected area 3-4 times daily Qty. 120 gm 3 refills Patient taking differently: Apply 1-2 g topically See admin instructions.  [facility]  Scar Cream -  Verapamil 10%, Pentoxifylline 5% Apply 1-2 grams to affected area (feet) 3-4 times daily Qty. 120 gm 3 refills 05/31/18   Evelina Bucy, DPM    Family History Family History  Problem Relation Age of Onset  . Hypertension Mother     Social History Social History   Tobacco Use  . Smoking status: Former Smoker    Packs/day: 1.00    Types: Cigarettes  . Smokeless tobacco: Never Used  . Tobacco comment: Quit Janurary 1st  Substance Use Topics  . Alcohol use: No    Alcohol/week: 0.0 standard drinks  . Drug use: No     Allergies   Asa buff (mag [buffered aspirin]; Azithromycin; Ibuprofen; and Penicillins   Review of Systems Review of Systems  Constitutional: Negative for fever.  HENT: Negative for sore throat.   Eyes: Negative for visual disturbance.  Respiratory: Positive for shortness of breath. Negative for cough.   Cardiovascular: Positive for chest pain.  Gastrointestinal: Negative for abdominal pain, nausea and vomiting.  Genitourinary: Negative for difficulty urinating.  Musculoskeletal: Negative for back pain and neck pain.  Skin: Negative for rash.  Neurological: Positive for headaches. Negative for syncope.     Physical Exam Updated Vital Signs BP 107/75   Pulse 73   Temp 97.9 F (36.6 C) (Oral)   Resp 16   Ht 5\' 5"  (1.651 m)   Wt 71.7 kg   SpO2 95%   BMI 26.29 kg/m   Physical Exam  Constitutional: She is oriented to person, place, and time. She appears well-developed and well-nourished. No distress.  HENT:  Head: Normocephalic and atraumatic.  Eyes: Conjunctivae and EOM are normal.  Neck: Normal range of motion.  Cardiovascular: Normal rate, regular rhythm, normal heart sounds and intact distal pulses. Exam reveals no gallop and no friction rub.  No murmur heard. Pulmonary/Chest: Effort normal and breath sounds normal. No respiratory distress. She has no wheezes. She has no rales.  Abdominal: Soft. She  exhibits no distension. There is no tenderness. There is no guarding.  Musculoskeletal: She exhibits no edema or tenderness.  Neurological: She is alert and oriented to person, place, and time.  Skin: Skin is warm and dry. No rash noted. She is not diaphoretic. No erythema.  Nursing note and vitals reviewed.    ED Treatments / Results  Labs (all labs ordered are listed, but only abnormal results are displayed) Labs Reviewed  BASIC METABOLIC PANEL  CBC  D-DIMER, QUANTITATIVE (NOT AT [facility])  I-STAT TROPONIN, ED  I-STAT TROPONIN, ED    EKG EKG Interpretation  Date/Time:  Monday June 10 2018 18:28:24 EDT Ventricular Rate:  63 PR Interval:  142 QRS Duration: 100 QT Interval:  415 QTC Calculation: 425 R Axis:   25 Text Interpretation:  Sinus rhythm No significant change since last tracing Confirmed by Gareth Morgan [phone]) on 06/10/2018 6:33:14 PM   Radiology Dg Chest 2 View  Result Date: 06/10/2018 CLINICAL DATA:  Chest pain.  EXAM: CHEST - 2 VIEW COMPARISON:  Radiographs of November 30, 2017. FINDINGS: The heart size and mediastinal contours are within normal limits. Both lungs are clear. No pneumothorax or pleural effusion is noted. The visualized skeletal structures are unremarkable. IMPRESSION: No active cardiopulmonary disease. Electronically Signed   By: Marijo Conception, M.D.   On: 06/10/2018 10:54    Procedures Procedures (including critical care time)  Medications Ordered in ED Medications  acetaminophen (TYLENOL) tablet 1,000 mg (1,000 mg Oral Given 06/10/18 1618)     Initial Impression / Assessment and Plan / ED Course  I have reviewed the triage vital signs and the nursing notes.  Pertinent labs & imaging results that were available during my care of the patient were reviewed by me and considered in my medical decision making (see chart for details).     47 year old female with history of myoclonic epilepsy presents with concern for chest pain.   Differential diagnosis for chest pain includes pulmonary embolus, dissection, pneumothorax, pneumonia, ACS, myocarditis, pericarditis.  EKG was done and evaluate by me and showed no acute ST changes and no signs of pericarditis. Chest x-ray was done and evaluated by me and radiology and showed no sign of pneumonia or pneumothorax. Patient is low risk [place] with a negative ddimer and have low suspicion for PE.  Patient is low risk HEART score (3) and had delta troponins which were both negative. Nitro did not change pain.  Given this evaluation, history and physical have low suspicion for pulmonary embolus, pneumonia, ACS, myocarditis, pericarditis, dissection.   Possible reflux given pt reporting having bloody spit up/suspect reflux---however given her symptoms and family history, recommend close follow up with Cardiology and discussed reasons to return in detail. Patient discharged in stable condition with understanding of reasons to return.    Final Clinical Impressions(s) / ED Diagnoses   Final diagnoses:  Chest pain, unspecified type    ED Discharge Orders    None       Gareth Morgan, MD 06/11/18 [phone]

## 2018-06-10 NOTE — ED Notes (Signed)
Pt alert and oriented in NAD. Pt verbalized understanding of discharge instructions. 

## 2018-06-10 NOTE — Telephone Encounter (Signed)
Noted, patient currently in the ED. ECG is within normal limits and troponin is negative.

## 2018-06-18 ENCOUNTER — Encounter: Payer: Self-pay | Admitting: Primary Care

## 2018-06-18 ENCOUNTER — Ambulatory Visit (INDEPENDENT_AMBULATORY_CARE_PROVIDER_SITE_OTHER): Payer: BLUE CROSS/BLUE SHIELD | Admitting: Primary Care

## 2018-06-18 VITALS — BP 104/70 | HR 67 | Ht 65.0 in | Wt 162.0 lb

## 2018-06-18 DIAGNOSIS — R079 Chest pain, unspecified: Secondary | ICD-10-CM | POA: Diagnosis not present

## 2018-06-18 DIAGNOSIS — R0602 Shortness of breath: Secondary | ICD-10-CM

## 2018-06-18 NOTE — Patient Instructions (Signed)
Follow up with cardiology as scheduled.   It was a pleasure to see you today!

## 2018-06-18 NOTE — Assessment & Plan Note (Signed)
Daily since October 12th, with and without exertion. Work up in ED unremarkable, exam today unremarkable.  Will have her follow up with cardiology as scheduled.

## 2018-06-18 NOTE — Progress Notes (Signed)
Subjective:    Patient ID: [name], female    DOB: 11-20-1970, 47 y.o.   MRN: [id number]  HPI  [name] is a 47 year old female who presents today for emergency department follow up.  She presented to [facility] on 06/10/18 with a chief complaint of chest pain, palpitations, hypertension. She also endorsed "spitting up blood" without cough or vomiting. Her chest pain was located to the mid left anterior chest with radiation to the neck.   During her stay in the ED her ECG was without acute ST changes or signs of pericarditis. Chest xray was unremarkable. Labs with negative D-Dimer, negative troponin levels x 3. She did not respond with nitroglycerin. Given her work up her symptoms were determined not to be cardiac in nature so she was discharged home with recommendations for cardiology and PCP follow up.   Since her emergency department visit she continues to experience shortness of breath and chest pain. She will experience shortness of breath with mild exertion, this has been present since October 12th. . Her chest pain is predominately to the left chest that will occur with both rest and exertion, overall has improved.   BP Readings from Last 3 Encounters:  06/18/18 104/70  06/10/18 107/75  05/31/18 119/73   She has an appointment with cardiology on November 8th. She quit smoking four years ago, previously smoked cigarettes for 30 years prior. She denies pain with deep inspiration, cough, wheezing, congestion.   Review of Systems  Constitutional: Negative for fever.  Respiratory: Positive for shortness of breath. Negative for cough.   Cardiovascular: Positive for chest pain. Negative for palpitations.  Neurological: Negative for dizziness.       Past Medical History:  Diagnosis Date  . Migraine   . Seizure [facility])      Social History   Socioeconomic History  . Marital status: Married    Spouse name: [name]  . Number of children: 0  . Years of education: 10th  . Highest  education level: Not on file  Occupational History    Employer: [employer]  . Financial resource strain: Not on file  . Food insecurity:    Worry: Not on file    Inability: Not on file  . Transportation needs:    Medical: Not on file    Non-medical: Not on file  Tobacco Use  . Smoking status: Former Smoker    Packs/day: 1.00    Types: Cigarettes  . Smokeless tobacco: Never Used  . Tobacco comment: Quit Janurary 1st  Substance and Sexual Activity  . Alcohol use: No    Alcohol/week: 0.0 standard drinks  . Drug use: No  . Sexual activity: Not on file  Lifestyle  . Physical activity:    Days per week: Not on file    Minutes per session: Not on file  . Stress: Not on file  Relationships  . Social connections:    Talks on phone: Not on file    Gets together: Not on file    Attends religious service: Not on file    Active member of club or organization: Not on file    Attends meetings of clubs or organizations: Not on file    Relationship status: Not on file  . Intimate partner violence:    Fear of current or ex partner: Not on file    Emotionally abused: Not on file    Physically abused: Not on file    Forced sexual activity: Not  on file  Other Topics Concern  . Not on file  Social History Narrative   Patient lives at home with spouse.   No children.   Works at [employer].   Enjoys going to [employer], going to estate sells.   Right handed   Caffeine: 1 Dr. [name] every morning    Past Surgical History:  Procedure Laterality Date  . ABDOMINAL HYSTERECTOMY  2012    Family History  Problem Relation Age of Onset  . Hypertension Mother     Allergies  Allergen Reactions  . Asa Buff (Mag [Buffered Aspirin] Other (See Comments)    Stomach pain  . Azithromycin Other (See Comments)    GI problems  . Ibuprofen Other (See Comments)    GI upset  . Penicillins Other (See Comments)    Unknown childhood allergic reaction Has patient had a PCN reaction causing  immediate rash, facial/tongue/throat swelling, SOB or lightheadedness with hypotension: Unknown Has patient had a PCN reaction causing severe rash involving mucus membranes or skin necrosis: Unknown Has patient had a PCN reaction that required hospitalization: Unknown Has patient had a PCN reaction occurring within the last 10 years: No If all of the above answers are "NO", then may proceed with Cephalosporin use.    Current Outpatient Medications on File Prior to Visit  Medication Sig Dispense Refill  . acetaminophen (TYLENOL) 500 MG tablet Take 1,500 mg by mouth 3 (three) times daily as needed for headache (pain).    Marland Kitchen escitalopram (LEXAPRO) 20 MG tablet TAKE 1 TABLET BY MOUTH ONCE DAILY (Patient taking differently: Take 20 mg by mouth daily. ) 90 tablet 0  . Fremanezumab-vfrm (AJOVY) 225 MG/1.5ML SOSY Inject 225 mg into the skin every 30 (thirty) days. (Patient taking differently: Inject 225 mg into the skin every 30 (thirty) days. Last injection mid September, 2019) 3 Syringe 4  . NONFORMULARY OR COMPOUNDED ITEM Apply 1-2 g topically 4 (four) times daily. [facility]  Scar Cream -  Verapamil 10%, Pentoxifylline 5% Apply 1-2 grams to affected area 3-4 times daily Qty. 120 gm 3 refills (Patient taking differently: Apply 1-2 g topically See admin instructions. [facility]  Scar Cream -  Verapamil 10%, Pentoxifylline 5% Apply 1-2 grams to affected area (feet) 3-4 times daily Qty. 120 gm 3 refills) 120 each 2  . ondansetron (ZOFRAN ODT) 4 MG disintegrating tablet Take 1 tablet (4 mg total) every 8 (eight) hours as needed by mouth. (Patient taking differently: Take 4 mg by mouth every 8 (eight) hours as needed (nausea from migraine). ) 20 tablet 6  . PHENobarbital (LUMINAL) 97.2 MG tablet Take 2 tablets (194.4 mg total) by mouth at bedtime. (Patient taking differently: Take 194.4 mg by mouth daily. ) 60 tablet 5  . rizatriptan (MAXALT-MLT) 10 MG disintegrating tablet Take 1 tablet  (10 mg total) by mouth as needed for migraine. May repeat in 2 hours if needed (Patient taking differently: Take 10 mg by mouth See admin instructions. Dissolve one tablet (10 mg) under tongue at onset of migraine -may repeat in 2 hours if still needed) 9 tablet 11  . tiZANidine (ZANAFLEX) 4 MG tablet Take 4 mg by mouth daily as needed (migraine).      No current facility-administered medications on file prior to visit.     BP 104/70 (BP Location: Left Arm, Patient Position: Sitting, Cuff Size: Normal)   Pulse 67   Ht 5\' 5"  (1.651 m)   Wt 162 lb (73.5 kg)   SpO2 98%  BMI 26.96 kg/m    Objective:   Physical Exam  Constitutional: She appears well-nourished.  Neck: Neck supple.  Cardiovascular: Normal rate and regular rhythm.  Respiratory: Effort normal and breath sounds normal. She has no wheezes.  Skin: Skin is warm and dry.           Assessment & Plan:

## 2018-06-18 NOTE — Assessment & Plan Note (Addendum)
Daily with mild exertion. Work up including chest xray and d-dimer negative during visit. History of tobacco abuse for 30 years, quit four years ago. Lungs clear on exam.  Check spirometry today pre and post bronchodilator. Very minimal improvement in FVC and FEV1. Will have her follow up with cardiology.

## 2018-06-21 ENCOUNTER — Ambulatory Visit: Payer: BLUE CROSS/BLUE SHIELD | Admitting: Podiatry

## 2018-06-27 ENCOUNTER — Encounter: Payer: Self-pay | Admitting: Podiatry

## 2018-07-01 ENCOUNTER — Other Ambulatory Visit: Payer: Self-pay | Admitting: Diagnostic Neuroimaging

## 2018-07-01 NOTE — Telephone Encounter (Signed)
Phenobarbital refill Rx on Dr [employer] desk for signature.

## 2018-07-05 ENCOUNTER — Encounter: Payer: Self-pay | Admitting: Internal Medicine

## 2018-07-05 ENCOUNTER — Ambulatory Visit (INDEPENDENT_AMBULATORY_CARE_PROVIDER_SITE_OTHER): Payer: BLUE CROSS/BLUE SHIELD | Admitting: Internal Medicine

## 2018-07-05 VITALS — BP 118/70 | HR 68 | Ht 65.0 in | Wt 164.0 lb

## 2018-07-05 DIAGNOSIS — R0602 Shortness of breath: Secondary | ICD-10-CM | POA: Diagnosis not present

## 2018-07-05 DIAGNOSIS — R0789 Other chest pain: Secondary | ICD-10-CM

## 2018-07-05 DIAGNOSIS — R0609 Other forms of dyspnea: Secondary | ICD-10-CM | POA: Diagnosis not present

## 2018-07-05 DIAGNOSIS — R002 Palpitations: Secondary | ICD-10-CM | POA: Diagnosis not present

## 2018-07-05 LAB — TSH: TSH: 1.32 u[IU]/mL (ref 0.450–4.500)

## 2018-07-05 NOTE — Consult Note (Signed)
Cardiology Office Note:    Date:  07/05/2018   ID:  [name], DOB 08/25/1971, MRN [id number]  PCP:  Pleas Koch, NP  Cardiologist:  No primary care provider on file.  Electrophysiologist:  None   Referring MD: Pleas Koch, NP   Dyspnea on exertion, palpitations  History of Present Illness:    [name] is a 47 y.o. female with a hx of migraine headaches and seizure disorder who presents today for evaluation of chest pain, dyspnea on exertion, and palpitations.  On 06/10/2018 she presented to the [facility] emergency department with chest discomfort, palpitations, shortness of breath, and hypertension.  On 06/08/2018 she had an episode of palpitations that lasted approximately 2 to 3 minutes, without other symptoms.  She woke up the following morning and was spitting up blood in the shower.  These caused her enough concerned that she stayed home from work.  On 06/10/2018 she presented to the emergency department with the above-mentioned concerns.  Her evaluation revealed negative troponins x3, negative d-dimer, nonischemic electrocardiogram, and chest discomfort and shortness of breath not responsive to nitroglycerin.  She describes her chest discomfort over the left chest wall and is tender to palpation.  If she does not palpate her chest, she will not have chest discomfort.  She denies exertional chest discomfort.  She notes to dyspnea on exertion with minimal activity.  She works at [employer] and notes that when she is carrying light objects she is able to be as active as she would like, however when she is moving heavy objects such as laundry detergents from a top shelf to the bottom shelf she will have dyspnea which requires her to stop her activity and recover.  Coworkers have commented on her dyspnea and asked if she is okay.  She is a former 30-60-pack-year smoker.  She also has a referral pending to the sleep center.  She canceled her appointment previously,  however I have encouraged her to make this appointment again.  Her Epworth Sleepiness Scale score is approximately 8.  She also notes palpitations that have been occurring several times this month the last 2 to 3 minutes and are not associated with presyncope or syncope.  She has not recently had her TSH checked but it has been normal in the past.  Her family history is significant for her mother who had an MI in her 36s with subsequent CABG.  Her maternal grandfather had a pacemaker placed and also had "heart problems".  Hypertension runs in her mother's family.  She denies chest pressure, PND, orthopnea. Denies syncope or presyncope. Denies dizziness or lightheadedness.  Endorses snoring and has not yet been evaluated for sleep apnea.  Endorses mild bilateral ankle edema on occasion.  Past Medical History:  Diagnosis Date  . Migraine   . Seizure [facility])     Past Surgical History:  Procedure Laterality Date  . ABDOMINAL HYSTERECTOMY  2012    Current Medications: Current Meds  Medication Sig  . escitalopram (LEXAPRO) 20 MG tablet TAKE 1 TABLET BY MOUTH ONCE DAILY (Patient taking differently: Take 20 mg by mouth daily. )  . Fremanezumab-vfrm (AJOVY) 225 MG/1.5ML SOSY Inject 225 mg into the skin every 30 (thirty) days. (Patient taking differently: Inject 225 mg into the skin every 30 (thirty) days. Last injection mid September, 2019)  . NONFORMULARY OR COMPOUNDED ITEM Apply 1-2 g topically 4 (four) times daily. [facility]  Scar Cream -  Verapamil 10%, Pentoxifylline 5% Apply 1-2 grams  to affected area 3-4 times daily Qty. 120 gm 3 refills (Patient taking differently: Apply 1-2 g topically See admin instructions. [facility]  Scar Cream -  Verapamil 10%, Pentoxifylline 5% Apply 1-2 grams to affected area (feet) 3-4 times daily Qty. 120 gm 3 refills)  . PHENobarbital (LUMINAL) 97.2 MG tablet TAKE 2 TABLETS BY MOUTH AT BEDTIME  . rizatriptan (MAXALT-MLT) 10 MG disintegrating  tablet Take 1 tablet (10 mg total) by mouth as needed for migraine. May repeat in 2 hours if needed (Patient taking differently: Take 10 mg by mouth See admin instructions. Dissolve one tablet (10 mg) under tongue at onset of migraine -may repeat in 2 hours if still needed)  . tiZANidine (ZANAFLEX) 4 MG tablet Take 4 mg by mouth daily as needed (migraine).      Allergies:   Asa buff (mag [buffered aspirin]; Azithromycin; Ibuprofen; and Penicillins   Social History   Socioeconomic History  . Marital status: Married    Spouse name: [name]  . Number of children: 0  . Years of education: 10th  . Highest education level: Not on file  Occupational History    Employer: [employer]  . Financial resource strain: Not on file  . Food insecurity:    Worry: Not on file    Inability: Not on file  . Transportation needs:    Medical: Not on file    Non-medical: Not on file  Tobacco Use  . Smoking status: Former Smoker    Packs/day: 1.00    Types: Cigarettes  . Smokeless tobacco: Never Used  . Tobacco comment: Quit Janurary 1st  Substance and Sexual Activity  . Alcohol use: No    Alcohol/week: 0.0 standard drinks  . Drug use: No  . Sexual activity: Not on file  Lifestyle  . Physical activity:    Days per week: Not on file    Minutes per session: Not on file  . Stress: Not on file  Relationships  . Social connections:    Talks on phone: Not on file    Gets together: Not on file    Attends religious service: Not on file    Active member of club or organization: Not on file    Attends meetings of clubs or organizations: Not on file    Relationship status: Not on file  Other Topics Concern  . Not on file  Social History Narrative   Patient lives at home with spouse.   No children.   Works at [employer].   Enjoys going to [employer], going to estate sells.   Right handed   Caffeine: 1 Dr. [name] every morning     Family History: The patient's family history includes  Hypertension in her mother.  ROS:   Please see the history of present illness.    All other systems reviewed and are negative.  EKGs/Labs/Other Studies Reviewed:    The following studies were reviewed today:  EKG:  EKG is ordered today.  The ekg ordered today demonstrates normal sinus rhythm ventricular rate of 68.  Recent Labs: 11/30/2017: ALT 14 06/10/2018: BUN 9; Creatinine, Ser 0.81; Hemoglobin 14.7; Platelets 222; Potassium 4.3; Sodium 137  Recent Lipid Panel    Component Value Date/Time   CHOL 195 11/30/2017 1549   TRIG 167 (H) 11/30/2017 1549   HDL 50 (L) 11/30/2017 1549   CHOLHDL 3.9 11/30/2017 1549   LDLCALC 117 (H) 11/30/2017 1549    Physical Exam:    VS:  BP 118/70  Pulse 68   Ht 5\' 5"  (1.651 m)   Wt 164 lb (74.4 kg)   BMI 27.29 kg/m     Wt Readings from Last 3 Encounters:  07/05/18 164 lb (74.4 kg)  06/18/18 162 lb (73.5 kg)  06/10/18 158 lb (71.7 kg)    Constitutional: No acute distress Eyes: pupils equally round and reactive to light, sclera non-icteric, normal conjunctiva and lids ENMT: poor dentition, moist mucous membranes Cardiovascular: regular rhythm, normal rate, no murmurs. S1 and S2 normal. Radial pulses normal bilaterally. No jugular venous distention.  Respiratory: clear to auscultation bilaterally GI : normal bowel sounds, soft and nontender. No distention.   MSK: extremities warm, well perfused. No edema.  NEURO: grossly nonfocal exam, moves all extremities. PSYCH: alert and oriented x 3, normal mood and affect.   ASSESSMENT:    1. Dyspnea on exertion   2. SOB (shortness of breath)   3. Palpitations   4. Chest wall pain    PLAN:    1. Dyspnea on exertion   2. SOB (shortness of breath)   3. Palpitations   4. Chest wall pain   Her chest pain seems most likely musculoskeletal given that it is tender to palpation and does not occur unless she palpates her left upper chest wall.  It is not associated with exertion.  She does have risk  factors including family history, former smoking.  If she develops exertional chest discomfort or has concerning features on her echocardiogram we will consider stress testing.  For her dyspnea on exertion we will obtain an echocardiogram.  I am suspicious for sleep apnea, and with that she may have pulmonary hypertension.  We will also evaluate for diastolic dysfunction.  For palpitations we will obtain an event monitor.  We will also obtain a TSH since she has not had that done in the recent past.  I will see the patient in follow-up in approximately 3 months time, and will contact her with results as they are available.   Medication Adjustments/Labs and Tests Ordered: Current medicines are reviewed at length with the patient today.  Concerns regarding medicines are outlined above.  Orders Placed This Encounter  Procedures  . TSH  . Cardiac event monitor  . EKG 12-Lead  . ECHOCARDIOGRAM COMPLETE   No orders of the defined types were placed in this encounter.   Patient Instructions  Medication Instructions:  Your physician recommends that you continue on your current medications as directed. Please refer to the Current Medication list given to you today.  If you need a refill on your cardiac medications before your next appointment, please call your pharmacy.   Lab work: TSH today  If you have labs (blood work) drawn today and your tests are completely normal, you will receive your results only by: Marland Kitchen MyChart Message (if you have MyChart) OR . A paper copy in the mail If you have any lab test that is abnormal or we need to change your treatment, we will call you to review the results.  Testing/Procedures: Your physician has requested that you have an echocardiogram. Echocardiography is a painless test that uses sound waves to create images of your heart. It provides your doctor with information about the size and shape of your heart and how well your heart's chambers and valves  are working. This procedure takes approximately one hour. There are no restrictions for this procedure.  Your physician has recommended that you wear an event monitor. Event monitors are medical devices that  record the heart's electrical activity. Doctors most often [place] these monitors to diagnose arrhythmias. Arrhythmias are problems with the speed or rhythm of the heartbeat. The monitor is a small, portable device. You can wear one while you do your normal daily activities. This is usually used to diagnose what is causing palpitations/syncope (passing out).    Follow-Up: At [facility], you and your health needs are our priority.  As part of our continuing mission to provide you with exceptional heart care, we have created designated Provider Care Teams.  These Care Teams include your primary Cardiologist (physician) and Advanced Practice Providers (APPs -  Physician Assistants and Nurse Practitioners) who all work together to provide you with the care you need, when you need it. You will need a follow up appointment in 3 months with Dr.Quanisha Drewry.    Any Other Special Instructions Will Be Listed Below (If Applicable). Please call the [place] sleep center to reschedule your sleep study       Signed, Elouise Munroe, MD  07/05/2018 9:00 AM    [place]

## 2018-07-05 NOTE — Patient Instructions (Addendum)
Medication Instructions:  Your physician recommends that you continue on your current medications as directed. Please refer to the Current Medication list given to you today.  If you need a refill on your cardiac medications before your next appointment, please call your pharmacy.   Lab work: TSH today  If you have labs (blood work) drawn today and your tests are completely normal, you will receive your results only by: Marland Kitchen MyChart Message (if you have MyChart) OR . A paper copy in the mail If you have any lab test that is abnormal or we need to change your treatment, we will call you to review the results.  Testing/Procedures: Your physician has requested that you have an echocardiogram. Echocardiography is a painless test that uses sound waves to create images of your heart. It provides your doctor with information about the size and shape of your heart and how well your heart's chambers and valves are working. This procedure takes approximately one hour. There are no restrictions for this procedure.  Your physician has recommended that you wear an event monitor. Event monitors are medical devices that record the heart's electrical activity. Doctors most often [place] these monitors to diagnose arrhythmias. Arrhythmias are problems with the speed or rhythm of the heartbeat. The monitor is a small, portable device. You can wear one while you do your normal daily activities. This is usually used to diagnose what is causing palpitations/syncope (passing out).    Follow-Up: At [facility], you and your health needs are our priority.  As part of our continuing mission to provide you with exceptional heart care, we have created designated Provider Care Teams.  These Care Teams include your primary Cardiologist (physician) and Advanced Practice Providers (APPs -  Physician Assistants and Nurse Practitioners) who all work together to provide you with the care you need, when you need it. You will need a  follow up appointment in 3 months with Dr.Acharya.    Any Other Special Instructions Will Be Listed Below (If Applicable). Please call the [place] sleep center to reschedule your sleep study

## 2018-07-12 ENCOUNTER — Ambulatory Visit: Payer: BLUE CROSS/BLUE SHIELD | Admitting: Podiatry

## 2018-07-14 ENCOUNTER — Other Ambulatory Visit: Payer: Self-pay | Admitting: Primary Care

## 2018-07-14 DIAGNOSIS — F411 Generalized anxiety disorder: Secondary | ICD-10-CM

## 2018-07-19 ENCOUNTER — Other Ambulatory Visit (HOSPITAL_COMMUNITY): Payer: BLUE CROSS/BLUE SHIELD

## 2018-09-30 ENCOUNTER — Encounter: Payer: Self-pay | Admitting: Primary Care

## 2018-09-30 ENCOUNTER — Ambulatory Visit: Payer: BLUE CROSS/BLUE SHIELD | Admitting: Primary Care

## 2018-09-30 VITALS — BP 110/80 | HR 105 | Temp 98.7°F | Ht 65.0 in | Wt 163.0 lb

## 2018-09-30 DIAGNOSIS — B9789 Other viral agents as the cause of diseases classified elsewhere: Secondary | ICD-10-CM | POA: Diagnosis not present

## 2018-09-30 DIAGNOSIS — J069 Acute upper respiratory infection, unspecified: Secondary | ICD-10-CM

## 2018-09-30 MED ORDER — BENZONATATE 200 MG PO CAPS: 200.0000 mg | ORAL_CAPSULE | Freq: Three times a day (TID) | ORAL | 0 refills | Status: DC | PRN

## 2018-09-30 NOTE — Progress Notes (Signed)
Subjective:    Patient ID: [name], female    DOB: 12/01/1970, 48 y.o.   MRN: [id number]  HPI  [name] is a 47 year old female with a history of GERD who presents today with a chief complaint of sore throat.   She also reports cough, nasal congestion, fatigue. Her symptoms began two days ago. She did not have a flu shot. She's not checked her temperature. She's been taking Nyquil with some improvement. She denies sick contacts, sinus pressure.   Review of Systems  Constitutional: Positive for chills. Negative for fever.  HENT: Positive for congestion, postnasal drip and sore throat. Negative for ear pain and sinus pressure.   Respiratory: Positive for cough. Negative for shortness of breath.        Past Medical History:  Diagnosis Date  . Migraine   . Seizure [facility])      Social History   Socioeconomic History  . Marital status: Married    Spouse name: [name]  . Number of children: 0  . Years of education: 10th  . Highest education level: Not on file  Occupational History    Employer: [employer]  . Financial resource strain: Not on file  . Food insecurity:    Worry: Not on file    Inability: Not on file  . Transportation needs:    Medical: Not on file    Non-medical: Not on file  Tobacco Use  . Smoking status: Former Smoker    Packs/day: 1.00    Types: Cigarettes  . Smokeless tobacco: Never Used  . Tobacco comment: Quit Janurary 1st  Substance and Sexual Activity  . Alcohol use: No    Alcohol/week: 0.0 standard drinks  . Drug use: No  . Sexual activity: Not on file  Lifestyle  . Physical activity:    Days per week: Not on file    Minutes per session: Not on file  . Stress: Not on file  Relationships  . Social connections:    Talks on phone: Not on file    Gets together: Not on file    Attends religious service: Not on file    Active member of club or organization: Not on file    Attends meetings of clubs or organizations: Not on file     Relationship status: Not on file  . Intimate partner violence:    Fear of current or ex partner: Not on file    Emotionally abused: Not on file    Physically abused: Not on file    Forced sexual activity: Not on file  Other Topics Concern  . Not on file  Social History Narrative   Patient lives at home with spouse.   No children.   Works at [employer].   Enjoys going to [employer], going to estate sells.   Right handed   Caffeine: 1 Dr. [name] every morning    Past Surgical History:  Procedure Laterality Date  . ABDOMINAL HYSTERECTOMY  2012    Family History  Problem Relation Age of Onset  . Hypertension Mother     Allergies  Allergen Reactions  . Asa Buff (Mag [Buffered Aspirin] Other (See Comments)    Stomach pain  . Azithromycin Other (See Comments)    GI problems  . Ibuprofen Other (See Comments)    GI upset  . Penicillins Other (See Comments)    Unknown childhood allergic reaction Has patient had a PCN reaction causing immediate rash, facial/tongue/throat swelling, SOB or  lightheadedness with hypotension: Unknown Has patient had a PCN reaction causing severe rash involving mucus membranes or skin necrosis: Unknown Has patient had a PCN reaction that required hospitalization: Unknown Has patient had a PCN reaction occurring within the last 10 years: No If all of the above answers are "NO", then may proceed with Cephalosporin use.    Current Outpatient Medications on File Prior to Visit  Medication Sig Dispense Refill  . acetaminophen (TYLENOL) 500 MG tablet Take 1,500 mg by mouth 3 (three) times daily as needed for headache (pain).    Marland Kitchen escitalopram (LEXAPRO) 20 MG tablet TAKE 1 TABLET BY MOUTH ONCE DAILY 90 tablet 0  . Fremanezumab-vfrm (AJOVY) 225 MG/1.5ML SOSY Inject 225 mg into the skin every 30 (thirty) days. (Patient taking differently: Inject 225 mg into the skin every 30 (thirty) days. Last injection mid September, 2019) 3 Syringe 4  . NONFORMULARY OR  COMPOUNDED ITEM Apply 1-2 g topically 4 (four) times daily. [facility]  Scar Cream -  Verapamil 10%, Pentoxifylline 5% Apply 1-2 grams to affected area 3-4 times daily Qty. 120 gm 3 refills (Patient taking differently: Apply 1-2 g topically See admin instructions. [facility]  Scar Cream -  Verapamil 10%, Pentoxifylline 5% Apply 1-2 grams to affected area (feet) 3-4 times daily Qty. 120 gm 3 refills) 120 each 2  . ondansetron (ZOFRAN ODT) 4 MG disintegrating tablet Take 1 tablet (4 mg total) every 8 (eight) hours as needed by mouth. 20 tablet 6  . PHENobarbital (LUMINAL) 97.2 MG tablet TAKE 2 TABLETS BY MOUTH AT BEDTIME 60 tablet 5  . rizatriptan (MAXALT-MLT) 10 MG disintegrating tablet Take 1 tablet (10 mg total) by mouth as needed for migraine. May repeat in 2 hours if needed (Patient taking differently: Take 10 mg by mouth See admin instructions. Dissolve one tablet (10 mg) under tongue at onset of migraine -may repeat in 2 hours if still needed) 9 tablet 11  . tiZANidine (ZANAFLEX) 4 MG tablet Take 4 mg by mouth daily as needed (migraine).      No current facility-administered medications on file prior to visit.     BP 110/80   Pulse (!) 105   Temp 98.7 F (37.1 C) (Oral)   Ht 5\' 5"  (1.651 m)   Wt 163 lb (73.9 kg)   SpO2 93%   BMI 27.12 kg/m    Objective:   Physical Exam  Constitutional: She appears well-nourished. She appears ill.  HENT:  Right Ear: Tympanic membrane and ear canal normal.  Left Ear: Tympanic membrane and ear canal normal.  Nose: No mucosal edema. Right sinus exhibits no maxillary sinus tenderness and no frontal sinus tenderness. Left sinus exhibits no maxillary sinus tenderness and no frontal sinus tenderness.  Mouth/Throat: Oropharynx is clear and moist.  Neck: Neck supple.  Cardiovascular: Regular rhythm.  Sinus tachycardia  Respiratory: Effort normal and breath sounds normal. She has no wheezes.  Skin: Skin is warm and dry.            Assessment & Plan:  Viral URI with Cough:  Cough, chills, fatigue x 2 days. Exam today with clear lungs, does appear acutely ill. Rapid flu today: Negative  Suspect viral involvement and will treat with conservative measures. Rx for Benzonatate capsules sent to pharmacy. Discussed use of Mucinex, Tylenol PRN. Fluids, rest, follow up PRN.  Pleas Koch, NP

## 2018-09-30 NOTE — Patient Instructions (Signed)
Your symptoms are representative of a viral illness which will resolve on its own over time. Our goal is to treat your symptoms in order to aid your body in the healing process and to make you more comfortable.   You may take Benzonatate capsules for cough. Take 1 capsule by mouth three times daily as needed for cough.  Cough/Congestion: Try taking Mucinex DM. This will help loosen up the mucous in your chest. Ensure you take this medication with a full glass of water.  Please notify me if you develop persistent fevers of 101 and/or feel worse after 1 week of onset of symptoms.   Increase consumption of water intake and rest.  It was a pleasure to see you today!

## 2018-10-07 ENCOUNTER — Emergency Department (HOSPITAL_COMMUNITY)
Admission: EM | Admit: 2018-10-07 | Discharge: 2018-10-08 | Disposition: A | Discharge status: Home / Self Care | Payer: BLUE CROSS/BLUE SHIELD | Attending: Emergency Medicine | Admitting: Emergency Medicine

## 2018-10-07 ENCOUNTER — Encounter (HOSPITAL_COMMUNITY): Payer: Self-pay

## 2018-10-07 DIAGNOSIS — Z87891 Personal history of nicotine dependence: Secondary | ICD-10-CM | POA: Insufficient documentation

## 2018-10-07 DIAGNOSIS — Z79899 Other long term (current) drug therapy: Secondary | ICD-10-CM | POA: Diagnosis not present

## 2018-10-07 DIAGNOSIS — R112 Nausea with vomiting, unspecified: Secondary | ICD-10-CM | POA: Diagnosis not present

## 2018-10-07 DIAGNOSIS — R51 Headache: Secondary | ICD-10-CM | POA: Diagnosis present

## 2018-10-07 NOTE — ED Triage Notes (Signed)
Pt had teeth pulled and dentures placed on Friday and the patient has been vomiting since then, the dentist called in nausea meds with no relief

## 2018-10-08 LAB — COMPREHENSIVE METABOLIC PANEL
ALT: 19 U/L (ref 0–44)
AST: 18 U/L (ref 15–41)
Albumin: 4.2 g/dL (ref 3.5–5.0)
Alkaline Phosphatase: 83 U/L (ref 38–126)
Anion gap: 19 — ABNORMAL HIGH (ref 5–15)
BUN: 22 mg/dL — ABNORMAL HIGH (ref 6–20)
CO2: 16 mmol/L — ABNORMAL LOW (ref 22–32)
Calcium: 9 mg/dL (ref 8.9–10.3)
Chloride: 99 mmol/L (ref 98–111)
Creatinine, Ser: 0.74 mg/dL (ref 0.44–1.00)
GFR calc Af Amer: >60 mL/min (ref >60)
GFR calc non Af Amer: >60 mL/min (ref >60)
Glucose, Bld: 81 mg/dL (ref 70–99)
Potassium: 3.4 mmol/L — ABNORMAL LOW (ref 3.5–5.1)
Sodium: 134 mmol/L — ABNORMAL LOW (ref 135–145)
Total Bilirubin: 1.1 mg/dL (ref 0.3–1.2)
Total Protein: 8.2 g/dL — ABNORMAL HIGH (ref 6.5–8.1)

## 2018-10-08 LAB — CBC WITH DIFFERENTIAL/PLATELET
Abs Immature Granulocytes: 0.02 10*3/uL (ref 0.00–0.07)
Basophils Absolute: 0.1 10*3/uL (ref 0.0–0.1)
Basophils Relative: 1 %
Eosinophils Absolute: 0.1 10*3/uL (ref 0.0–0.5)
Eosinophils Relative: 1 %
HCT: 44.5 % (ref 36.0–46.0)
Hemoglobin: 15.3 g/dL — ABNORMAL HIGH (ref 12.0–15.0)
Immature Granulocytes: 0 %
Lymphocytes Relative: 23 %
Lymphs Abs: 2.1 10*3/uL (ref 0.7–4.0)
MCH: 31.8 pg (ref 26.0–34.0)
MCHC: 34.4 g/dL (ref 30.0–36.0)
MCV: 92.5 fL (ref 80.0–100.0)
Monocytes Absolute: 0.7 10*3/uL (ref 0.1–1.0)
Monocytes Relative: 7 %
Neutro Abs: 6.3 10*3/uL (ref 1.7–7.7)
Neutrophils Relative %: 68 %
Platelets: 295 10*3/uL (ref 150–400)
RBC: 4.81 MIL/uL (ref 3.87–5.11)
RDW: 11.5 % (ref 11.5–15.5)
WBC: 9.3 10*3/uL (ref 4.0–10.5)
nRBC: 0 % (ref 0.0–0.2)

## 2018-10-08 LAB — LIPASE, BLOOD: Lipase: 32 U/L (ref 11–51)

## 2018-10-08 MED ORDER — METOCLOPRAMIDE HCL 10 MG PO TABS: 10.0000 mg | ORAL_TABLET | Freq: Three times a day (TID) | ORAL | 0 refills | Status: DC | PRN

## 2018-10-08 MED ORDER — SODIUM CHLORIDE 0.9 % IV BOLUS
1000.0000 mL | Freq: Once | INTRAVENOUS | Status: AC
  Administered 2018-10-08: 1000 mL via INTRAVENOUS

## 2018-10-08 MED ORDER — METOCLOPRAMIDE HCL 5 MG/ML IJ SOLN
10.0000 mg | Freq: Once | INTRAMUSCULAR | Status: AC
  Administered 2018-10-08: 10 mg via INTRAVENOUS
  Filled 2018-10-08: qty 2

## 2018-10-08 NOTE — ED Provider Notes (Signed)
[facility] DEPT Provider Note  CSN: [id number] Arrival date & time: 10/07/18 2217  Chief Complaint(s) Emesis  HPI [name] is a 48 y.o. female with a history of migraines who presents to the emergency department with nausea and nonbloody nonbilious emesis.  She reports that she had a dental extraction 2 days ago.  Since then she is been having nausea which is been treated with ODT Zofran.  Initially helped but yesterday the Zofran was not helping.  Since then she is developed headache and photophobia.  Denies any fevers or chills.  No nuchal rigidity.  Endorse post emesis abdominal discomfort.  No diarrhea.  No cough or congestion.  No chest pain or shortness of breath.  Patient is endorsing maxillary pain following the surgery.  HPI  Past Medical History Past Medical History:  Diagnosis Date  . Migraine   . Seizure [facility])    Patient Active Problem List   Diagnosis Date Noted  . SOB (shortness of breath) 06/18/2018  . Chest pain 06/18/2018  . Seizures ([place]) 05/31/2018  . Acquired mallet finger of right hand 05/27/2018  . Bilateral hand pain 05/27/2018  . 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 Chronic foot pain 05/15/2018  . Dupuytren's contracture of both hands 05/15/2018  . GERD (gastroesophageal reflux disease) 11/30/2017  . Rash and nonspecific skin eruption 04/20/2017  . GAD (generalized anxiety disorder) 04/20/2017  . 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 Migraine without aura and without status migrainosus, not intractable 06/26/2016  . Seizure ([place]) 12/24/2015   Home Medication(s) Prior to Admission medications   Medication Sig Start Date End Date Taking? Authorizing Provider  acetaminophen (TYLENOL) 500 MG tablet Take 1,500 mg by mouth 3 (three) times daily as needed for headache (pain).   Yes [provider]  escitalopram (LEXAPRO) 20 MG tablet TAKE 1 TABLET BY  MOUTH ONCE DAILY Patient taking differently: Take 20 mg by mouth daily.  07/16/18  Yes Pleas Koch, NP  Fremanezumab-vfrm (AJOVY) 225 MG/1.5ML SOSY Inject 225 mg into the skin every 30 (thirty) days. Patient taking differently: Inject 225 mg into the skin every 30 (thirty) days. Last injection mid September, 2019 12/31/17  Yes Penumalli, Earlean Polka, MD  NONFORMULARY OR COMPOUNDED ITEM Apply 1-2 g topically 4 (four) times daily. [facility]  Scar Cream -  Verapamil 10%, Pentoxifylline 5% Apply 1-2 grams to affected area 3-4 times daily Qty. 120 gm 3 refills Patient taking differently: Apply 1-2 g topically See admin instructions. [facility]  Scar Cream -  Verapamil 10%, Pentoxifylline 5% Apply 1-2 grams to affected area (feet) 3-4 times daily Qty. 120 gm 3 refills 05/31/18  Yes Evelina Bucy, DPM  ondansetron (ZOFRAN ODT) 4 MG disintegrating tablet Take 1 tablet (4 mg total) every 8 (eight) hours as needed by mouth. 07/16/17  Yes Marcial Pacas, MD  PHENobarbital (LUMINAL) 97.2 MG tablet TAKE 2 TABLETS BY MOUTH AT BEDTIME Patient taking differently: Take 194.4 mg by mouth daily after breakfast.  07/01/18  Yes Penumalli, Earlean Polka, MD  rizatriptan (MAXALT-MLT) 10 MG disintegrating tablet Take 1 tablet (10 mg total) by mouth as needed for migraine. May repeat in 2 hours if needed Patient taking differently: Take 10 mg by mouth See admin instructions. Dissolve one tablet (10 mg) under tongue at onset of migraine -may repeat in 2 hours if still needed 12/31/17  Yes Penumalli, Earlean Polka, MD  tiZANidine (ZANAFLEX) 4 MG tablet Take 4 mg  by mouth daily as needed (migraine).  03/19/18  Yes [provider]  benzonatate (TESSALON) 200 MG capsule Take 1 capsule (200 mg total) by mouth 3 (three) times daily as needed for cough. Patient not taking: Reported on 10/08/2018 09/30/18   Pleas Koch, NP  metoCLOPramide (REGLAN) 10 MG tablet Take 1 tablet (10 mg total) by mouth every 8 (eight)  hours as needed for up to 5 days for nausea or vomiting. 10/08/18 10/13/18  Deondray Ospina, Grayce Sessions, MD                                                                                                                                    Past Surgical History Past Surgical History:  Procedure Laterality Date  . ABDOMINAL HYSTERECTOMY  2012   Family History Family History  Problem Relation Age of Onset  . Hypertension Mother     Social History Social History   Tobacco Use  . Smoking status: Former Smoker    Packs/day: 1.00    Types: Cigarettes  . Smokeless tobacco: Never Used  . Tobacco comment: Quit Janurary 1st  Substance Use Topics  . Alcohol use: No    Alcohol/week: 0.0 standard drinks  . Drug use: No   Allergies Asa buff (mag [buffered aspirin]; Azithromycin; Ibuprofen; and Penicillins  Review of Systems Review of Systems All other systems are reviewed and are negative for acute change except as noted in the HPI  Physical Exam Vital Signs  I have reviewed the triage vital signs BP 108/90 (BP Location: Left Arm)   Pulse (!) 112   Temp 97.9 F (36.6 C) (Oral)   Resp 20   Ht 5\' 5"  (1.651 m)   Wt 63.5 kg   SpO2 100%   BMI 23.30 kg/m   Physical Exam Vitals signs reviewed.  Constitutional:      General: She is not in acute distress.    Appearance: She is well-developed. She is ill-appearing. She is not diaphoretic.  HENT:     Head: Normocephalic and atraumatic.     Right Ear: External ear normal.     Left Ear: External ear normal.     Nose:     Right Sinus: No maxillary sinus tenderness.     Left Sinus: Maxillary sinus tenderness present.     Mouth/Throat:     Comments: Complete extraction of upper teeth.  Patient has dried coagulated blood.  No active bleeding.  No swelling or erythema. Eyes:     General: No scleral icterus.    Conjunctiva/sclera: Conjunctivae normal.  Neck:     Musculoskeletal: Normal range of motion.     Trachea: Phonation normal.    Cardiovascular:     Rate and Rhythm: Normal rate and regular rhythm.  Pulmonary:     Effort: Pulmonary effort is normal. No respiratory distress.     Breath sounds: No stridor.  Abdominal:     General: There  is no distension.     Tenderness: There is abdominal tenderness (mild discomfort) in the left upper quadrant. There is no guarding or rebound.  Musculoskeletal: Normal range of motion.  Neurological:     Mental Status: She is alert and oriented to person, place, and time.  Psychiatric:        Behavior: Behavior normal.     ED Results and Treatments Labs (all labs ordered are listed, but only abnormal results are displayed) Labs Reviewed  CBC WITH DIFFERENTIAL/PLATELET - Abnormal; Notable for the following components:      Result Value   Hemoglobin 15.3 (*)    All other components within normal limits  COMPREHENSIVE METABOLIC PANEL - Abnormal; Notable for the following components:   Sodium 134 (*)    Potassium 3.4 (*)    CO2 16 (*)    BUN 22 (*)    Total Protein 8.2 (*)    Anion gap 19 (*)    All other components within normal limits  LIPASE, BLOOD                                                                                                                         EKG  EKG Interpretation  Date/Time:    Ventricular Rate:    PR Interval:    QRS Duration:   QT Interval:    QTC Calculation:   R Axis:     Text Interpretation:        Radiology No results found. Pertinent labs & imaging results that were available during my care of the patient were reviewed by me and considered in my medical decision making (see chart for details).  Medications Ordered in ED Medications  sodium chloride 0.9 % bolus 1,000 mL (1,000 mLs Intravenous New Bag/Given 10/08/18 0141)  metoCLOPramide (REGLAN) injection 10 mg (10 mg Intravenous Given 10/08/18 0141)                                                                                                                                     Procedures Procedures  (including critical care time)  Medical Decision Making / ED Course I have reviewed the nursing notes for this encounter and the patient's prior records (if available in EHR or on provided paperwork).    Patient presents with nausea and nonbloody nonbilious emesis following dental extraction.  She is afebrile with stable vital signs.  Exam  notable for maxillary dental extraction.  Some discomfort to palpation maxillary region, but no obvious evidence of infection at this time.  Labs reassuring without leukocytosis.  Patient does have evidence of hemoconcentration.  Mild hyponatremia and hyperkalemia.  No renal insufficiency.  No evidence of biliary obstruction or pancreatitis.  Treated symptomatically with IV fluids and Reglan.  Significant improvement following treatment.  Patient was able to tolerate oral intake.  Final Clinical Impression(s) / ED Diagnoses Final diagnoses:  Nausea and vomiting in adult    Disposition: Discharge  Condition: Good  I have discussed the results, Dx and Tx plan with the patient who expressed understanding and agree(s) with the plan. Discharge instructions discussed at great length. The patient was given strict return precautions who verbalized understanding of the instructions. No further questions at time of discharge.    ED Discharge Orders         Ordered    metoCLOPramide (REGLAN) 10 MG tablet  Every 8 hours PRN     10/08/18 0547           Follow Up: Pleas Koch, NP [place] [id number] [phone]   As needed  Orthodontist   as scheduled     This chart was dictated using voice recognition software.  Despite best efforts to proofread,  errors can occur which can change the documentation meaning.   Fatima Blank, MD 10/08/18 [phone]

## 2018-10-08 NOTE — ED Notes (Signed)
Pt was ambulatory to bathroom with no assistance

## 2018-10-18 ENCOUNTER — Ambulatory Visit: Payer: BLUE CROSS/BLUE SHIELD | Admitting: Internal Medicine

## 2018-10-20 ENCOUNTER — Other Ambulatory Visit: Payer: Self-pay | Admitting: Primary Care

## 2018-10-20 DIAGNOSIS — F411 Generalized anxiety disorder: Secondary | ICD-10-CM

## 2018-10-23 ENCOUNTER — Other Ambulatory Visit: Payer: Self-pay | Admitting: Diagnostic Neuroimaging

## 2018-11-15 ENCOUNTER — Ambulatory Visit: Payer: BLUE CROSS/BLUE SHIELD | Admitting: Internal Medicine

## 2018-11-18 ENCOUNTER — Ambulatory Visit: Payer: BLUE CROSS/BLUE SHIELD | Admitting: Primary Care

## 2018-11-18 ENCOUNTER — Emergency Department (HOSPITAL_COMMUNITY)
Admission: EM | Admit: 2018-11-18 | Discharge: 2018-11-18 | Disposition: A | Discharge status: Home / Self Care | Payer: BLUE CROSS/BLUE SHIELD | Attending: Emergency Medicine | Admitting: Emergency Medicine

## 2018-11-18 ENCOUNTER — Encounter (HOSPITAL_COMMUNITY): Payer: Self-pay | Admitting: *Deleted

## 2018-11-18 ENCOUNTER — Telehealth: Payer: Self-pay

## 2018-11-18 ENCOUNTER — Other Ambulatory Visit: Payer: Self-pay

## 2018-11-18 DIAGNOSIS — Z79899 Other long term (current) drug therapy: Secondary | ICD-10-CM | POA: Diagnosis not present

## 2018-11-18 DIAGNOSIS — J069 Acute upper respiratory infection, unspecified: Secondary | ICD-10-CM

## 2018-11-18 DIAGNOSIS — R05 Cough: Secondary | ICD-10-CM | POA: Diagnosis present

## 2018-11-18 DIAGNOSIS — R0602 Shortness of breath: Secondary | ICD-10-CM | POA: Diagnosis not present

## 2018-11-18 DIAGNOSIS — Z87891 Personal history of nicotine dependence: Secondary | ICD-10-CM | POA: Diagnosis not present

## 2018-11-18 DIAGNOSIS — R059 Cough, unspecified: Secondary | ICD-10-CM

## 2018-11-18 DIAGNOSIS — R0981 Nasal congestion: Secondary | ICD-10-CM | POA: Diagnosis not present

## 2018-11-18 DIAGNOSIS — B9789 Other viral agents as the cause of diseases classified elsewhere: Secondary | ICD-10-CM

## 2018-11-18 MED ORDER — FLUTICASONE PROPIONATE 50 MCG/ACT NA SUSP: 1.0000 | Freq: Every day | NASAL | 0 refills | Status: DC

## 2018-11-18 MED ORDER — ALBUTEROL SULFATE HFA 108 (90 BASE) MCG/ACT IN AERS: 1.0000 | INHALATION_SPRAY | Freq: Four times a day (QID) | RESPIRATORY_TRACT | 0 refills | Status: DC | PRN

## 2018-11-18 MED ORDER — BENZONATATE 200 MG PO CAPS: 200.0000 mg | ORAL_CAPSULE | Freq: Three times a day (TID) | ORAL | 0 refills | Status: DC | PRN

## 2018-11-18 NOTE — ED Provider Notes (Signed)
[facility] EMERGENCY DEPARTMENT Provider Note   CSN: [id number] Arrival date & time: 11/18/18  1000    History   Chief Complaint Chief Complaint  Patient presents with  . Shortness of Breath    HPI [name] is a 48 y.o. female presenting for evaluation of cough, fever, nasal congestion, and shortness of breath.  Patient states her symptoms began 5 days ago.  She reports a nonproductive cough.  She has had fevers up to 101, improving as today's temperature was 99.  She has associated nasal congestion and sinus pressure.  She reports mild shortness of breath, mostly while coughing.  She reports a sore throat while coughing.  She denies ear pain, chest pain, nausea, vomiting, abdominal pain, urinary symptoms, normal bowel movements.  She denies recent travel.  She denies sick contacts.  She denies contact with known COVID-19 pt. She works at [employer]. She has a h/o migraines and sz, for which she takes medication. No other medical problems. Pt has been taking nyquil, tylenol and ibuprofen for sxs. She has a h/o asthma as a child, does nut use an inhaler. She does not smoke cigarettes.   HPI  Past Medical History:  Diagnosis Date  . Migraine   . Seizure [facility])     Patient Active Problem List   Diagnosis Date Noted  . SOB (shortness of breath) 06/18/2018  . Chest pain 06/18/2018  . Seizures ([place]) 05/31/2018  . Acquired mallet finger of right hand 05/27/2018  . Bilateral hand pain 05/27/2018  . 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 Chronic foot pain 05/15/2018  . Dupuytren's contracture of both hands 05/15/2018  . GERD (gastroesophageal reflux disease) 11/30/2017  . Rash and nonspecific skin eruption 04/20/2017  . GAD (generalized anxiety disorder) 04/20/2017  . 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 Migraine without aura and without status migrainosus, not intractable 06/26/2016  . Seizure ([place])  12/24/2015    Past Surgical History:  Procedure Laterality Date  . ABDOMINAL HYSTERECTOMY  2012     OB History   No obstetric history on file.      Home Medications    Prior to Admission medications   Medication Sig Start Date End Date Taking? Authorizing Provider  acetaminophen (TYLENOL) 500 MG tablet Take 1,500 mg by mouth 3 (three) times daily as needed for headache (pain).    [provider]  albuterol (PROVENTIL HFA;VENTOLIN HFA) 108 (90 Base) MCG/ACT inhaler Inhale 1-2 puffs into the lungs every 6 (six) hours as needed for wheezing or shortness of breath. 11/18/18   Hedges, Dellis Filbert, PA-C  benzonatate (TESSALON) 200 MG capsule Take 1 capsule (200 mg total) by mouth 3 (three) times daily as needed for cough. 11/18/18   Janell Keeling, PA-C  escitalopram (LEXAPRO) 20 MG tablet TAKE 1 TABLET BY MOUTH ONCE DAILY 10/21/18   Pleas Koch, NP  fluticasone [facility]) 50 MCG/ACT nasal spray Place 1 spray into both nostrils daily. 11/18/18   Shanoah Asbill, PA-C  Fremanezumab-vfrm (AJOVY) 225 MG/1.5ML SOSY Inject 225 mg into the skin every 30 (thirty) days. Patient taking differently: Inject 225 mg into the skin every 30 (thirty) days. Last injection mid September, 2019 12/31/17   Penumalli, Earlean Polka, MD  metoCLOPramide (REGLAN) 10 MG tablet Take 1 tablet (10 mg total) by mouth every 8 (eight) hours as needed for up to 5 days for nausea or vomiting. 10/08/18 10/13/18  [place], Grayce Sessions, MD  NONFORMULARY OR COMPOUNDED ITEM  Apply 1-2 g topically 4 (four) times daily. [facility]  Scar Cream -  Verapamil 10%, Pentoxifylline 5% Apply 1-2 grams to affected area 3-4 times daily Qty. 120 gm 3 refills Patient taking differently: Apply 1-2 g topically See admin instructions. [facility]  Scar Cream -  Verapamil 10%, Pentoxifylline 5% Apply 1-2 grams to affected area (feet) 3-4 times daily Qty. 120 gm 3 refills 05/31/18   Evelina Bucy, DPM  ondansetron (ZOFRAN  ODT) 4 MG disintegrating tablet Take 1 tablet (4 mg total) every 8 (eight) hours as needed by mouth. 07/16/17   Marcial Pacas, MD  PHENobarbital (LUMINAL) 97.2 MG tablet TAKE 2 TABLETS BY MOUTH AT BEDTIME Patient taking differently: Take 194.4 mg by mouth daily after breakfast.  07/01/18   Penumalli, Earlean Polka, MD  rizatriptan (MAXALT-MLT) 10 MG disintegrating tablet Take 1 tablet (10 mg total) by mouth as needed for migraine. May repeat in 2 hours if needed Patient taking differently: Take 10 mg by mouth See admin instructions. Dissolve one tablet (10 mg) under tongue at onset of migraine -may repeat in 2 hours if still needed 12/31/17   Penumalli, Earlean Polka, MD  tiZANidine (ZANAFLEX) 4 MG tablet Take 4 mg by mouth daily as needed (migraine).  03/19/18   [provider]    Family History Family History  Problem Relation Age of Onset  . Hypertension Mother     Social History Social History   Tobacco Use  . Smoking status: Former Smoker    Packs/day: 1.00    Types: Cigarettes  . Smokeless tobacco: Never Used  . Tobacco comment: Quit Janurary 1st  Substance Use Topics  . Alcohol use: No    Alcohol/week: 0.0 standard drinks  . Drug use: No     Allergies   Asa buff (mag [buffered aspirin]; Azithromycin; Ibuprofen; and Penicillins   Review of Systems Review of Systems  Constitutional: Positive for fever.  HENT: Positive for congestion and sore throat.   Respiratory: Positive for cough and shortness of breath.      Physical Exam Updated Vital Signs BP 109/81 (BP Location: Right Arm)   Pulse 77   Temp (!) 97.5 F (36.4 C) (Oral)   Resp 15   Ht 5\' 5"  (1.651 m)   Wt 68 kg   SpO2 97%   BMI 24.96 kg/m   Physical Exam Vitals signs and nursing note reviewed.  Constitutional:      General: She is not in acute distress.    Appearance: She is well-developed.     Comments: Sitting comfortably in the bed in no acute distress  HENT:     Head: Normocephalic and atraumatic.       Comments: OP mildly erythematous without tonsillar swelling or exudate.  Uvula midline with equal palate rise.  TMs nonerythematous nonbulging bilaterally.  Nasal congestion and mucosal edema    Right Ear: Tympanic membrane, ear canal and external ear normal.     Left Ear: Tympanic membrane, ear canal and external ear normal.     Nose: Mucosal edema present.     Right Sinus: No maxillary sinus tenderness or frontal sinus tenderness.     Left Sinus: No maxillary sinus tenderness or frontal sinus tenderness.     Mouth/Throat:     Pharynx: Uvula midline.     Tonsils: No tonsillar exudate.  Eyes:     Conjunctiva/sclera: Conjunctivae normal.     Pupils: Pupils are equal, round, and reactive to light.  Neck:  Musculoskeletal: Normal range of motion.  Cardiovascular:     Rate and Rhythm: Normal rate and regular rhythm.     Pulses: Normal pulses.  Pulmonary:     Effort: Pulmonary effort is normal.     Breath sounds: Normal breath sounds. No decreased breath sounds, wheezing, rhonchi or rales.     Comments: Speaking in full sentences.  Clear lung sounds in all fields.  No respiratory distress.  Dry cough noted on exam Abdominal:     General: There is no distension.     Palpations: Abdomen is soft. There is no mass.     Tenderness: There is no abdominal tenderness. There is no guarding or rebound.  Musculoskeletal: Normal range of motion.  Lymphadenopathy:     Cervical: No cervical adenopathy.  Skin:    General: Skin is warm.     Capillary Refill: Capillary refill takes less than 2 seconds.  Neurological:     Mental Status: She is alert and oriented to person, place, and time.      ED Treatments / Results  Labs (all labs ordered are listed, but only abnormal results are displayed) Labs Reviewed - No data to display  EKG None  Radiology No results found.  Procedures Procedures (including critical care time)  Medications Ordered in ED Medications - No data to  display   Initial Impression / Assessment and Plan / ED Course  I have reviewed the triage vital signs and the nursing notes.  Pertinent labs & imaging results that were available during my care of the patient were reviewed by me and considered in my medical decision making (see chart for details).        Patient 5-day history of cough, congestion, sore throat, shortness of breath, and fever.  Physical exam reassuring, patient is afebrile and appears nontoxic.  She is not considered high risk for coded, as she has not traveled or had exposure.  However, she is interacting with her community frequently, and thus is a possibility that she has coded.  However, does not meet CDC guidelines for testing.  Also consider flu versus other viral illness.  Discussed with patient.  Discussed that as we are not testing, she should quarantine until symptoms improve.  Strict return precautions given, including signs of worsening respiratory status. At this time, pt appears safe for d/c. Pt states she understands and agrees to plan.   Final Clinical Impressions(s) / ED Diagnoses   Final diagnoses:  Cough  Shortness of breath  Nasal congestion    ED Discharge Orders         Ordered    benzonatate (TESSALON) 200 MG capsule  3 times daily PRN     11/18/18 1049    fluticasone (FLONASE) 50 MCG/ACT nasal spray  Daily     11/18/18 1049    albuterol (PROVENTIL HFA;VENTOLIN HFA) 108 (90 Base) MCG/ACT inhaler  Every 6 hours PRN,   Status:  Discontinued     11/18/18 1239    albuterol (PROVENTIL HFA;VENTOLIN HFA) 108 (90 Base) MCG/ACT inhaler  Every 6 hours PRN     11/18/18 [place], Shenell Rogalski, PA-C 11/18/18 1312    Noemi Chapel, MD 11/19/18 [phone]

## 2018-11-18 NOTE — ED Triage Notes (Signed)
PT reports SHOB , fever and Dry cough  For 5 days. Pt denies any other sick contacts . Pt works at [employer]. Pt reports Asthma as child. Pt has no known contacts with postive  COVID-19

## 2018-11-18 NOTE — Telephone Encounter (Signed)
Pt has been sick for couple or so days with non prod cough,fever 101.7,pt is having difficulty breathing to the point she is having problems speaking. Pt has not traveled and has not been exposed to + corona virus or flu. Pt is going to [facility] now.FYI to Allie Bossier NP.

## 2018-11-18 NOTE — Discharge Instructions (Addendum)
You should self quarantine for a minimum until Thursday.  You may discontinue quarantine if you are fever free for 72 hours and your symptoms are improving.  If not, continue quarantine until these criteria are met. Continue to treat your cough, nasal congestion, and pain symptomatically.  If you live with, or provide care at home for, a person confirmed to have, or being evaluated for, COVID-19 infection please follow these guidelines to prevent infection:  Follow healthcare provider's instructions Make sure that you understand and can help the patient follow any healthcare provider instructions for all care.  Provide for the patient's basic needs You should help the patient with basic needs in the home and provide support for getting groceries, prescriptions, and other personal needs.  Monitor the patient's symptoms If they are getting sicker, call his or her medical provider a  This will help the healthcare provider's office take steps to keep other people from getting infected. Ask the healthcare provider to call the local or state health department.  Limit the number of people who have contact with the patient If possible, have only one caregiver for the patient. Other household members should stay in another home or place of residence. If this is not possible, they should stay in another room, or be separated from the patient as much as possible. Use a separate bathroom, if available. Restrict visitors who do not have an essential need to be in the home.  Keep older adults, very young children, and other sick people away from the patient Keep older adults, very young children, and those who have compromised immune systems or chronic health conditions away from the patient. This includes people with chronic heart, lung, or kidney conditions, diabetes, and cancer.  Ensure good ventilation Make sure that shared spaces in the home have good air flow, such as from an air conditioner or  an opened window, weather permitting.  Wash your hands often Wash your hands often and thoroughly with soap and water for at least 20 seconds. You can use an alcohol based hand sanitizer if soap and water are not available and if your hands are not visibly dirty. Avoid touching your eyes, nose, and mouth with unwashed hands. Use disposable paper towels to dry your hands. If not available, use dedicated cloth towels and replace them when they become wet.  Wear a facemask and gloves Wear a disposable facemask at all times in the room and gloves when you touch or have contact with the patient's blood, body fluids, and/or secretions or excretions, such as sweat, saliva, sputum, nasal mucus, vomit, urine, or feces.  Ensure the mask fits over your nose and mouth tightly, and do not touch it during use. Throw out disposable facemasks and gloves after using them. Do not reuse. Wash your hands immediately after removing your facemask and gloves. If your personal clothing becomes contaminated, carefully remove clothing and launder. Wash your hands after handling contaminated clothing. Place all used disposable facemasks, gloves, and other waste in a lined container before disposing them with other household waste. Remove gloves and wash your hands immediately after handling these items.  Do not share dishes, glasses, or other household items with the patient Avoid sharing household items. You should not share dishes, drinking glasses, cups, eating utensils, towels, bedding, or other items After the person uses these items, you should wash them thoroughly with soap and water.  Wash laundry thoroughly Immediately remove and wash clothes or bedding that have blood, body fluids, and/or secretions  or excretions, such as sweat, saliva, sputum, nasal mucus, vomit, urine, or feces, on them. Wear gloves when handling laundry from the patient. Read and follow directions on labels of laundry or clothing items and  detergent. In general, wash and dry with the warmest temperatures recommended on the label.  Clean all areas the individual has used often Clean all touchable surfaces, such as counters, tabletops, doorknobs, bathroom fixtures, toilets, phones, keyboards, tablets, and bedside tables, every day. Also, clean any surfaces that may have blood, body fluids, and/or secretions or excretions on them. Wear gloves when cleaning surfaces the patient has come in contact with. Use a diluted bleach solution (e.g., dilute bleach with 1 part bleach and 10 parts water) or a household disinfectant with a label that says EPA-registered for coronaviruses. To make a bleach solution at home, add 1 tablespoon of bleach to 1 quart (4 cups) of water. For a larger supply, add  cup of bleach to 1 gallon (16 cups) of water. Read labels of cleaning products and follow recommendations provided on product labels. Labels contain instructions for safe and effective use of the cleaning product including precautions you should take when applying the product, such as wearing gloves or eye protection and making sure you have good ventilation during use of the product. Remove gloves and wash hands immediately after cleaning.  Monitor yourself for signs and symptoms of illness Caregivers and household members are considered close contacts, should monitor their health, and will be asked to limit movement outside of the home to the extent possible. Follow the monitoring steps for close contacts listed on the symptom monitoring form.   ? If you have additional questions, contact your local health department or call the epidemiologist on call at [phone] (available 24/7). ? This guidance is subject to change. For the most up-to-date guidance from CDC, please refer to their website: https://www.cdc.gov/coronavirus/2019-ncov/hcp/guidance-prevent-spread.html   

## 2018-11-18 NOTE — ED Notes (Signed)
Patient verbalizes understanding of discharge instructions . Opportunity for questions and answers were provided . Armband removed by staff ,Pt discharged from ED. W/C  offered at D/C  and Declined W/C at D/C and was escorted to lobby by RN.  

## 2018-11-18 NOTE — Telephone Encounter (Signed)
Noted, chart reviewed. Patient to go under quarantine.

## 2018-11-25 ENCOUNTER — Telehealth: Payer: Self-pay | Admitting: *Deleted

## 2018-11-25 NOTE — Telephone Encounter (Addendum)
   Primary Cardiologist:  Elouise Munroe, MD   Patient contacted.  History reviewed.  No symptoms to suggest any unstable cardiac conditions.  Based on discussion, with current pandemic situation, we will be postponing this appointment for [facility] with a plan for f/u in >12 wks or sooner if feasible/necessary.  If symptoms change, she has been instructed to contact our office.   Routing to C19 CANCEL pool for tracking   Ines Bloomer  11/25/2018 12:18 PM    Will needed echo before  appointment     .

## 2018-11-29 ENCOUNTER — Ambulatory Visit: Payer: BLUE CROSS/BLUE SHIELD | Admitting: Internal Medicine

## 2018-12-02 ENCOUNTER — Telehealth: Payer: Self-pay | Admitting: Primary Care

## 2018-12-02 NOTE — Telephone Encounter (Signed)
Best number  [phone] Pt called stating she went to er 3/27  To be treated for covid19   Pt was not test  She needs work note stating she can to back to work

## 2018-12-02 NOTE — Telephone Encounter (Signed)
Please notify patient that she may return to work if she has been fever free for three days without the use of Tylenol or Ibuprofen and if her symptoms have improved. Let me know if this is the case and I'll write her a note.

## 2018-12-03 ENCOUNTER — Encounter: Payer: Self-pay | Admitting: Primary Care

## 2018-12-03 NOTE — Telephone Encounter (Signed)
Noted, letter completed and placed in Chan's inbox.

## 2018-12-03 NOTE — Telephone Encounter (Signed)
Spoken and notified patient of [name] comments. Patient stated that she has been fever free for a week and need to go back to work. Patient stated that if she can get a work note so she can go to work today.

## 2018-12-04 ENCOUNTER — Telehealth: Payer: Self-pay | Admitting: *Deleted

## 2018-12-04 NOTE — Telephone Encounter (Signed)
Attempted Ajovy PA on CMM, key: [id number]. CMM stated patient is inactive. Called patient, LVM requesting she call back to discuss.

## 2018-12-10 NOTE — Telephone Encounter (Signed)
LVM informing patient that I was trying to do PA on Ajovy. However pharmacy stated her insurance must have changed; I am unable to complete PA. I advised that hopefully she is still getting Ajovy. Left number for call back.

## 2018-12-17 ENCOUNTER — Telehealth: Payer: Self-pay | Admitting: *Deleted

## 2018-12-17 NOTE — Telephone Encounter (Signed)
LVM informing patient that due to current COVID 19 pandemic, our office is severely reducing in person visits in order to minimize the risk to our patients and healthcare providers. We recommend to convert your appointment to a video visit.  Advised her FU can be moved sooner. Requested she call back to discuss.

## 2018-12-23 NOTE — Addendum Note (Signed)
Addended by: Florian Buff C on: 12/23/2018 11:50 AM   Modules accepted: Orders

## 2018-12-23 NOTE — Telephone Encounter (Signed)
Spoke with patient and advised her Due to current COVID 19 pandemic, our office is severely reducing in person visits in order to minimize the risk to our patients and healthcare providers. We recommend to convert your appointment to a video visit. We'll take all precautions to reduce any security or privacy concerns. This will be treated like an office visit, and we will file with your insurance. She consented to video visit. Pt's email is [email or web address]. Pt understands that the [employer] webex software must be downloaded and operational on the device pt plans to use for the visit. Updated EMR; she verbalized understanding, appreciation. Webex scheduled. E mail sent.

## 2018-12-25 ENCOUNTER — Encounter: Payer: Self-pay | Admitting: Diagnostic Neuroimaging

## 2018-12-25 ENCOUNTER — Other Ambulatory Visit: Payer: Self-pay

## 2018-12-25 ENCOUNTER — Ambulatory Visit (INDEPENDENT_AMBULATORY_CARE_PROVIDER_SITE_OTHER): Payer: BLUE CROSS/BLUE SHIELD | Admitting: Diagnostic Neuroimaging

## 2018-12-25 DIAGNOSIS — G43009 Migraine without aura, not intractable, without status migrainosus: Secondary | ICD-10-CM | POA: Diagnosis not present

## 2018-12-25 DIAGNOSIS — G40B09 Juvenile myoclonic epilepsy, not intractable, without status epilepticus: Secondary | ICD-10-CM

## 2018-12-25 MED ORDER — FREMANEZUMAB-VFRM 225 MG/1.5ML ~~LOC~~ SOSY: 225.0000 mg | PREFILLED_SYRINGE | SUBCUTANEOUS | 4 refills | Status: DC

## 2018-12-25 MED ORDER — PHENOBARBITAL 97.2 MG PO TABS: 194.4000 mg | ORAL_TABLET | Freq: Every day | ORAL | 5 refills | Status: DC

## 2018-12-25 MED ORDER — RIZATRIPTAN BENZOATE 10 MG PO TBDP: 10.0000 mg | ORAL_TABLET | ORAL | 11 refills | Status: DC | PRN

## 2018-12-25 NOTE — Progress Notes (Signed)
    Virtual Visit via Video Note  I connected with [name] on 12/25/18 at  4:30 PM EDT by a video enabled telemedicine application and verified that I am speaking with the correct person using two identifiers.   I discussed the limitations of evaluation and management by telemedicine and the availability of in person appointments. The patient expressed understanding and agreed to proceed.  Patient is at their home. I am at the office.    History of Present Illness:  - doing well; no seizures - HA are improving on ajovy - unfortunately patient's husband deceased now (from cancer) - overall stable    Observations/Objective:  - awake and alert, face symm, no dysarthria - no tremor   Assessment and Plan:  48 y.o. female here with seizure d/o (JME) since childhood, but no seizure since 51.  Tolerating PB and wants to stay on it.  Also with chronic daily headache (tension) related to excess caffeine, poor nutrition, increased stress and poor sleep pattern.   Dx:   PLAN:  HEADACHES / MIGRAINES (mixed migraine and tension; daily) - continue ajovy --> (tried and failed topiramate, rizatriptan, amitriptyline) - continue to optimize nutrition, sleep hygeine, caffeine reduction and daily exercise  JME/SEIZURES - continue phenobarbital 97.2mg  (2 tabs daily); could not tolerate reduced dose  STRESS/ANXIETY - continue lexapro  Meds ordered this encounter  Medications  . rizatriptan (MAXALT-MLT) 10 MG disintegrating tablet    Sig: Take 1 tablet (10 mg total) by mouth as needed for migraine. May repeat in 2 hours if needed    Dispense:  9 tablet    Refill:  11  . PHENobarbital (LUMINAL) 97.2 MG tablet    Sig: Take 2 tablets (194.4 mg total) by mouth at bedtime.    Dispense:  60 tablet    Refill:  5  . Fremanezumab-vfrm (AJOVY) 225 MG/1.5ML SOSY    Sig: Inject 225 mg into the skin every 30 (thirty) days.    Dispense:  3 Syringe    Refill:  4     Follow Up  Instructions:  - Return in about 1 year (around 12/25/2019).    I discussed the assessment and treatment plan with the patient. The patient was provided an opportunity to ask questions and all were answered. The patient agreed with the plan and demonstrated an understanding of the instructions.   The patient was advised to call back or seek an in-person evaluation if the symptoms worsen or if the condition fails to improve as anticipated.  I provided 15 minutes of non-face-to-face time during this encounter.   Sandy Bombard, MD 02/22/3150, 7:61 PM Certified in Neurology, Neurophysiology and Neuroimaging  [facility] [address] [place], [place] [address] [phone]

## 2018-12-26 ENCOUNTER — Other Ambulatory Visit: Payer: Self-pay | Admitting: Diagnostic Neuroimaging

## 2018-12-26 ENCOUNTER — Telehealth: Payer: Self-pay | Admitting: Diagnostic Neuroimaging

## 2018-12-26 NOTE — Telephone Encounter (Signed)
Dr Leta Baptist prescribed but was printed vs escribe.  I called and spoke to EVIE at [facility] and gave verbal order.  Phenobarbital 97.2 tabs (take 2 tabs at qhs) #60 with 5 refills.

## 2018-12-26 NOTE — Telephone Encounter (Signed)
Pt called in and states that the wrong medication was called in for her. She is needing PHENobarbital (LUMINAL) 97.2 MG tablet called in to [facility] [address]

## 2019-01-06 ENCOUNTER — Ambulatory Visit: Payer: BLUE CROSS/BLUE SHIELD | Admitting: Diagnostic Neuroimaging

## 2019-02-08 ENCOUNTER — Encounter: Payer: Self-pay | Admitting: Podiatry

## 2019-02-19 ENCOUNTER — Telehealth: Payer: Self-pay | Admitting: *Deleted

## 2019-02-19 NOTE — Telephone Encounter (Signed)
Noted  

## 2019-02-19 NOTE — Telephone Encounter (Signed)
Patient called requesting an appointment and she was transferred to triage. Patient started that she has had two episodes of vomiting, diarrhea and nausea. Patient stated the first time this happened was last Tuesday and then again Monday. Patient stated that she has been chewing tums and taking Prilosec which does not seem to help. Patient stated that she thinks that she may have a hernia. Patient stated that she is not having symptoms at this time, but not know when they will occur.  Patient scheduled for a virtual visit Friday 02/21/19. Advised patient if her symptoms come back or she gets worse before her appointment she needs to go to the ER and she verbalized understanding.

## 2019-02-21 ENCOUNTER — Encounter: Payer: Self-pay | Admitting: Primary Care

## 2019-02-21 ENCOUNTER — Ambulatory Visit (INDEPENDENT_AMBULATORY_CARE_PROVIDER_SITE_OTHER): Payer: BC Managed Care – PPO | Admitting: Primary Care

## 2019-02-21 DIAGNOSIS — K219 Gastro-esophageal reflux disease without esophagitis: Secondary | ICD-10-CM | POA: Diagnosis not present

## 2019-02-21 NOTE — Patient Instructions (Signed)
  Start omeprazole 20 mg once daily for heartburn. Take this for at least 4 weeks.  Please call me if you develop continued vomiting, fevers, diarrhea, increased pain.   It was a pleasure to see you today!

## 2019-02-21 NOTE — Progress Notes (Signed)
Subjective:    Patient ID: [name], female    DOB: 11/21/1970, 48 y.o.   MRN: [id number]  HPI  Virtual Visit via Video Note  I connected with [name] on 02/21/19 at  3:20 PM EDT by a video enabled telemedicine application and verified that I am speaking with the correct person using two identifiers.  Location: Patient: Home Provider: Office   I discussed the limitations of evaluation and management by telemedicine and the availability of in person appointments. The patient expressed understanding and agreed to proceed.  History of Present Illness:  Ms. Shawley is a 48 year old female with a history of GERD, migraines, seizure disorder, GAD who presents today with a chief complaint of epigastric discomfort.  About 10 days ago she noticed a "knot" in her stomach to the epigastric region with nausea, diarrhea, and one episode of vomiting. Her symptoms 10 days ago lasted intermittently throughout the day until she came home and fell asleep. Four days ago she developed the same pain and nausea without diarrhea and vomiting. Today she is feeling better.  She's been taking Tums "like candy" without improvement. She denies fevers, changes in diet, bloody stools, decrease in appetite. Her pain does not radiate and is not worse with eating/drinking. She will notice esophageal burning with eating at times. She does have omeprazole and home and doesn't take it often.   Observations/Objective:  Alert and oriented. Appears well, not sickly. No distress. Speaking in complete sentences.   Assessment and Plan:  Symptoms suspicious for GERD. Less likely cholecystitis.  She appears well today. Will have her start her omeprazole 20 mg daily for at least 4 weeks.  She will update in one week if no improvement. If no improvement then consider lab work up including Lipase, CBC, CMP. Return precautions provided.  Follow Up Instructions:  Start omeprazole 20 mg once daily for  heartburn. Take this for at least 4 weeks.  Please call me if you develop continued vomiting, fevers, diarrhea, increased pain.   It was a pleasure to see you today!    I discussed the assessment and treatment plan with the patient. The patient was provided an opportunity to ask questions and all were answered. The patient agreed with the plan and demonstrated an understanding of the instructions.   The patient was advised to call back or seek an in-person evaluation if the symptoms worsen or if the condition fails to improve as anticipated.     Pleas Koch, NP    Review of Systems  Constitutional: Negative for fever.  Gastrointestinal: Positive for abdominal pain and nausea. Negative for diarrhea and vomiting.       Past Medical History:  Diagnosis Date  . Migraine   . Seizure [facility])      Social History   Socioeconomic History  . Marital status: Widowed    Spouse name: [name]  . Number of children: 0  . Years of education: 10th  . Highest education level: Not on file  Occupational History    Employer: [employer]  . Financial resource strain: Not on file  . Food insecurity    Worry: Not on file    Inability: Not on file  . Transportation needs    Medical: Not on file    Non-medical: Not on file  Tobacco Use  . Smoking status: Former Smoker    Packs/day: 1.00    Types: Cigarettes  . Smokeless tobacco: Never Used  .  Tobacco comment: Quit Janurary 1st  Substance and Sexual Activity  . Alcohol use: No    Alcohol/week: 0.0 standard drinks  . Drug use: No  . Sexual activity: Not on file  Lifestyle  . Physical activity    Days per week: Not on file    Minutes per session: Not on file  . Stress: Not on file  Relationships  . Social Herbalist on phone: Not on file    Gets together: Not on file    Attends religious service: Not on file    Active member of club or organization: Not on file    Attends meetings of clubs or  organizations: Not on file    Relationship status: Not on file  . Intimate partner violence    Fear of current or ex partner: Not on file    Emotionally abused: Not on file    Physically abused: Not on file    Forced sexual activity: Not on file  Other Topics Concern  . Not on file  Social History Narrative   Patient lives at home with spouse.   No children.   Works at [employer].   Enjoys going to [employer], going to estate sells.   Right handed   Caffeine: 1 Dr. [name] every morning    Past Surgical History:  Procedure Laterality Date  . ABDOMINAL HYSTERECTOMY  2012    Family History  Problem Relation Age of Onset  . Hypertension Mother     Allergies  Allergen Reactions  . Asa Buff (Mag [Buffered Aspirin] Other (See Comments)    Stomach pain  . Azithromycin Other (See Comments)    GI problems  . Ibuprofen Other (See Comments)    GI upset  . Penicillins Other (See Comments)    Unknown childhood allergic reaction Has patient had a PCN reaction causing immediate rash, facial/tongue/throat swelling, SOB or lightheadedness with hypotension: Unknown Has patient had a PCN reaction causing severe rash involving mucus membranes or skin necrosis: Unknown Has patient had a PCN reaction that required hospitalization: Unknown Has patient had a PCN reaction occurring within the last 10 years: No If all of the above answers are "NO", then may proceed with Cephalosporin use.    Current Outpatient Medications on File Prior to Visit  Medication Sig Dispense Refill  . acetaminophen (TYLENOL) 500 MG tablet Take 1,500 mg by mouth 3 (three) times daily as needed for headache (pain).    Marland Kitchen albuterol (PROVENTIL HFA;VENTOLIN HFA) 108 (90 Base) MCG/ACT inhaler Inhale 1-2 puffs into the lungs every 6 (six) hours as needed for wheezing or shortness of breath. 1 Inhaler 0  . escitalopram (LEXAPRO) 20 MG tablet TAKE 1 TABLET BY MOUTH ONCE DAILY 90 tablet 1  . fluticasone (FLONASE) 50 MCG/ACT nasal  spray Place 1 spray into both nostrils daily. 16 g 0  . Fremanezumab-vfrm (AJOVY) 225 MG/1.5ML SOSY Inject 225 mg into the skin every 30 (thirty) days. 3 Syringe 4  . metoCLOPramide (REGLAN) 10 MG tablet Take 1 tablet (10 mg total) by mouth every 8 (eight) hours as needed for up to 5 days for nausea or vomiting. 15 tablet 0  . NONFORMULARY OR COMPOUNDED ITEM Apply 1-2 g topically 4 (four) times daily. [facility]  Scar Cream -  Verapamil 10%, Pentoxifylline 5% Apply 1-2 grams to affected area 3-4 times daily Qty. 120 gm 3 refills (Patient taking differently: Apply 1-2 g topically See admin instructions. [facility]  Scar Cream -  Verapamil  10%, Pentoxifylline 5% Apply 1-2 grams to affected area (feet) 3-4 times daily Qty. 120 gm 3 refills) 120 each 2  . PHENobarbital (LUMINAL) 97.2 MG tablet Take 2 tablets (194.4 mg total) by mouth at bedtime. 60 tablet 5  . rizatriptan (MAXALT-MLT) 10 MG disintegrating tablet Take 1 tablet (10 mg total) by mouth as needed for migraine. May repeat in 2 hours if needed 9 tablet 11  . tiZANidine (ZANAFLEX) 4 MG tablet Take 4 mg by mouth daily as needed (migraine).      No current facility-administered medications on file prior to visit.     There were no vitals taken for this visit.   Objective:   Physical Exam  Constitutional: She is oriented to person, place, and time. She appears well-nourished.  Respiratory: Effort normal. No respiratory distress.  Neurological: She is alert and oriented to person, place, and time.  Psychiatric: She has a normal mood and affect.           Assessment & Plan:

## 2019-02-21 NOTE — Assessment & Plan Note (Signed)
Symptoms suspicious for GERD. Less likely cholecystitis.  She appears well today. Will have her start her omeprazole 20 mg daily for at least 4 weeks.  She will update in one week if no improvement. If no improvement then consider lab work up including Lipase, CBC, CMP. Return precautions provided.

## 2019-03-06 ENCOUNTER — Telehealth: Payer: Self-pay | Admitting: Primary Care

## 2019-03-06 DIAGNOSIS — R1013 Epigastric pain: Secondary | ICD-10-CM

## 2019-03-06 NOTE — Telephone Encounter (Signed)
Please notify patient that I would like for her to get some lab work done given her continued symptoms.  Please set her up.  Has she noticed any improvement with the omeprazole 20 mg capsules?

## 2019-03-06 NOTE — Telephone Encounter (Signed)
Noted, await labs results.

## 2019-03-06 NOTE — Telephone Encounter (Signed)
Spoke with patient and she wants to be referred to GI doctor. She is in a lot of pain. I told her that you might be in contact before you all would send a referral I was unsure since I am not clinical. She did have a virtual appt with Anda Kraft on 02/21/19.

## 2019-03-06 NOTE — Telephone Encounter (Signed)
Also patient stated that it seem to be improve last week. However, this week the pain came and worse.

## 2019-03-06 NOTE — Telephone Encounter (Signed)
Please advise 

## 2019-03-06 NOTE — Telephone Encounter (Signed)
Spoken and notified patient of [name] comments. Patient has been schedule for lab appointment on 03/07/2019

## 2019-03-07 ENCOUNTER — Other Ambulatory Visit (INDEPENDENT_AMBULATORY_CARE_PROVIDER_SITE_OTHER): Payer: BC Managed Care – PPO

## 2019-03-07 ENCOUNTER — Other Ambulatory Visit: Payer: Self-pay

## 2019-03-07 ENCOUNTER — Other Ambulatory Visit: Payer: Self-pay | Admitting: Primary Care

## 2019-03-07 DIAGNOSIS — R1013 Epigastric pain: Secondary | ICD-10-CM

## 2019-03-07 DIAGNOSIS — R1010 Upper abdominal pain, unspecified: Secondary | ICD-10-CM

## 2019-03-07 LAB — COMPREHENSIVE METABOLIC PANEL
ALT: 12 U/L (ref 0–35)
AST: 11 U/L (ref 0–37)
Albumin: 4.2 g/dL (ref 3.5–5.2)
Alkaline Phosphatase: 72 U/L (ref 39–117)
BUN: 8 mg/dL (ref 6–23)
CO2: 27 mEq/L (ref 19–32)
Calcium: 8.7 mg/dL (ref 8.4–10.5)
Chloride: 106 mEq/L (ref 96–112)
Creatinine, Ser: 0.77 mg/dL (ref 0.40–1.20)
GFR: 79.45 mL/min (ref >60.00)
Glucose, Bld: 82 mg/dL (ref 70–99)
Potassium: 3.9 mEq/L (ref 3.5–5.1)
Sodium: 140 mEq/L (ref 135–145)
Total Bilirubin: 0.4 mg/dL (ref 0.2–1.2)
Total Protein: 6.8 g/dL (ref 6.0–8.3)

## 2019-03-07 LAB — CBC WITH DIFFERENTIAL/PLATELET
Basophils Absolute: 0 10*3/uL (ref 0.0–0.1)
Basophils Relative: 0.7 % (ref 0.0–3.0)
Eosinophils Absolute: 0.1 10*3/uL (ref 0.0–0.7)
Eosinophils Relative: 2.7 % (ref 0.0–5.0)
HCT: 43.5 % (ref 36.0–46.0)
Hemoglobin: 14.5 g/dL (ref 12.0–15.0)
Lymphocytes Relative: 38.8 % (ref 12.0–46.0)
Lymphs Abs: 1.7 10*3/uL (ref 0.7–4.0)
MCHC: 33.4 g/dL (ref 30.0–36.0)
MCV: 97.4 fl (ref 78.0–100.0)
Monocytes Absolute: 0.3 10*3/uL (ref 0.1–1.0)
Monocytes Relative: 7.6 % (ref 3.0–12.0)
Neutro Abs: 2.2 10*3/uL (ref 1.4–7.7)
Neutrophils Relative %: 50.2 % (ref 43.0–77.0)
Platelets: 196 10*3/uL (ref 150.0–400.0)
RBC: 4.47 Mil/uL (ref 3.87–5.11)
RDW: 12.8 % (ref 11.5–15.5)
WBC: 4.4 10*3/uL (ref 4.0–10.5)

## 2019-03-07 LAB — LIPASE: Lipase: 32 U/L (ref 11.0–59.0)

## 2019-03-18 ENCOUNTER — Other Ambulatory Visit: Payer: Self-pay

## 2019-03-18 ENCOUNTER — Encounter: Payer: Self-pay | Admitting: Gastroenterology

## 2019-03-18 ENCOUNTER — Ambulatory Visit (INDEPENDENT_AMBULATORY_CARE_PROVIDER_SITE_OTHER): Payer: BC Managed Care – PPO | Admitting: Gastroenterology

## 2019-03-18 VITALS — BP 117/80 | HR 59 | Temp 99.1°F | Ht 65.0 in | Wt 163.8 lb

## 2019-03-18 DIAGNOSIS — K219 Gastro-esophageal reflux disease without esophagitis: Secondary | ICD-10-CM

## 2019-03-18 DIAGNOSIS — Z1211 Encounter for screening for malignant neoplasm of colon: Secondary | ICD-10-CM

## 2019-03-18 DIAGNOSIS — R1310 Dysphagia, unspecified: Secondary | ICD-10-CM

## 2019-03-18 NOTE — Patient Instructions (Signed)

## 2019-03-18 NOTE — Progress Notes (Signed)
[name] [address]  [place], [place] [address]  Main: [phone]  Fax: [phone]   Gastroenterology Consultation  Referring Provider:     Pleas Koch, NP Primary Care Physician:  Pleas Koch, NP Reason for Consultation:     Abdominal pain        HPI:    Chief Complaint  Patient presents with  . Abdominal Pain    Diarrhea  . Gastroesophageal Reflux    [name] is a 48 y.o. y/o female referred for consultation & management  by Dr. Carlis Abbott, Leticia Penna, NP.  Patient reports 2 to 3-week history of midepigastric abdominal pain, and odynophagia.  Odynophagia has been going on for 1 to 2 months.  No dysphagia.  Patient was prescribed omeprazole by primary care provider and this has helped the midepigastric pain but not odynophagia.  Denies any heartburn.  No weight loss.  No nausea or vomiting.  No prior upper endoscopy or colonoscopy.  No blood in stool.   No family history of colon cancer.  No NSAID use.  Past Medical History:  Diagnosis Date  . Migraine   . Seizure [facility])     Past Surgical History:  Procedure Laterality Date  . ABDOMINAL HYSTERECTOMY  2012    Prior to Admission medications   Medication Sig Start Date End Date Taking? Authorizing Provider  acetaminophen (TYLENOL) 500 MG tablet Take 1,500 mg by mouth 3 (three) times daily as needed for headache (pain).   Yes [provider]  albuterol (PROVENTIL HFA;VENTOLIN HFA) 108 (90 Base) MCG/ACT inhaler Inhale 1-2 puffs into the lungs every 6 (six) hours as needed for wheezing or shortness of breath. 11/18/18  Yes Hedges, Dellis Filbert, PA-C  escitalopram (LEXAPRO) 20 MG tablet TAKE 1 TABLET BY MOUTH ONCE DAILY 10/21/18  Yes Pleas Koch, NP  Fremanezumab-vfrm (AJOVY) 225 MG/1.5ML SOSY Inject 225 mg into the skin every 30 (thirty) days. 12/25/18  Yes Penumalli, Earlean Polka, MD  NONFORMULARY OR COMPOUNDED ITEM Apply 1-2 g topically 4 (four) times daily. [facility]  Scar Cream -  Verapamil 10%, Pentoxifylline 5% Apply 1-2 grams to affected area 3-4 times daily Qty. 120 gm 3 refills Patient taking differently: Apply 1-2 g topically See admin instructions. [facility]  Scar Cream -  Verapamil 10%, Pentoxifylline 5% Apply 1-2 grams to affected area (feet) 3-4 times daily Qty. 120 gm 3 refills 05/31/18  Yes Evelina Bucy, DPM  omeprazole (PRILOSEC) 20 MG capsule Take 20 mg by mouth daily.   Yes [provider]  PHENobarbital (LUMINAL) 97.2 MG tablet Take 2 tablets (194.4 mg total) by mouth at bedtime. 12/25/18  Yes Penumalli, Earlean Polka, MD  rizatriptan (MAXALT-MLT) 10 MG disintegrating tablet Take 1 tablet (10 mg total) by mouth as needed for migraine. May repeat in 2 hours if needed 12/25/18  Yes Penumalli, Earlean Polka, MD  tiZANidine (ZANAFLEX) 4 MG tablet Take 4 mg by mouth daily as needed (migraine).  03/19/18  Yes [provider]  fluticasone (FLONASE) 50 MCG/ACT nasal spray Place 1 spray into both nostrils daily. 11/18/18   Caccavale, Sophia, PA-C  metoCLOPramide (REGLAN) 10 MG tablet Take 1 tablet (10 mg total) by mouth every 8 (eight) hours as needed for up to 5 days for nausea or vomiting. 10/08/18 10/13/18  Cardama, Grayce Sessions, MD    Family History  Problem Relation Age of Onset  . Hypertension Mother      Social History  Tobacco Use  . Smoking status: Former Smoker    Packs/day: 1.00    Types: Cigarettes  . Smokeless tobacco: Never Used  . Tobacco comment: Quit Janurary 1st  Substance Use Topics  . Alcohol use: No    Alcohol/week: 0.0 standard drinks  . Drug use: No    Allergies as of 03/18/2019 - Review Complete 03/18/2019  Allergen Reaction Noted  . Asa buff (mag [buffered aspirin] Other (See Comments) 10/14/2010  . Azithromycin Other (See Comments) 06/19/2011  . Ibuprofen Other (See Comments) 10/14/2010  . Penicillins Other (See Comments) 10/14/2010    Review of Systems:    All systems  reviewed and negative except where noted in HPI.   Physical Exam:  BP 117/80   Pulse (!) 59   Temp 99.1 F (37.3 C)   Ht 5\' 5"  (1.651 m)   Wt 163 lb 12.8 oz (74.3 kg)   BMI 27.26 kg/m  No LMP recorded. Patient has had a hysterectomy. Psych:  Alert and cooperative. Normal mood and affect. General:   Alert,  Well-developed, well-nourished, pleasant and cooperative in NAD Head:  Normocephalic and atraumatic. Eyes:  Sclera clear, no icterus.   Conjunctiva pink. Ears:  Normal auditory acuity. Nose:  No deformity, discharge, or lesions. Mouth:  No deformity or lesions,oropharynx pink & moist. Neck:  Supple; no masses or thyromegaly. Abdomen:  Normal bowel sounds.  No bruits.  Soft, non-tender and non-distended without masses, hepatosplenomegaly or hernias noted.  No guarding or rebound tenderness.    Msk:  Symmetrical without gross deformities. Good, equal movement & strength bilaterally. Pulses:  Normal pulses noted. Extremities:  No clubbing or edema.  No cyanosis. Neurologic:  Alert and oriented x3;  grossly normal neurologically. Skin:  Intact without significant lesions or rashes. No jaundice. Lymph Nodes:  No significant cervical adenopathy. Psych:  Alert and cooperative. Normal mood and affect.   Labs: CBC    Component Value Date/Time   WBC 4.4 03/07/2019 0835   RBC 4.47 03/07/2019 0835   HGB 14.5 03/07/2019 0835   HGB 14.4 10/22/2015 1206   HCT 43.5 03/07/2019 0835   HCT 40.9 10/22/2015 1206   PLT 196.0 03/07/2019 0835   PLT 221 10/22/2015 1206   MCV 97.4 03/07/2019 0835   MCV 95 10/22/2015 1206   MCH 31.8 10/08/2018 0058   MCHC 33.4 03/07/2019 0835   RDW 12.8 03/07/2019 0835   RDW 13.0 10/22/2015 1206   LYMPHSABS 1.7 03/07/2019 0835   LYMPHSABS 1.8 10/22/2015 1206   MONOABS 0.3 03/07/2019 0835   EOSABS 0.1 03/07/2019 0835   EOSABS 0.2 10/22/2015 1206   BASOSABS 0.0 03/07/2019 0835   BASOSABS 0.0 10/22/2015 1206   CMP     Component Value Date/Time   NA 140  03/07/2019 0835   NA 140 10/22/2015 1206   K 3.9 03/07/2019 0835   CL 106 03/07/2019 0835   CO2 27 03/07/2019 0835   GLUCOSE 82 03/07/2019 0835   BUN 8 03/07/2019 0835   BUN 9 10/22/2015 1206   CREATININE 0.77 03/07/2019 0835   CREATININE 0.63 11/30/2017 1549   CALCIUM 8.7 03/07/2019 0835   PROT 6.8 03/07/2019 0835   PROT 6.5 10/22/2015 1206   ALBUMIN 4.2 03/07/2019 0835   ALBUMIN 4.1 10/22/2015 1206   AST 11 03/07/2019 0835   ALT 12 03/07/2019 0835   ALKPHOS 72 03/07/2019 0835   BILITOT 0.4 03/07/2019 0835   BILITOT 0.2 10/22/2015 1206   GFRNONAA >60 10/08/2018 0058   GFRAA >60 10/08/2018  0762    Imaging Studies: No results found.  Assessment and Plan:   LASHEENA FRIEZE is a 48 y.o. y/o female has been referred for abdominal pain  Abdominal pain consistent with GERD However above, patient is also reporting odynophagia, therefore EGD is recommended for further evaluation  Patient educated extensively on acid reflux lifestyle modification, including buying a bed wedge, not eating 3 hrs before bedtime, diet modifications, and handout given for the same.   Omeprazole has helped the midepigastric pain therefore continue and we can change dosing as necessary after the procedure  (Risks of PPI use were discussed with patient including bone loss, C. Diff diarrhea, pneumonia, infections, CKD, electrolyte abnormalities.  Pt. Verbalizes understanding and chooses to continue the medication.)  Patient advised to eat a soft diet as odynophagia seems to only occur with hard meats or breads  Patient is also due for screening colonoscopy and is agreeable to scheduling with her EGD  I have discussed alternative options, risks & benefits,  which include, but are not limited to, bleeding, infection, perforation,respiratory complication & drug reaction.  The patient agrees with this plan & written consent will be obtained.      Dr [name]  Speech recognition software was used  to dictate the above note.

## 2019-03-19 ENCOUNTER — Other Ambulatory Visit: Payer: Self-pay

## 2019-03-19 ENCOUNTER — Telehealth: Payer: Self-pay

## 2019-03-19 DIAGNOSIS — Z1211 Encounter for screening for malignant neoplasm of colon: Secondary | ICD-10-CM

## 2019-03-19 DIAGNOSIS — K219 Gastro-esophageal reflux disease without esophagitis: Secondary | ICD-10-CM

## 2019-03-19 NOTE — Telephone Encounter (Signed)
Patient has been contacted to see if she would be willing to change the location and date of her colonoscopy to [facility] from [facility] on 04/01/19 because Dr. Romualdo Bolk is not at [facility] on this day.  She has agreed to do so.  Contacted Trish in Endo to let her know-referral updated. 03/31/19 [place] is the new date and location.  Thanks [employer]

## 2019-03-25 ENCOUNTER — Encounter: Payer: Self-pay | Admitting: *Deleted

## 2019-03-25 ENCOUNTER — Other Ambulatory Visit: Payer: Self-pay

## 2019-03-26 ENCOUNTER — Telehealth: Payer: Self-pay

## 2019-03-26 ENCOUNTER — Encounter: Payer: Self-pay | Admitting: Anesthesiology

## 2019-03-26 NOTE — Telephone Encounter (Signed)
She was scheduled to have an echo before covid happened. They postponed because off covid. She only has to be seen if cardiology will not OK her without being seen. We just need them to say she is OK for anesthesia at an out patient facility.   Patient has been contacted in regards to message.  LVM for her to call office.  Reviewed chart and doesn't appear that she has been seen by a cardiologist.  Thanks Sharyn Lull

## 2019-03-27 ENCOUNTER — Other Ambulatory Visit
Admission: RE | Admit: 2019-03-27 | Discharge: 2019-03-27 | Disposition: A | Discharge status: Home / Self Care | Payer: BC Managed Care – PPO | Source: Ambulatory Visit | Attending: Gastroenterology | Admitting: Gastroenterology

## 2019-03-27 ENCOUNTER — Other Ambulatory Visit: Payer: Self-pay

## 2019-03-27 DIAGNOSIS — Z20828 Contact with and (suspected) exposure to other viral communicable diseases: Secondary | ICD-10-CM | POA: Diagnosis present

## 2019-03-27 LAB — SARS CORONAVIRUS 2 (TAT 6-24 HRS): SARS Coronavirus 2: NEGATIVE

## 2019-03-27 NOTE — Discharge Instructions (Signed)

## 2019-03-31 ENCOUNTER — Ambulatory Visit: Admit: 2019-03-31 | Payer: BC Managed Care – PPO | Admitting: Gastroenterology

## 2019-03-31 ENCOUNTER — Ambulatory Visit: Payer: BC Managed Care – PPO | Admitting: Gastroenterology

## 2019-03-31 ENCOUNTER — Telehealth: Payer: Self-pay

## 2019-03-31 DIAGNOSIS — R0602 Shortness of breath: Secondary | ICD-10-CM

## 2019-03-31 DIAGNOSIS — R0609 Other forms of dyspnea: Secondary | ICD-10-CM

## 2019-03-31 DIAGNOSIS — Z01810 Encounter for preprocedural cardiovascular examination: Secondary | ICD-10-CM

## 2019-03-31 DIAGNOSIS — R002 Palpitations: Secondary | ICD-10-CM

## 2019-03-31 HISTORY — DX: Dizziness and giddiness: R42

## 2019-03-31 HISTORY — DX: Nausea with vomiting, unspecified: R11.2

## 2019-03-31 HISTORY — DX: Unspecified asthma, uncomplicated: J45.909

## 2019-03-31 HISTORY — DX: Nausea with vomiting, unspecified: Z98.890

## 2019-03-31 HISTORY — DX: Gastro-esophageal reflux disease without esophagitis: K21.9

## 2019-03-31 HISTORY — DX: Other specified postprocedural states: R11.2

## 2019-03-31 SURGERY — COLONOSCOPY WITH PROPOFOL
Anesthesia: Choice

## 2019-03-31 NOTE — Telephone Encounter (Signed)
Please schedule in office visit with APP, clearance is not an appropriate virtual visit.  Thanks so much, GA

## 2019-03-31 NOTE — Telephone Encounter (Signed)
Hi [name], I would recommend she complete at least the echocardiogram, and have a visit in our office since it has been over 6 mo, she can be scheduled in pre op or with an APP.   If her symptoms are stable on review in office, can press forward with GI eval.

## 2019-03-31 NOTE — Telephone Encounter (Signed)
Please arrange for her to have Echo and then visit with Dr. Margaretann Loveless or APP - per Dr. Margaretann Loveless.      Primary Cardiologist:Gayatri Stann Mainland, MD  Chart reviewed as part of pre-operative protocol coverage. Because of [name]'s past medical history and time since last visit, he/she will require a follow-up visit in order to better assess preoperative cardiovascular risk.  Pre-op covering staff: - Please schedule appointment and call patient to inform them.  Pt was to have an echo but has not been done - discussed with Dr. Margaretann Loveless and she wants echo and visit to clear.   - Please contact requesting surgeon's office via preferred method (i.e, phone, fax) to inform them of need for appointment prior to surgery.  If applicable, this message will also be routed to pharmacy pool and/or primary cardiologist for input on holding anticoagulant/antiplatelet agent as requested below so that this information is available at time of patient's appointment.   [name], NP  03/31/2019, 1:01 PM

## 2019-03-31 NOTE — Telephone Encounter (Signed)
Pt has been scheduled for her echocardiogram 04/07/2019 and for an in-office follow up appointment with Jory Sims, NP, 04/08/2019.  Will fwd to requesting surgeon's office to make them aware.

## 2019-03-31 NOTE — Telephone Encounter (Signed)
Ok thanks 

## 2019-03-31 NOTE — Telephone Encounter (Signed)
Call placed to pt re: surgical clearance and need for a Echocardiogram and an appointment before can be cleared. Order in for Echo.  If pt calls, needs echo schedule and f/u appt with Dr. Margaretann Loveless / APP.

## 2019-03-31 NOTE — Telephone Encounter (Signed)
   [facility] Pre-operative Risk Assessment    Request for surgical clearance:  1. What type of surgery is being performed? COLONOSCOPY W/EGD   2. When is this surgery scheduled?   TBD  3. What type of clearance is required (medical clearance vs. Pharmacy clearance to hold med vs. Both)?  MEDICAL  4. Are there any medications that need to be held prior to surgery and how long? NO   5. Practice name and name of physician performing surgery? [facility]   6. What is your office phone number [phone]    7.   What is your office fax number [phone]  8.   Anesthesia type (None, local, MAC, general) ? NOT LISTED   [name] 03/31/2019, 10:56 AM  _________________________________________________________________   (provider comments below)

## 2019-03-31 NOTE — Telephone Encounter (Signed)
Dr. Margaretann Loveless - pt needs colonoscopy and EGD.  If she is stable can she proceed?  She needed echo but that has not been done does that need to be done before GI eval .  I have left message for pt to call back -   Thanks for your help.  Mickel Baas

## 2019-04-01 ENCOUNTER — Ambulatory Visit: Admit: 2019-04-01 | Payer: BC Managed Care – PPO | Admitting: Gastroenterology

## 2019-04-01 SURGERY — COLONOSCOPY WITH PROPOFOL
Anesthesia: General

## 2019-04-03 ENCOUNTER — Other Ambulatory Visit: Payer: Self-pay | Admitting: Internal Medicine

## 2019-04-03 ENCOUNTER — Other Ambulatory Visit: Payer: Self-pay | Admitting: Primary Care

## 2019-04-03 DIAGNOSIS — Z1231 Encounter for screening mammogram for malignant neoplasm of breast: Secondary | ICD-10-CM

## 2019-04-07 ENCOUNTER — Ambulatory Visit (HOSPITAL_COMMUNITY): Payer: BC Managed Care – PPO | Attending: Cardiology

## 2019-04-07 ENCOUNTER — Other Ambulatory Visit: Payer: Self-pay

## 2019-04-07 DIAGNOSIS — Z01810 Encounter for preprocedural cardiovascular examination: Secondary | ICD-10-CM

## 2019-04-07 DIAGNOSIS — R002 Palpitations: Secondary | ICD-10-CM

## 2019-04-07 DIAGNOSIS — R0602 Shortness of breath: Secondary | ICD-10-CM | POA: Diagnosis present

## 2019-04-07 DIAGNOSIS — R0609 Other forms of dyspnea: Secondary | ICD-10-CM | POA: Diagnosis present

## 2019-04-07 NOTE — Progress Notes (Signed)
Cardiology Office Note   Date:  04/08/2019   ID:  ANDRA HESLIN, DOB 03/19/1971, MRN [id number]  PCP:  Pleas Koch, NP  Cardiologist:  Margaretann Loveless  CC: Preoperative Cardiac Evaluation    History of Present Illness: [name] is a 48 y.o. female who presents for pre-operative evaluation for colonoscopy. She was to have echocardiogram prior to this appointment.   She has a history of migraine headaches and seizure disorder, with hx of palpitations and chest pain. She was seen on consultation by Dr. Margaretann Loveless for complaints of chest pain on 07/05/2018. She also complained of DOE with minimal activity. The chest pain was felt to be musculoskeletal. There was also a suspicion for OSA.  An echocardiogram and event monitor was ordered at that time.    `  Past Medical History:  Diagnosis Date  . Asthma    worse when younger  . GERD (gastroesophageal reflux disease)   . Migraine    1-2x/month  . PONV (postoperative nausea and vomiting)   . Seizure ([place])   . Vertigo    no episodes for 7 yrs    Past Surgical History:  Procedure Laterality Date  . ABDOMINAL HYSTERECTOMY  2012     Current Outpatient Medications  Medication Sig Dispense Refill  . acetaminophen (TYLENOL) 500 MG tablet Take 1,500 mg by mouth 3 (three) times daily as needed for headache (pain).    [facility]land Kitchen albuterol (PROVENTIL HFA;VENTOLIN HFA) 108 (90 Base) MCG/ACT inhaler Inhale 1-2 puffs into the lungs every 6 (six) hours as needed for wheezing or shortness of breath. 1 Inhaler 0  . escitalopram (LEXAPRO) 20 MG tablet TAKE 1 TABLET BY MOUTH ONCE DAILY 90 tablet 1  . Fremanezumab-vfrm (AJOVY) 225 MG/1.5ML SOSY Inject 225 mg into the skin every 30 (thirty) days. 3 Syringe 4  . NONFORMULARY OR COMPOUNDED ITEM Apply 1-2 g topically 4 (four) times daily. [facility]  Scar Cream -  Verapamil 10%, Pentoxifylline 5% Apply 1-2 grams to affected area 3-4 times daily Qty. 120 gm 3 refills (Patient taking differently: Apply  1-2 g topically See admin instructions. [facility]  Scar Cream -  Verapamil 10%, Pentoxifylline 5% Apply 1-2 grams to affected area (feet) 3-4 times daily Qty. 120 gm 3 refills) 120 each 2  . omeprazole (PRILOSEC) 20 MG capsule Take 20 mg by mouth daily.    [facility] PHENobarbital (LUMINAL) 97.2 MG tablet Take 2 tablets (194.4 mg total) by mouth at bedtime. 60 tablet 5  . rizatriptan (MAXALT-MLT) 10 MG disintegrating tablet Take 1 tablet (10 mg total) by mouth as needed for migraine. May repeat in 2 hours if needed 9 tablet 11  . fluticasone (FLONASE) 50 MCG/ACT nasal spray Place 1 spray into both nostrils daily. 16 g 0   No current facility-administered medications for this visit.     Allergies:   Asa buff (mag [buffered aspirin], Azithromycin, Ibuprofen, and Penicillins    Social History:  The patient  reports that she quit smoking about 5 years ago. Her smoking use included cigarettes. She smoked 1.00 pack per day. She has never used smokeless tobacco. She reports that she does not drink alcohol or use drugs.   Family History:  The patient's family history includes Hypertension in her mother.    ROS: All other systems are reviewed and negative. Unless otherwise mentioned in H&P    PHYSICAL EXAM: VS:  BP 119/83   Pulse 82   Ht 5\' 5"  (1.651 m)   Wt 166 lb  3.2 oz (75.4 kg)   BMI 27.66 kg/m  , BMI Body mass index is 27.66 kg/m. GEN: Well nourished, well developed, in no acute distress HEENT: normal Neck: no JVD, carotid bruits, or masses Cardiac: RRR; no murmurs, rubs, or gallops,no edema  Respiratory:  Clear to auscultation bilaterally, normal work of breathing GI: soft, nontender, nondistended, + BS MS: no deformity or atrophy Skin: warm and dry, no rash Neuro:  Strength and sensation are intact Psych: euthymic mood, full affect   EKG: Normal sinus rhythm, heart rate of 82 bpm.  No ST-T wave abnormalities, no arrhythmias.  Recent Labs: 07/05/2018: TSH 1.320 03/07/2019:  ALT 12; BUN 8; Creatinine, Ser 0.77; Hemoglobin 14.5; Platelets 196.0; Potassium 3.9; Sodium 140    Lipid Panel    Component Value Date/Time   CHOL 195 11/30/2017 1549   TRIG 167 (H) 11/30/2017 1549   HDL 50 (L) 11/30/2017 1549   CHOLHDL 3.9 11/30/2017 1549   LDLCALC 117 (H) 11/30/2017 1549      Wt Readings from Last 3 Encounters:  04/08/19 166 lb 3.2 oz (75.4 kg)  03/18/19 163 lb 12.8 oz (74.3 kg)  11/18/18 150 lb (68 kg)      Other studies Reviewed: Echocardiogram 04/24/2019 1. The left ventricle has normal systolic function, with an ejection fraction of 55-60%. The cavity size was normal. Left ventricular diastolic parameters were normal. No evidence of left ventricular regional wall motion abnormalities.  2. The right ventricle has normal systolic function. The cavity was normal. There is no increase in right ventricular wall thickness. Right ventricular systolic pressure could not be assessed.  3. The aorta is normal in size and structure.  FINDINGS  Left Ventricle: The left ventricle has normal systolic function, with an ejection fraction of 55-60%. The cavity size was normal. There is no increase in left ventricular wall thickness. Left ventricular diastolic parameters were normal. Normal left  ventricular filling pressures No evidence of left ventricular regional wall motion abnormalities..   ASSESSMENT AND PLAN:  1. Pre-Operative Evaluation:    Chart reviewed as part of pre-operative protocol coverage. Given past medical history and time since last visit, based on ACC/AHA guidelines, [name] would be at acceptable risk for the planned EDG and Colonoscopy without further cardiovascular testing.   2. Chest Pain:  No further chest pain. No planned ischemic testing. She will be seen in one year, or prn. She requests annual visits.    Current medicines are reviewed at length with the patient today.    Labs/ tests ordered today include: None  Phill Myron. [place] Pugh, ANP, AACC   04/08/2019 [facility] [place] [address] Office [phone] Fax [phone]

## 2019-04-08 ENCOUNTER — Encounter: Payer: Self-pay | Admitting: Adult Health

## 2019-04-08 ENCOUNTER — Ambulatory Visit: Payer: BC Managed Care – PPO | Admitting: Adult Health

## 2019-04-08 VITALS — BP 119/83 | HR 82 | Ht 65.0 in | Wt 166.2 lb

## 2019-04-08 DIAGNOSIS — R0789 Other chest pain: Secondary | ICD-10-CM | POA: Diagnosis not present

## 2019-04-08 DIAGNOSIS — Z01810 Encounter for preprocedural cardiovascular examination: Secondary | ICD-10-CM | POA: Diagnosis not present

## 2019-04-08 NOTE — Patient Instructions (Signed)
Medication Instructions:  Continue current medications  If you need a refill on your cardiac medications before your next appointment, please call your pharmacy.  Labwork: None Ordered   Take the provided lab slips with you to the lab for your blood draw.   When you have your labs (blood work) drawn today and your tests are completely normal, you will receive your results only by MyChart Message (if you have MyChart) -OR-  A paper copy in the mail.  If you have any lab test that is abnormal or we need to change your treatment, we will call you to review these results.  Testing/Procedures: None Ordered  Special Instructions: You are cleared for surgery  Follow-Up: You will need a follow up appointment in 1 Year.  Please call our office 2 months in advance to schedule this appointment.  You may see Elouise Munroe, MD or one of the following Advanced Practice Providers on your designated Care Team:   Rosaria Ferries, PA-C . Jory Sims, DNP, ANP     At [facility], you and your health needs are our priority.  As part of our continuing mission to provide you with exceptional heart care, we have created designated Provider Care Teams.  These Care Teams include your primary Cardiologist (physician) and Advanced Practice Providers (APPs -  Physician Assistants and Nurse Practitioners) who all work together to provide you with the care you need, when you need it.  Thank you for choosing [facility] at [facility]!!

## 2019-04-10 ENCOUNTER — Telehealth: Payer: BC Managed Care – PPO | Admitting: Adult Health

## 2019-04-11 NOTE — Telephone Encounter (Signed)
   Primary Cardiologist: Elouise Munroe, MD  Chart reviewed as part of pre-operative protocol coverage. Given past medical history and time since last visit, based on ACC/AHA guidelines, [name] would be at acceptable risk for the planned procedure without further cardiovascular testing.   I will route this recommendation to the requesting party via Epic fax function and remove from pre-op pool.  Please call with questions.  Kerin Ransom, PA-C 04/11/2019, 8:58 AM

## 2019-04-11 NOTE — Telephone Encounter (Signed)
Follow up    Sharyn Lull with [facility] is calling to get the update on the cardiac clearance. Patient has had her appt and echo.

## 2019-04-15 ENCOUNTER — Other Ambulatory Visit: Payer: Self-pay

## 2019-04-15 ENCOUNTER — Telehealth: Payer: Self-pay

## 2019-04-15 DIAGNOSIS — Z1211 Encounter for screening for malignant neoplasm of colon: Secondary | ICD-10-CM

## 2019-04-15 DIAGNOSIS — K219 Gastro-esophageal reflux disease without esophagitis: Secondary | ICD-10-CM

## 2019-04-15 NOTE — Telephone Encounter (Signed)
Cardiac clearance received. Patient has been scheduled colonoscopy/egd on 04/24/19 at [facility].  Patient has been asked to get COVID test on 04/21/19 Monday.  Thanks, Sharyn Lull

## 2019-04-20 ENCOUNTER — Other Ambulatory Visit: Payer: Self-pay | Admitting: Primary Care

## 2019-04-20 DIAGNOSIS — F411 Generalized anxiety disorder: Secondary | ICD-10-CM

## 2019-04-21 ENCOUNTER — Other Ambulatory Visit
Admission: RE | Admit: 2019-04-21 | Discharge: 2019-04-21 | Disposition: A | Discharge status: Home / Self Care | Payer: BC Managed Care – PPO | Source: Ambulatory Visit | Attending: Gastroenterology | Admitting: Gastroenterology

## 2019-04-21 ENCOUNTER — Other Ambulatory Visit: Payer: Self-pay

## 2019-04-21 DIAGNOSIS — Z20828 Contact with and (suspected) exposure to other viral communicable diseases: Secondary | ICD-10-CM | POA: Insufficient documentation

## 2019-04-21 DIAGNOSIS — Z01812 Encounter for preprocedural laboratory examination: Secondary | ICD-10-CM | POA: Insufficient documentation

## 2019-04-21 LAB — SARS CORONAVIRUS 2 (TAT 6-24 HRS): SARS Coronavirus 2: NEGATIVE

## 2019-04-23 ENCOUNTER — Encounter: Payer: Self-pay | Admitting: Anesthesiology

## 2019-04-24 ENCOUNTER — Other Ambulatory Visit: Payer: Self-pay

## 2019-04-24 ENCOUNTER — Telehealth: Payer: Self-pay

## 2019-04-24 ENCOUNTER — Ambulatory Visit: Payer: BC Managed Care – PPO

## 2019-04-24 ENCOUNTER — Ambulatory Visit
Admission: RE | Admit: 2019-04-24 | Discharge: 2019-04-24 | Disposition: A | Discharge status: Home / Self Care | Payer: BC Managed Care – PPO | Attending: Gastroenterology | Admitting: Gastroenterology

## 2019-04-24 ENCOUNTER — Ambulatory Visit: Payer: BC Managed Care – PPO | Admitting: Anesthesiology

## 2019-04-24 ENCOUNTER — Encounter
Admission: RE | Disposition: A | Discharge status: Home / Self Care | Payer: Self-pay | Source: Home / Self Care | Attending: Gastroenterology

## 2019-04-24 DIAGNOSIS — F419 Anxiety disorder, unspecified: Secondary | ICD-10-CM | POA: Diagnosis not present

## 2019-04-24 DIAGNOSIS — R52 Pain, unspecified: Secondary | ICD-10-CM

## 2019-04-24 DIAGNOSIS — Z1211 Encounter for screening for malignant neoplasm of colon: Secondary | ICD-10-CM

## 2019-04-24 DIAGNOSIS — J45909 Unspecified asthma, uncomplicated: Secondary | ICD-10-CM | POA: Diagnosis not present

## 2019-04-24 DIAGNOSIS — K219 Gastro-esophageal reflux disease without esophagitis: Secondary | ICD-10-CM

## 2019-04-24 DIAGNOSIS — Z01419 Encounter for gynecological examination (general) (routine) without abnormal findings: Secondary | ICD-10-CM

## 2019-04-24 DIAGNOSIS — R1013 Epigastric pain: Secondary | ICD-10-CM

## 2019-04-24 DIAGNOSIS — Z79899 Other long term (current) drug therapy: Secondary | ICD-10-CM | POA: Insufficient documentation

## 2019-04-24 DIAGNOSIS — K228 Other specified diseases of esophagus: Secondary | ICD-10-CM

## 2019-04-24 DIAGNOSIS — K2289 Other specified disease of esophagus: Secondary | ICD-10-CM

## 2019-04-24 DIAGNOSIS — R1310 Dysphagia, unspecified: Secondary | ICD-10-CM

## 2019-04-24 DIAGNOSIS — K2100 Gastro-esophageal reflux disease with esophagitis, without bleeding: Secondary | ICD-10-CM

## 2019-04-24 DIAGNOSIS — Z87891 Personal history of nicotine dependence: Secondary | ICD-10-CM | POA: Insufficient documentation

## 2019-04-24 DIAGNOSIS — R1319 Other dysphagia: Secondary | ICD-10-CM

## 2019-04-24 HISTORY — PX: ESOPHAGOGASTRODUODENOSCOPY (EGD) WITH PROPOFOL: SHX5813

## 2019-04-24 HISTORY — PX: COLONOSCOPY WITH PROPOFOL: SHX5780

## 2019-04-24 LAB — KOH PREP: KOH Prep: NONE SEEN

## 2019-04-24 SURGERY — COLONOSCOPY WITH PROPOFOL
Anesthesia: General

## 2019-04-24 MED ORDER — EPHEDRINE SULFATE 50 MG/ML IJ SOLN
INTRAMUSCULAR | Status: AC
  Filled 2019-04-24: qty 1

## 2019-04-24 MED ORDER — BISACODYL EC 5 MG PO TBEC: DELAYED_RELEASE_TABLET | ORAL | 0 refills | Status: DC

## 2019-04-24 MED ORDER — PROPOFOL 10 MG/ML IV BOLUS
INTRAVENOUS | Status: AC
  Filled 2019-04-24: qty 20

## 2019-04-24 MED ORDER — LIDOCAINE HCL (CARDIAC) PF 100 MG/5ML IV SOSY
PREFILLED_SYRINGE | INTRAVENOUS | Status: DC | PRN
  Administered 2019-04-24: 50 mg via INTRAVENOUS

## 2019-04-24 MED ORDER — PROPOFOL 10 MG/ML IV BOLUS
INTRAVENOUS | Status: DC | PRN
  Administered 2019-04-24: 50 mg via INTRAVENOUS

## 2019-04-24 MED ORDER — ONDANSETRON HCL 4 MG/2ML IJ SOLN
INTRAMUSCULAR | Status: AC
  Administered 2019-04-24: 4 mg
  Filled 2019-04-24: qty 2

## 2019-04-24 MED ORDER — OMEPRAZOLE 20 MG PO CPDR: 20.0000 mg | DELAYED_RELEASE_CAPSULE | Freq: Two times a day (BID) | ORAL | 0 refills | Status: DC

## 2019-04-24 MED ORDER — PROPOFOL 500 MG/50ML IV EMUL
INTRAVENOUS | Status: DC | PRN
  Administered 2019-04-24: 175 ug/kg/min via INTRAVENOUS

## 2019-04-24 MED ORDER — ONDANSETRON HCL 4 MG/2ML IJ SOLN: 4.0000 mg | Freq: Once | INTRAMUSCULAR | Status: DC

## 2019-04-24 MED ORDER — PHENYLEPHRINE HCL (PRESSORS) 10 MG/ML IV SOLN
INTRAVENOUS | Status: AC
  Filled 2019-04-24: qty 1

## 2019-04-24 MED ORDER — SODIUM CHLORIDE 0.9 % IV SOLN
INTRAVENOUS | Status: DC
  Administered 2019-04-24: 1000 mL via INTRAVENOUS
  Administered 2019-04-24: 08:00:00 via INTRAVENOUS

## 2019-04-24 MED ORDER — PEG 3350-KCL-NA BICARB-NACL 420 G PO SOLR: 4000.0000 mL | Freq: Once | ORAL | 0 refills | Status: AC

## 2019-04-24 MED ORDER — LIDOCAINE HCL (PF) 2 % IJ SOLN
INTRAMUSCULAR | Status: AC
  Filled 2019-04-24: qty 10

## 2019-04-24 MED ORDER — PHENYLEPHRINE HCL (PRESSORS) 10 MG/ML IV SOLN
INTRAVENOUS | Status: DC | PRN
  Administered 2019-04-24: 100 ug via INTRAVENOUS

## 2019-04-24 MED ORDER — PROPOFOL 500 MG/50ML IV EMUL
INTRAVENOUS | Status: AC
  Filled 2019-04-24: qty 50

## 2019-04-24 NOTE — Anesthesia Post-op Follow-up Note (Signed)
Anesthesia QCDR form completed.        

## 2019-04-24 NOTE — Op Note (Signed)
[facility] Gastroenterology Patient Name: [name] Procedure Date: 04/24/2019 7:35 AM MRN: [id number] Account #: [id number] Date of Birth: 10/05/1970 Admit Type: Outpatient Age: 48 Room: [facility] ENDO ROOM 1 Gender: Female Note Status: Finalized Procedure:            Upper GI endoscopy Indications:          Epigastric abdominal pain, Odynophagia Providers:            Oneal Biglow B. Bonna Gains MD, MD Referring MD:         Pleas Koch (Referring MD) Medicines:            Monitored Anesthesia Care Complications:        No immediate complications. Procedure:            Pre-Anesthesia Assessment:                       - Prior to the procedure, a History and Physical was                        performed, and patient medications, allergies and                        sensitivities were reviewed. The patient's tolerance of                        previous anesthesia was reviewed.                       - The risks and benefits of the procedure and the                        sedation options and risks were discussed with the                        patient. All questions were answered and informed                        consent was obtained.                       - Patient identification and proposed procedure were                        verified prior to the procedure by the physician, the                        nurse, the anesthesiologist, the anesthetist and the                        technician. The procedure was verified in the procedure                        room.                       - ASA Grade Assessment: II - A patient with mild                        systemic disease.  After obtaining informed consent, the endoscope was                        passed under direct vision. Throughout the procedure,                        the patient's blood pressure, pulse, and oxygen                        saturations were monitored continuously. The  Endoscope                        was introduced through the mouth, and advanced to the                        second part of duodenum. The upper GI endoscopy was                        accomplished with ease. The patient tolerated the                        procedure well. Findings:      LA Grade A (one or more mucosal breaks less than 5 mm, not extending       between tops of 2 mucosal folds) esophagitis with no bleeding was found       at the gastroesophageal junction.      White nummular lesions were noted in the distal esophagus. Brushings for       KOH prep were obtained.      There is no endoscopic evidence of stenosis or stricture in the entire       esophagus. Biopsies were obtained from the proximal and distal esophagus       with cold forceps for histology of suspected eosinophilic esophagitis.      The entire examined stomach was normal. Biopsies were obtained in the       gastric body, at the incisura and in the gastric antrum with cold       forceps for histology. Biopsies were taken with a cold forceps for       Helicobacter pylori testing.      The duodenal bulb, second portion of the duodenum and examined duodenum       were normal. Impression:           - LA Grade A reflux esophagitis.                       - White nummular lesions in esophageal mucosa.                        Brushings performed.                       - Normal stomach. Biopsied.                       - Normal duodenal bulb, second portion of the duodenum                        and examined duodenum.                       - Biopsies  were obtained in the gastric body, at the                        incisura and in the gastric antrum. Recommendation:       - Discharge patient to home (with escort).                       - Advance diet as tolerated.                       - Continue present medications.                       - Patient has a contact number available for                        emergencies.  The signs and symptoms of potential                        delayed complications were discussed with the patient.                        Return to normal activities tomorrow. Written discharge                        instructions were provided to the patient.                       - Discharge patient to home (with escort).                       - The findings and recommendations were discussed with                        the patient.                       - The findings and recommendations were discussed with                        the patient's family.                       - Follow an antireflux regimen.                       - Take prescribed proton pump inhibitor or H2 blocker                        (antacid) medications 30 - 60 minutes before meals. Procedure Code(s):    --- Professional ---                       [phone], Esophagogastroduodenoscopy, flexible, transoral;                        with biopsy, single or multiple Diagnosis Code(s):    --- Professional ---                       K21.0, Gastro-esophageal reflux disease with esophagitis  K22.8, Other specified diseases of esophagus                       R10.13, Epigastric pain                       R13.10, Dysphagia, unspecified CPT copyright 2019 American Medical Association. All rights reserved. The codes documented in this report are preliminary and upon coder review may  be revised to meet current compliance requirements.  [name], MD Margretta Sidle B. Bonna Gains MD, MD 04/24/2019 8:38:38 AM This report has been signed electronically. Number of Addenda: 0 Note Initiated On: 04/24/2019 7:35 AM Estimated Blood Loss: Estimated blood loss: none.      [facility]

## 2019-04-24 NOTE — Telephone Encounter (Signed)
Called and left a message for call back  

## 2019-04-24 NOTE — Anesthesia Procedure Notes (Signed)
Date/Time: 04/24/2019 8:16 AM Performed by: Johnna Acosta, CRNA Pre-anesthesia Checklist: Patient identified, Emergency Drugs available, Suction available, Patient being monitored and Timeout performed Patient Re-evaluated:Patient Re-evaluated prior to induction Oxygen Delivery Method: Nasal cannula Preoxygenation: Pre-oxygenation with 100% oxygen Induction Type: IV induction

## 2019-04-24 NOTE — Telephone Encounter (Signed)
Informed patient that the prep was sent to the pharmacy and to start the prep at 5pm and then 5 hours before procedure. Informed patient that she needed to be on a clear liquid diet today. She also needed to take Bisacodyl 10mg  2 hours before prep. Informed patient to call edo unit today between 1-3 for a time for her procedure

## 2019-04-24 NOTE — Telephone Encounter (Signed)
Patient was not clean out and is needing to get rescheduled to tomorrow. Patient needs new prep sent to pharmacy., Sent medication to [facility]

## 2019-04-24 NOTE — OR Nursing (Signed)
Flat place shows air in colon and stomach. Pt passing air. Pt states she feels less discomfort. Nauseau relieved by zofran. DR. Tahilinia in to check pt then d/ced home.

## 2019-04-24 NOTE — Op Note (Signed)
[facility] Gastroenterology Patient Name: [name] Procedure Date: 04/24/2019 7:33 AM MRN: [id number] Account #: [id number] Date of Birth: 07/17/1971 Admit Type: Outpatient Age: 48 Room: [facility] ENDO ROOM 1 Gender: Female Note Status: Finalized Procedure:            Colonoscopy Indications:          Screening for colorectal malignant neoplasm Providers:            Morton Simson B. Bonna Gains MD, MD Medicines:            Monitored Anesthesia Care Complications:        No immediate complications. Procedure:            Pre-Anesthesia Assessment:                       - Prior to the procedure, a History and Physical was                        performed, and patient medications, allergies and                        sensitivities were reviewed. The patient's tolerance of                        previous anesthesia was reviewed.                       - The risks and benefits of the procedure and the                        sedation options and risks were discussed with the                        patient. All questions were answered and informed                        consent was obtained.                       - Patient identification and proposed procedure were                        verified prior to the procedure by the physician, the                        nurse, the anesthesiologist, the anesthetist and the                        technician. The procedure was verified in the                        pre-procedure area in the procedure room in the                        endoscopy suite.                       - Prophylactic Antibiotics: The patient does not                        require prophylactic antibiotics.                       -  ASA Grade Assessment: II - A patient with mild                        systemic disease.                       - After reviewing the risks and benefits, the patient                        was deemed in satisfactory condition to undergo the                       procedure.                       - Monitored anesthesia care was determined to be                        medically necessary for this procedure based on review                        of the patient's medical history, medications, and                        prior anesthesia history.                       - The anesthesia plan was to use monitored anesthesia                        care (MAC).                       After obtaining informed consent, the colonoscope was                        passed under direct vision. Throughout the procedure,                        the patient's blood pressure, pulse, and oxygen                        saturations were monitored continuously. The                        Colonoscope was introduced through the anus and                        advanced to the the sigmoid colon. The colonoscopy was                        performed with ease. The patient tolerated the                        procedure well. The quality of the bowel preparation                        was poor. Findings:      The perianal and digital rectal examinations were normal.      A large amount of stool was found in the sigmoid colon, precluding       visualization. Impression:           -  Preparation of the colon was poor.                       - Stool in the sigmoid colon.                       - No specimens collected. Recommendation:       - Repeat colonoscopy at the next available appointment                        because the bowel preparation was poor and for                        screening purposes.                       - Continue present medications.                       - Return to my office as previously scheduled.                       - The findings and recommendations were discussed with                        the patient.                       - The findings and recommendations were discussed with                        the patient's family. Procedure  Code(s):    --- Professional ---                       [phone], 67, Colonoscopy, flexible; diagnostic, including                        collection of specimen(s) by brushing or washing, when                        performed (separate procedure) Diagnosis Code(s):    --- Professional ---                       Z12.11, Encounter for screening for malignant neoplasm                        of colon CPT copyright 2019 American Medical Association. All rights reserved. The codes documented in this report are preliminary and upon coder review may  be revised to meet current compliance requirements.  [name], MD Margretta Sidle B. Bonna Gains MD, MD 04/24/2019 8:56:57 AM This report has been signed electronically. Number of Addenda: 0 Note Initiated On: 04/24/2019 7:33 AM Total Procedure Duration: 0 hours 2 minutes 29 seconds  Estimated Blood Loss: Estimated blood loss: none.      [facility]

## 2019-04-24 NOTE — Telephone Encounter (Signed)
Per Dr. Bonna Gains she wants patient to take Bisacodyl 10mg  2 hours before starting prep.

## 2019-04-24 NOTE — H&P (Signed)
Sandy Antigua, MD [address], [place], [place], [place], [address] [address], [place], [place], [place], [address] Phone: [phone]  Fax: [phone]  Primary Care Physician:  Pleas Koch, NP   Pre-Procedure History & Physical: HPI:  [name] is a 48 y.o. female is here for a colonoscopy and EGD.   Past Medical History:  Diagnosis Date  . Asthma    worse when younger  . GERD (gastroesophageal reflux disease)   . Migraine    1-2x/month  . PONV (postoperative nausea and vomiting)   . Seizure ([place])   . Vertigo    no episodes for 7 yrs    Past Surgical History:  Procedure Laterality Date  . ABDOMINAL HYSTERECTOMY  2012    Prior to Admission medications   Medication Sig Start Date End Date Taking? Authorizing Provider  acetaminophen (TYLENOL) 500 MG tablet Take 1,500 mg by mouth 3 (three) times daily as needed for headache (pain).   Yes [provider]  escitalopram (LEXAPRO) 20 MG tablet Take 1 tablet (20 mg total) by mouth daily. DUE FOR A FOLLOW UP APPOINTMENT 04/21/19  Yes Pleas Koch, NP  PHENobarbital (LUMINAL) 97.2 MG tablet Take 2 tablets (194.4 mg total) by mouth at bedtime. 12/25/18  Yes Penumalli, Earlean Polka, MD  albuterol (PROVENTIL HFA;VENTOLIN HFA) 108 (90 Base) MCG/ACT inhaler Inhale 1-2 puffs into the lungs every 6 (six) hours as needed for wheezing or shortness of breath. Patient not taking: Reported on 04/24/2019 11/18/18   Hedges, Dellis Filbert, PA-C  fluticasone [facility]) 50 MCG/ACT nasal spray Place 1 spray into both nostrils daily. 11/18/18   Caccavale, Sophia, PA-C  Fremanezumab-vfrm (AJOVY) 225 MG/1.5ML SOSY Inject 225 mg into the skin every 30 (thirty) days. Patient not taking: Reported on 04/24/2019 12/25/18   Penumalli, Earlean Polka, MD  NONFORMULARY OR COMPOUNDED ITEM Apply 1-2 g topically 4 (four) times daily. [facility]  Scar Cream -  Verapamil 10%, Pentoxifylline 5% Apply 1-2 grams to affected area 3-4 times  daily Qty. 120 gm 3 refills Patient taking differently: Apply 1-2 g topically See admin instructions. [facility]  Scar Cream -  Verapamil 10%, Pentoxifylline 5% Apply 1-2 grams to affected area (feet) 3-4 times daily Qty. 120 gm 3 refills 05/31/18   Evelina Bucy, DPM  omeprazole (PRILOSEC) 20 MG capsule Take 20 mg by mouth daily.    [provider]  rizatriptan (MAXALT-MLT) 10 MG disintegrating tablet Take 1 tablet (10 mg total) by mouth as needed for migraine. May repeat in 2 hours if needed Patient not taking: Reported on 04/24/2019 12/25/18   [name], MD    Allergies as of 04/15/2019 - Review Complete 04/08/2019  Allergen Reaction Noted  . Asa buff (mag [buffered aspirin] Other (See Comments) 10/14/2010  . Azithromycin Other (See Comments) 06/19/2011  . Ibuprofen Other (See Comments) 10/14/2010  . Penicillins Other (See Comments) 10/14/2010    Family History  Problem Relation Age of Onset  . Hypertension Mother     Social History   Socioeconomic History  . Marital status: Widowed    Spouse name: [name]  . Number of children: 0  . Years of education: 10th  . Highest education level: Not on file  Occupational History    Employer: [employer]  . Financial resource strain: Not on file  . Food insecurity    Worry: Not on file    Inability: Not on file  . Transportation needs    Medical: Not  on file    Non-medical: Not on file  Tobacco Use  . Smoking status: Former Smoker    Packs/day: 1.00    Types: Cigarettes    Quit date: 08/28/2013    Years since quitting: 5.6  . Smokeless tobacco: Never Used  . Tobacco comment: Quit Janurary 1st  Substance and Sexual Activity  . Alcohol use: No    Alcohol/week: 0.0 standard drinks  . Drug use: No  . Sexual activity: Not on file  Lifestyle  . Physical activity    Days per week: Not on file    Minutes per session: Not on file  . Stress: Not on file  Relationships  . Social Product manager on phone: Not on file    Gets together: Not on file    Attends religious service: Not on file    Active member of club or organization: Not on file    Attends meetings of clubs or organizations: Not on file    Relationship status: Not on file  . Intimate partner violence    Fear of current or ex partner: Not on file    Emotionally abused: Not on file    Physically abused: Not on file    Forced sexual activity: Not on file  Other Topics Concern  . Not on file  Social History Narrative   Patient lives at home with spouse.   No children.   Works at [employer].   Enjoys going to [employer], going to estate sells.   Right handed   Caffeine: 1 Dr. [name] every morning    Review of Systems: See HPI, otherwise negative ROS  Physical Exam: BP (!) 135/98   Pulse (!) 104   Temp 98.5 F (36.9 C) (Oral)   Resp 18   Ht 5\' 5"  (1.651 m)   Wt 75.3 kg   SpO2 98%   BMI 27.62 kg/m  General:   Alert,  pleasant and cooperative in NAD Head:  Normocephalic and atraumatic. Neck:  Supple; no masses or thyromegaly. Lungs:  Clear throughout to auscultation, normal respiratory effort.    Heart:  +S1, +S2, Regular rate and rhythm, No edema. Abdomen:  Soft, nontender and nondistended. Normal bowel sounds, without guarding, and without rebound.   Neurologic:  Alert and  oriented x4;  grossly normal neurologically.  Impression/Plan: [name] is here for a colonoscopy to be performed for average risk screening and EGD for dysphagia  Risks, benefits, limitations, and alternatives regarding the procedures have been reviewed with the patient.  Questions have been answered.  All parties agreeable.   Virgel Manifold, MD  04/24/2019, 8:14 AM

## 2019-04-24 NOTE — Anesthesia Postprocedure Evaluation (Signed)
Anesthesia Post Note  Patient: [name]  Procedure(s) Performed: COLONOSCOPY WITH PROPOFOL (N/A ) ESOPHAGOGASTRODUODENOSCOPY (EGD) WITH PROPOFOL (N/A )  Patient location during evaluation: Endoscopy Anesthesia Type: General Level of consciousness: awake and alert Pain management: pain level controlled Vital Signs Assessment: post-procedure vital signs reviewed and stable Respiratory status: spontaneous breathing, nonlabored ventilation, respiratory function stable and patient connected to nasal cannula oxygen Cardiovascular status: blood pressure returned to baseline and stable Postop Assessment: no apparent nausea or vomiting Anesthetic complications: no     Last Vitals:  Vitals:   04/24/19 1020 04/24/19 1030  BP: 106/66 110/69  Pulse: 71 67  Resp:  (!) 21  Temp:    SpO2: 96% 97%    Last Pain:  Vitals:   04/24/19 0840  TempSrc: Tympanic  PainSc:                  [name] S

## 2019-04-24 NOTE — Transfer of Care (Signed)
Immediate Anesthesia Transfer of Care Note  Patient: [name]  Procedure(s) Performed: COLONOSCOPY WITH PROPOFOL (N/A ) ESOPHAGOGASTRODUODENOSCOPY (EGD) WITH PROPOFOL (N/A )  Patient Location: PACU  Anesthesia Type:General  Level of Consciousness: sedated  Airway & Oxygen Therapy: Patient Spontanous Breathing and Patient connected to nasal cannula oxygen  Post-op Assessment: Report given to RN and Post -op Vital signs reviewed and stable  Post vital signs: Reviewed and stable  Last Vitals:  Vitals Value Taken Time  BP 84/61 04/24/19 0851  Temp    Pulse 91 04/24/19 0851  Resp 17 04/24/19 0851  SpO2 97 % 04/24/19 0851  Vitals shown include unvalidated device data.  Last Pain:  Vitals:   04/24/19 0840  TempSrc: Tympanic  PainSc:          Complications: No apparent anesthesia complications

## 2019-04-24 NOTE — Anesthesia Preprocedure Evaluation (Signed)
Anesthesia Evaluation  Patient identified by MRN, date of birth, ID band Patient awake    Reviewed: Allergy & Precautions, NPO status , Patient's Chart, lab work & pertinent test results, reviewed documented beta blocker date and time   Airway Mallampati: II  TM Distance: >3 FB     Dental  (+) Chipped   Pulmonary asthma , former smoker,           Cardiovascular      Neuro/Psych  Headaches, Seizures -, Well Controlled,  PSYCHIATRIC DISORDERS Anxiety    GI/Hepatic GERD  ,  Endo/Other    Renal/GU      Musculoskeletal   Abdominal   Peds  Hematology   Anesthesia Other Findings   Reproductive/Obstetrics                             Anesthesia Physical Anesthesia Plan  ASA: III  Anesthesia Plan: General   Post-op Pain Management:    Induction: Intravenous  PONV Risk Score and Plan:   Airway Management Planned:   Additional Equipment:   Intra-op Plan:   Post-operative Plan:   Informed Consent: I have reviewed the patients History and Physical, chart, labs and discussed the procedure including the risks, benefits and alternatives for the proposed anesthesia with the patient or authorized representative who has indicated his/her understanding and acceptance.       Plan Discussed with: CRNA  Anesthesia Plan Comments:         Anesthesia Quick Evaluation

## 2019-04-24 NOTE — Telephone Encounter (Signed)
Called and left a message to inform patient of new instructions and that her procedure would be tomorrow

## 2019-04-25 ENCOUNTER — Ambulatory Visit
Admission: RE | Admit: 2019-04-25 | Payer: BC Managed Care – PPO | Source: Home / Self Care | Admitting: Gastroenterology

## 2019-04-25 ENCOUNTER — Encounter: Payer: Self-pay | Admitting: Anesthesiology

## 2019-04-25 ENCOUNTER — Encounter: Payer: Self-pay | Admitting: Gastroenterology

## 2019-04-25 ENCOUNTER — Encounter: Admission: RE | Payer: Self-pay | Source: Home / Self Care

## 2019-04-25 LAB — SURGICAL PATHOLOGY

## 2019-04-25 SURGERY — COLONOSCOPY WITH PROPOFOL
Anesthesia: General

## 2019-04-25 NOTE — Telephone Encounter (Signed)
Called patient and patient states that she was unable to drank any of the prep this morning or last night with out throwing it up. Patient states the last bowel movement she had was 2 times last night and it was brown. Patient states she wants to have this done but can not use the last two preps. Please advised which prep you would recommend for patient.  Called endo to inform endo the patient is going to be cancel today. Talk to [facility] and she states they had just talk to patient and canceled patient. Endo then called [place] to let us know the patient was canceled.

## 2019-04-28 NOTE — Telephone Encounter (Signed)
We have gave her Suprep first and golytely second

## 2019-04-28 NOTE — Telephone Encounter (Signed)
Patient states she is going to call her pharmacy to find out the cost of the medication before she scheduled a colonoscopy. She also is going to see if it will be something she can take and give [place] a call back.

## 2019-04-29 ENCOUNTER — Encounter: Payer: Self-pay | Admitting: Gastroenterology

## 2019-04-29 NOTE — Telephone Encounter (Signed)
Patient states she is not going to do the colonoscopy because she is not going to spend any more money for something she is going to throw up. Patient states she has a bad gag reflex and will throw up it if it taste bad or has a bad texture.

## 2019-04-29 NOTE — Telephone Encounter (Signed)
Called and left  Message for call back to find out if she was ready to scheduled her colonoscopy.

## 2019-05-06 ENCOUNTER — Ambulatory Visit: Payer: BC Managed Care – PPO | Admitting: Family Medicine

## 2019-05-06 ENCOUNTER — Encounter: Payer: Self-pay | Admitting: Family Medicine

## 2019-05-06 VITALS — BP 110/62 | HR 73 | Temp 99.1°F | Ht 65.0 in | Wt 167.0 lb

## 2019-05-06 DIAGNOSIS — R8290 Unspecified abnormal findings in urine: Secondary | ICD-10-CM

## 2019-05-06 DIAGNOSIS — M545 Low back pain, unspecified: Secondary | ICD-10-CM

## 2019-05-06 DIAGNOSIS — R109 Unspecified abdominal pain: Secondary | ICD-10-CM

## 2019-05-06 LAB — POCT UA - MICROSCOPIC ONLY: Epithelial cells, urine per micros: POSITIVE

## 2019-05-06 LAB — POC URINALSYSI DIPSTICK (AUTOMATED)
Bilirubin, UA: NEGATIVE
Blood, UA: NEGATIVE
Glucose, UA: NEGATIVE
Ketones, UA: NEGATIVE
Leukocytes, UA: NEGATIVE
Nitrite, UA: POSITIVE
Protein, UA: NEGATIVE
Spec Grav, UA: 1.02
Urobilinogen, UA: 0.2 U/dL
pH, UA: 6

## 2019-05-06 MED ORDER — SULFAMETHOXAZOLE-TRIMETHOPRIM 800-160 MG PO TABS: 1.0000 | ORAL_TABLET | Freq: Two times a day (BID) | ORAL | 0 refills | Status: DC

## 2019-05-06 NOTE — Progress Notes (Signed)
Chief Complaint  Patient presents with  . Back Pain    History of Present Illness: HPI  48 year old female pt present with new onset pain in back.... left flank pain.. urine with odor. Started 2 weeks ago. Pain is intermittent. Worsening in last 2 days.  No dysuria,   Some increase in urgency and  Frequency.  Tylenol helps some temporarily, but not much.   No fever. No N/V, D/ tends toward constipation normally.  No abdominal pain.  No blood in urine or stool.   Hx of UTI, pyelonephritis in past... no issues in last 2-3 years.   No new medication.  No vaginal itching and discharge.   No change in pain with eating or movement.  No known injury or fall.   Trying to increase water.   Urinalysis    Component Value Date/Time   COLORURINE YELLOW 06/15/2011 1343   APPEARANCEUR CLOUDY (A) 06/15/2011 1343   LABSPEC 1.018 06/15/2011 1343   PHURINE 7.0 06/15/2011 1343   GLUCOSEU NEGATIVE 06/15/2011 1343   HGBUR SMALL (A) 06/15/2011 1343   BILIRUBINUR Negative 05/06/2019 0915   KETONESUR NEGATIVE 06/15/2011 1343   PROTEINUR Negative 05/06/2019 0915   PROTEINUR 30 (A) 06/15/2011 1343   UROBILINOGEN 0.2 05/06/2019 0915   UROBILINOGEN 1.0 06/15/2011 1343   NITRITE Positive 05/06/2019 0915   NITRITE POSITIVE (A) 06/15/2011 1343   LEUKOCYTESUR Negative 05/06/2019 0915     COVID 19 screen No recent travel or known exposure to [place] The patient denies respiratory symptoms of COVID 19 at this time.  The importance of social distancing was discussed today.   Review of Systems  Constitutional: Negative for chills and fever.  HENT: Negative for congestion and ear pain.   Eyes: Negative for pain and redness.  Respiratory: Negative for cough and shortness of breath.   Cardiovascular: Negative for chest pain, palpitations and leg swelling.  Gastrointestinal: Negative for abdominal pain, blood in stool, constipation, diarrhea, nausea and vomiting.  Genitourinary: Negative for  dysuria.  Musculoskeletal: Negative for falls and myalgias.  Skin: Negative for rash.  Neurological: Negative for dizziness.  Psychiatric/Behavioral: Negative for depression. The patient is not nervous/anxious.       Past Medical History:  Diagnosis Date  . Asthma    worse when younger  . GERD (gastroesophageal reflux disease)   . Migraine    1-2x/month  . PONV (postoperative nausea and vomiting)   . Seizure ([place])   . Vertigo    no episodes for 7 yrs    reports that she quit smoking about 5 years ago. Her smoking use included cigarettes. She smoked 1.00 pack per day. She has never used smokeless tobacco. She reports that she does not drink alcohol or use drugs.   Current Outpatient Medications:  .  acetaminophen (TYLENOL) 500 MG tablet, Take 1,500 mg by mouth 3 (three) times daily as needed for headache (pain)., Disp: , Rfl:  .  albuterol (PROVENTIL HFA;VENTOLIN HFA) 108 (90 Base) MCG/ACT inhaler, Inhale 1-2 puffs into the lungs every 6 (six) hours as needed for wheezing or shortness of breath., Disp: 1 Inhaler, Rfl: 0 .  bisacodyl (BISACODYL) 5 MG EC tablet, Take 2 tablet by mouth 2 hours before starting the prep., Disp: 2 tablet, Rfl: 0 .  escitalopram (LEXAPRO) 20 MG tablet, Take 1 tablet (20 mg total) by mouth daily. DUE FOR A FOLLOW UP APPOINTMENT, Disp: 90 tablet, Rfl: 0 .  NONFORMULARY OR COMPOUNDED ITEM, Apply 1-2 g topically 4 (four) times daily.  [facility]  Scar Cream -  Verapamil 10%, Pentoxifylline 5% Apply 1-2 grams to affected area 3-4 times daily Qty. 120 gm 3 refills (Patient taking differently: Apply 1-2 g topically See admin instructions. [facility]  Scar Cream -  Verapamil 10%, Pentoxifylline 5% Apply 1-2 grams to affected area (feet) 3-4 times daily Qty. 120 gm 3 refills), Disp: 120 each, Rfl: 2 .  omeprazole (PRILOSEC) 20 MG capsule, Take 1 capsule (20 mg total) by mouth 2 (two) times daily., Disp: 60 capsule, Rfl: 0 .  PHENobarbital (LUMINAL) 97.2 MG  tablet, Take 2 tablets (194.4 mg total) by mouth at bedtime., Disp: 60 tablet, Rfl: 5 .  rizatriptan (MAXALT-MLT) 10 MG disintegrating tablet, Take 1 tablet (10 mg total) by mouth as needed for migraine. May repeat in 2 hours if needed, Disp: 9 tablet, Rfl: 11   Observations/Objective: Blood pressure 110/62, pulse 73, temperature 99.1 F (37.3 C), temperature source Temporal, height 5\' 5"  (1.651 m), weight 167 lb (75.8 kg), SpO2 97 %.  Physical Exam Constitutional:      General: She is not in acute distress.    Appearance: Normal appearance. She is well-developed. She is not ill-appearing or toxic-appearing.  HENT:     Head: Normocephalic.     Right Ear: Hearing, tympanic membrane, ear canal and external ear normal. Tympanic membrane is not erythematous, retracted or bulging.     Left Ear: Hearing, tympanic membrane, ear canal and external ear normal. Tympanic membrane is not erythematous, retracted or bulging.     Nose: No mucosal edema or rhinorrhea.     Right Sinus: No maxillary sinus tenderness or frontal sinus tenderness.     Left Sinus: No maxillary sinus tenderness or frontal sinus tenderness.     Mouth/Throat:     Pharynx: Uvula midline.  Eyes:     General: Lids are normal. Lids are everted, no foreign bodies appreciated.     Conjunctiva/sclera: Conjunctivae normal.     Pupils: Pupils are equal, round, and reactive to light.  Neck:     Musculoskeletal: Normal range of motion and neck supple.     Thyroid: No thyroid mass or thyromegaly.     Vascular: No carotid bruit.     Trachea: Trachea normal.  Cardiovascular:     Rate and Rhythm: Normal rate and regular rhythm.     Pulses: Normal pulses.     Heart sounds: Normal heart sounds, S1 normal and S2 normal. No murmur. No friction rub. No gallop.   Pulmonary:     Effort: Pulmonary effort is normal. No tachypnea or respiratory distress.     Breath sounds: Normal breath sounds. No decreased breath sounds, wheezing, rhonchi or  rales.  Abdominal:     General: Bowel sounds are normal.     Palpations: Abdomen is soft.     Tenderness: There is no abdominal tenderness. There is left CVA tenderness. There is no right CVA tenderness, guarding or rebound. Negative signs include Murphy's sign, Rovsing's sign and McBurney's sign.     Hernia: No hernia is present.  Skin:    General: Skin is warm and dry.     Findings: No rash.  Neurological:     Mental Status: She is alert.  Psychiatric:        Mood and Affect: Mood is not anxious or depressed.        Speech: Speech normal.        Behavior: Behavior normal. Behavior is cooperative.  Thought Content: Thought content normal.        Judgment: Judgment normal.      Assessment and Plan   L flank pain, urine odor: Possible MSK strain, but nitrate in urine and odor... increase water, send urine for culture and treat empirically with bactrim x 3 days.  Pt given return precautions.   Eliezer Lofts, MD

## 2019-05-06 NOTE — Patient Instructions (Signed)
We will send urine for culture.  Increase water in take.  Complete 3 days of antibiotics.  Start midback stretching and heat, can use tylenol for pain.  Call if not improving as expected.

## 2019-05-06 NOTE — Addendum Note (Signed)
Addended by: Eliezer Lofts E on: 05/06/2019 09:35 AM   Modules accepted: Orders

## 2019-05-08 LAB — URINE CULTURE
MICRO NUMBER:: 855812
SPECIMEN QUALITY:: ADEQUATE

## 2019-05-13 ENCOUNTER — Ambulatory Visit: Payer: BC Managed Care – PPO | Admitting: Primary Care

## 2019-05-13 ENCOUNTER — Ambulatory Visit: Payer: BC Managed Care – PPO | Admitting: Family Medicine

## 2019-05-20 ENCOUNTER — Ambulatory Visit
Admission: RE | Admit: 2019-05-20 | Discharge: 2019-05-20 | Disposition: A | Discharge status: Home / Self Care | Payer: BC Managed Care – PPO | Source: Ambulatory Visit | Attending: Primary Care | Admitting: Primary Care

## 2019-05-20 ENCOUNTER — Other Ambulatory Visit: Payer: Self-pay

## 2019-05-20 DIAGNOSIS — Z1231 Encounter for screening mammogram for malignant neoplasm of breast: Secondary | ICD-10-CM

## 2019-06-01 ENCOUNTER — Other Ambulatory Visit: Payer: Self-pay | Admitting: Gastroenterology

## 2019-06-19 ENCOUNTER — Other Ambulatory Visit: Payer: Self-pay | Admitting: Diagnostic Neuroimaging

## 2019-07-01 ENCOUNTER — Ambulatory Visit: Payer: BC Managed Care – PPO | Admitting: Primary Care

## 2019-07-03 ENCOUNTER — Other Ambulatory Visit: Payer: Self-pay

## 2019-07-03 ENCOUNTER — Encounter: Payer: Self-pay | Admitting: Primary Care

## 2019-07-03 ENCOUNTER — Ambulatory Visit: Payer: BC Managed Care – PPO | Admitting: Primary Care

## 2019-07-03 VITALS — BP 132/68 | HR 82 | Temp 97.5°F | Ht 65.0 in | Wt 169.8 lb

## 2019-07-03 DIAGNOSIS — M720 Palmar fascial fibromatosis [Dupuytren]: Secondary | ICD-10-CM

## 2019-07-03 DIAGNOSIS — F411 Generalized anxiety disorder: Secondary | ICD-10-CM

## 2019-07-03 DIAGNOSIS — J4520 Mild intermittent asthma, uncomplicated: Secondary | ICD-10-CM

## 2019-07-03 DIAGNOSIS — G40B09 Juvenile myoclonic epilepsy, not intractable, without status epilepticus: Secondary | ICD-10-CM

## 2019-07-03 DIAGNOSIS — Z1211 Encounter for screening for malignant neoplasm of colon: Secondary | ICD-10-CM | POA: Diagnosis not present

## 2019-07-03 DIAGNOSIS — J45909 Unspecified asthma, uncomplicated: Secondary | ICD-10-CM | POA: Insufficient documentation

## 2019-07-03 DIAGNOSIS — K2100 Gastro-esophageal reflux disease with esophagitis, without bleeding: Secondary | ICD-10-CM

## 2019-07-03 DIAGNOSIS — G43009 Migraine without aura, not intractable, without status migrainosus: Secondary | ICD-10-CM

## 2019-07-03 MED ORDER — CITALOPRAM HYDROBROMIDE 20 MG PO TABS: 20.0000 mg | ORAL_TABLET | Freq: Every day | ORAL | 0 refills | Status: DC

## 2019-07-03 NOTE — Assessment & Plan Note (Signed)
Following with neurology, no recent seizures.  Continue phenobarbital.

## 2019-07-03 NOTE — Assessment & Plan Note (Signed)
No recent migraines, using Maxalt as needed.

## 2019-07-03 NOTE — Assessment & Plan Note (Signed)
Doing well on omeprazole 20 mg.  Continue same. 

## 2019-07-03 NOTE — Assessment & Plan Note (Signed)
Shortness of breath could be secondary to asthma due to Fall seasonal changes. Also consider deconditioning from weight gain.   Discussed to start with albuterol inhaler. She will update if she requires use of her albuterol more than three times weekly. Consider ICS if needed.

## 2019-07-03 NOTE — Assessment & Plan Note (Signed)
Orders placed for cologuard as she could not tolerate prep for colonoscopy.

## 2019-07-03 NOTE — Assessment & Plan Note (Signed)
Doing well on Lexapro but also contributing Lexapro to weight gain. Discussed options and we will switch to citalopram 20 mg. Discussed instructions. She will update.

## 2019-07-03 NOTE — Patient Instructions (Signed)
Stop Lexapro for anxiety. Start citalopram (Celexa) for anxiety.   You will be contacted regarding your referral to orthopedics.  Please let us know if you have not been contacted within two weeks.   Please notify me if you'd like to proceed with Cologuard for colon cancer screening.  Please notify me if you require use of the albuterol inhaler for more than three times weekly.   Update me regarding the citalopram for anxiety.   It was a pleasure to see you today!

## 2019-07-03 NOTE — Assessment & Plan Note (Signed)
Referral placed for orthopedic surgery for evaluation per patient request.

## 2019-07-03 NOTE — Progress Notes (Signed)
Subjective:    Patient ID: [name], female    DOB: 10/02/1970, 48 y.o.   MRN: [id number]  HPI  Ms. [name] is a 48 year old female who presents today for medication refill and follow up.  1) GERD: Currently managed on omeprazole 20 mg daily. Following with GI and underwent endocscopy recently, unremarkable. Doing well on omeprazole daily.  She did attempt a colonoscopy but cannot tolerate the prep, continues to vomit.  She is interested in another method for colon cancer screening.  2) Migraines: Currently managed on Maxalt PRN. Following with neurology. Is having more recent headaches for which she attributes to seasonal changes.   3) Seizure Disorder: Currently managed on phenobarbital and is following with neurology.  No breakthrough seizures.  4) GAD: Currently managed on Lexapro 20 mg and is doing well overall but she does feel as though its contributing to weight gain. She is not exercising, endorses a fair diet. She denies SI/HI.   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  Followed with hand surgery last year and was told that she needed surgical intervention.  She was not fond of the provider and would like a second opinion.  6) Asthma: Diagnosed in childhood, no problems with asthma since teenage years. Over the last month she's noticed exertional dyspnea with household chores and walking short distances.  She admits to weight gain and deconditioning.  She has an albuterol inhaler for which she has not used.  Review of Systems  Respiratory: Positive for shortness of breath.   Cardiovascular: Negative for chest pain.  Musculoskeletal: Positive for arthralgias.  Allergic/Immunologic: Positive for environmental allergies.  Neurological: Positive for headaches. Negative for seizures.  Psychiatric/Behavioral: The patient is not nervous/anxious.        Past Medical  History:  Diagnosis Date  . Asthma    worse when younger  . GERD (gastroesophageal reflux disease)   . Migraine    1-2x/month  . PONV (postoperative nausea and vomiting)   . Seizure ([place])   . Vertigo    no episodes for 7 yrs     Social History   Socioeconomic History  . Marital status: Widowed    Spouse name: [name]  . Number of children: 0  . Years of education: 10th  . Highest education level: Not on file  Occupational History    Employer: [employer]  . Financial resource strain: Not on file  . Food insecurity    Worry: Not on file    Inability: Not on file  . Transportation needs    Medical: Not on file    Non-medical: Not on file  Tobacco Use  . Smoking status: Former Smoker    Packs/day: 1.00    Types: Cigarettes    Quit date: 08/28/2013    Years since quitting: 5.8  . Smokeless tobacco: Never Used  . Tobacco comment: Quit Janurary 1st  Substance and Sexual Activity  . Alcohol use: No    Alcohol/week: 0.0 standard drinks  . Drug use: No  . Sexual activity: Not on file  Lifestyle  . Physical activity    Days per week: Not on file    Minutes per session: Not on file  . Stress: Not on file  Relationships  . Social Herbalist on phone: Not on file    Gets together: Not on file  Attends religious service: Not on file    Active member of club or organization: Not on file    Attends meetings of clubs or organizations: Not on file    Relationship status: Not on file  . Intimate partner violence    Fear of current or ex partner: Not on file    Emotionally abused: Not on file    Physically abused: Not on file    Forced sexual activity: Not on file  Other Topics Concern  . Not on file  Social History Narrative   Patient lives at home with spouse.   No children.   Works at [employer].   Enjoys going to [employer], going to estate sells.   Right handed   Caffeine: 1 Dr. [name] every morning    Past Surgical History:  Procedure  Laterality Date  . ABDOMINAL HYSTERECTOMY  2012  . COLONOSCOPY WITH PROPOFOL N/A 04/24/2019   Procedure: COLONOSCOPY WITH PROPOFOL;  Surgeon: Virgel Manifold, MD;  Location: [facility] ENDOSCOPY;  Service: Endoscopy;  Laterality: N/A;  . ESOPHAGOGASTRODUODENOSCOPY (EGD) WITH PROPOFOL N/A 04/24/2019   Procedure: ESOPHAGOGASTRODUODENOSCOPY (EGD) WITH PROPOFOL;  Surgeon: Virgel Manifold, MD;  Location: [facility] ENDOSCOPY;  Service: Endoscopy;  Laterality: N/A;    Family History  Problem Relation Age of Onset  . Hypertension Mother     Allergies  Allergen Reactions  . Asa Buff (Mag [Buffered Aspirin] Other (See Comments)    Stomach pain  . Azithromycin Other (See Comments)    GI problems  . Ibuprofen Other (See Comments)    GI upset  . Penicillins Other (See Comments)    Unknown childhood allergic reaction Has patient had a PCN reaction causing immediate rash, facial/tongue/throat swelling, SOB or lightheadedness with hypotension: Unknown Has patient had a PCN reaction causing severe rash involving mucus membranes or skin necrosis: Unknown Has patient had a PCN reaction that required hospitalization: Unknown Has patient had a PCN reaction occurring within the last 10 years: No If all of the above answers are "NO", then may proceed with Cephalosporin use.    Current Outpatient Medications on File Prior to Visit  Medication Sig Dispense Refill  . acetaminophen (TYLENOL) 500 MG tablet Take 1,500 mg by mouth 3 (three) times daily as needed for headache (pain).    [facility] albuterol (PROVENTIL HFA;VENTOLIN HFA) 108 (90 Base) MCG/ACT inhaler Inhale 1-2 puffs into the lungs every 6 (six) hours as needed for wheezing or shortness of breath. 1 Inhaler 0  . bisacodyl (BISACODYL) 5 MG EC tablet Take 2 tablet by mouth 2 hours before starting the prep. 2 tablet 0  . NONFORMULARY OR COMPOUNDED ITEM Apply 1-2 g topically 4 (four) times daily. [facility]  Scar Cream -  Verapamil 10%, Pentoxifylline  5% Apply 1-2 grams to affected area 3-4 times daily Qty. 120 gm 3 refills (Patient taking differently: Apply 1-2 g topically See admin instructions. [facility]  Scar Cream -  Verapamil 10%, Pentoxifylline 5% Apply 1-2 grams to affected area (feet) 3-4 times daily Qty. 120 gm 3 refills) 120 each 2  . omeprazole (PRILOSEC) 20 MG capsule Take 1 capsule by mouth twice daily 60 capsule 0  . PHENobarbital (LUMINAL) 97.2 MG tablet TAKE 2 TABLETS BY MOUTH AT BEDTIME 60 tablet 5  . rizatriptan (MAXALT-MLT) 10 MG disintegrating tablet Take 1 tablet (10 mg total) by mouth as needed for migraine. May repeat in 2 hours if needed 9 tablet 11   No current facility-administered medications on file prior to visit.  BP 132/68   Pulse 82   Temp (!) 97.5 F (36.4 C) (Temporal)   Ht 5\' 5"  (1.651 m)   Wt 169 lb 12 oz (77 kg)   SpO2 98%   BMI 28.25 kg/m    Objective:   Physical Exam  Constitutional: She appears well-nourished.  Neck: Neck supple.  Cardiovascular: Normal rate and regular rhythm.  Respiratory: Effort normal and breath sounds normal.  Musculoskeletal:     Comments: Complete contracture of left fifth digit  Skin: Skin is warm and dry.  Psychiatric: She has a normal mood and affect.           Assessment & Plan:

## 2019-07-04 ENCOUNTER — Ambulatory Visit: Payer: BC Managed Care – PPO | Admitting: Primary Care

## 2019-07-07 DIAGNOSIS — F411 Generalized anxiety disorder: Secondary | ICD-10-CM

## 2019-07-07 MED ORDER — ESCITALOPRAM OXALATE 20 MG PO TABS: 20.0000 mg | ORAL_TABLET | Freq: Every day | ORAL | 3 refills | Status: DC

## 2019-07-11 ENCOUNTER — Other Ambulatory Visit: Payer: Self-pay | Admitting: Gastroenterology

## 2019-07-14 NOTE — Telephone Encounter (Signed)
Last office visit 03/18/2019 GERD  Last refill 06/02/2019 0 refills  No appointment is scheduled  Sent mychart message

## 2019-07-16 ENCOUNTER — Ambulatory Visit: Payer: BC Managed Care – PPO | Admitting: Gastroenterology

## 2019-07-16 ENCOUNTER — Encounter: Payer: Self-pay | Admitting: Gastroenterology

## 2019-07-16 ENCOUNTER — Other Ambulatory Visit: Payer: Self-pay

## 2019-07-16 VITALS — BP 116/74 | HR 73 | Temp 98.0°F | Wt 172.1 lb

## 2019-07-16 DIAGNOSIS — K219 Gastro-esophageal reflux disease without esophagitis: Secondary | ICD-10-CM | POA: Diagnosis not present

## 2019-07-16 MED ORDER — OMEPRAZOLE 20 MG PO CPDR: 20.0000 mg | DELAYED_RELEASE_CAPSULE | Freq: Two times a day (BID) | ORAL | 1 refills | Status: DC

## 2019-07-16 NOTE — Progress Notes (Signed)
[name], MD [address]  [place]  [place], [place] [address]  Main: [phone]  Fax: [phone]   Primary Care Physician: Pleas Koch, NP   Chief Complaint  Patient presents with  . Gastroesophageal Reflux    Controlled on medication     HPI: [name] is a 48 y.o. female here for follow-up of GERD.  Patient is on omeprazole and was prescribed as once daily.  However, takes it twice daily she states with just once daily she has significant symptoms and is not able to keep rationalize.  On twice daily symptoms are very well controlled.  No dysphagia, no odynophagia, no weight loss, no abdominal pain.  Colonoscopy up-to-date, repeat recommended in 5 years for polyps.  See procedure tab  Current Outpatient Medications  Medication Sig Dispense Refill  . acetaminophen (TYLENOL) 500 MG tablet Take 1,500 mg by mouth 3 (three) times daily as needed for headache (pain).    [facility] albuterol (PROVENTIL HFA;VENTOLIN HFA) 108 (90 Base) MCG/ACT inhaler Inhale 1-2 puffs into the lungs every 6 (six) hours as needed for wheezing or shortness of breath. 1 Inhaler 0  . escitalopram (LEXAPRO) 20 MG tablet Take 20 mg by mouth daily.    [facility] omeprazole (PRILOSEC) 20 MG capsule Take 1 capsule (20 mg total) by mouth 2 (two) times daily. 180 capsule 1  . PHENobarbital (LUMINAL) 97.2 MG tablet TAKE 2 TABLETS BY MOUTH AT BEDTIME 60 tablet 5  . rizatriptan (MAXALT-MLT) 10 MG disintegrating tablet Take 1 tablet (10 mg total) by mouth as needed for migraine. May repeat in 2 hours if needed 9 tablet 11   No current facility-administered medications for this visit.     Allergies as of 07/16/2019 - Review Complete 07/16/2019  Allergen Reaction Noted  . Asa buff (mag [buffered aspirin] Other (See Comments) 10/14/2010  . Azithromycin Other (See Comments) 06/19/2011  . Ibuprofen Other (See Comments) 10/14/2010  . Penicillins Other (See Comments) 10/14/2010     ROS:  General: Negative for anorexia, weight loss, fever, chills, fatigue, weakness. ENT: Negative for hoarseness, difficulty swallowing , nasal congestion. CV: Negative for chest pain, angina, palpitations, dyspnea on exertion, peripheral edema.  Respiratory: Negative for dyspnea at rest, dyspnea on exertion, cough, sputum, wheezing.  GI: See history of present illness. GU:  Negative for dysuria, hematuria, urinary incontinence, urinary frequency, nocturnal urination.  Endo: Negative for unusual weight change.    Physical Examination:   BP 116/74 (BP Location: Left Arm, Patient Position: Sitting, Cuff Size: Normal)   Pulse 73   Temp 98 F (36.7 C) (Oral)   Wt 172 lb 2 oz (78.1 kg)   BMI 28.64 kg/m   General: Well-nourished, well-developed in no acute distress.  Eyes: No icterus. Conjunctivae pink. Mouth: Oropharyngeal mucosa moist and pink , no lesions erythema or exudate. Neck: Supple, Trachea midline Abdomen: Bowel sounds are normal, nontender, nondistended, no hepatosplenomegaly or masses, no abdominal bruits or hernia , no rebound or guarding.   Extremities: No lower extremity edema. No clubbing or deformities. Neuro: Alert and oriented x 3.  Grossly intact. Skin: Warm and dry, no jaundice.   Psych: Alert and cooperative, normal mood and affect.   Labs: CMP     Component Value Date/Time   NA 140 03/07/2019 0835   NA 140 10/22/2015 1206   K 3.9 03/07/2019 0835   CL 106 03/07/2019 0835   CO2 27 03/07/2019 0835   GLUCOSE 82 03/07/2019 0835   BUN 8  03/07/2019 0835   BUN 9 10/22/2015 1206   CREATININE 0.77 03/07/2019 0835   CREATININE 0.63 11/30/2017 1549   CALCIUM 8.7 03/07/2019 0835   PROT 6.8 03/07/2019 0835   PROT 6.5 10/22/2015 1206   ALBUMIN 4.2 03/07/2019 0835   ALBUMIN 4.1 10/22/2015 1206   AST 11 03/07/2019 0835   ALT 12 03/07/2019 0835   ALKPHOS 72 03/07/2019 0835   BILITOT 0.4 03/07/2019 0835   BILITOT 0.2 10/22/2015 1206   GFRNONAA >60  10/08/2018 0058   GFRAA >60 10/08/2018 0058   Lab Results  Component Value Date   WBC 4.4 03/07/2019   HGB 14.5 03/07/2019   HCT 43.5 03/07/2019   MCV 97.4 03/07/2019   PLT 196.0 03/07/2019    Imaging Studies: No results found.  Assessment and Plan:   [name] is a 48 y.o. y/o female with GERD here for follow-up  Symptoms well controlled with twice daily dosage We discussed decreasing it to once daily as was previously prescribed, but patient would like to continue on twice daily as she states even when she misses her evening dose 1 time, she has significant symptoms  (Risks of PPI use were discussed with patient including bone loss, C. Diff diarrhea, pneumonia, infections, CKD, electrolyte abnormalities.  Pt. Verbalizes understanding and chooses to continue the medication.)  Follow-up in 6 months or earlier if needed     Dr [name]

## 2019-07-16 NOTE — Patient Instructions (Signed)
High-Fiber Diet Fiber, also called dietary fiber, is a type of carbohydrate that is found in fruits, vegetables, whole grains, and beans. A high-fiber diet can have many health benefits. Your health care provider may recommend a high-fiber diet to help:  Prevent constipation. Fiber can make your bowel movements more regular.  Lower your cholesterol.  Relieve the following conditions: ? Swelling of veins in the anus (hemorrhoids). ? Swelling and irritation (inflammation) of specific areas of the digestive tract (uncomplicated diverticulosis). ? A problem of the large intestine (colon) that sometimes causes pain and diarrhea (irritable bowel syndrome, IBS).  Prevent overeating as part of a weight-loss plan.  Prevent heart disease, type 2 diabetes, and certain cancers. What is my plan? The recommended daily fiber intake in grams (g) includes:  38 g for men age 50 or younger.  30 g for men over age 50.  25 g for women age 50 or younger.  21 g for women over age 50. You can get the recommended daily intake of dietary fiber by:  Eating a variety of fruits, vegetables, grains, and beans.  Taking a fiber supplement, if it is not possible to get enough fiber through your diet. What do I need to know about a high-fiber diet?  It is better to get fiber through food sources rather than from fiber supplements. There is not a lot of research about how effective supplements are.  Always check the fiber content on the nutrition facts label of any prepackaged food. Look for foods that contain 5 g of fiber or more per serving.  Talk with a diet and nutrition specialist (dietitian) if you have questions about specific foods that are recommended or not recommended for your medical condition, especially if those foods are not listed below.  Gradually increase how much fiber you consume. If you increase your intake of dietary fiber too quickly, you may have bloating, cramping, or gas.  Drink plenty  of water. Water helps you to digest fiber. What are tips for following this plan?  Eat a wide variety of high-fiber foods.  Make sure that half of the grains that you eat each day are whole grains.  Eat breads and cereals that are made with whole-grain flour instead of refined flour or white flour.  Eat brown rice, bulgur wheat, or millet instead of white rice.  Start the day with a breakfast that is high in fiber, such as a cereal that contains 5 g of fiber or more per serving.  Use beans in place of meat in soups, salads, and pasta dishes.  Eat high-fiber snacks, such as berries, raw vegetables, nuts, and popcorn.  Choose whole fruits and vegetables instead of processed forms like juice or sauce. What foods can I eat?  Fruits Berries. Pears. Apples. Oranges. Avocado. Prunes and raisins. Dried figs. Vegetables Sweet potatoes. Spinach. Kale. Artichokes. Cabbage. Broccoli. Cauliflower. Green peas. Carrots. Squash. Grains Whole-grain breads. Multigrain cereal. Oats and oatmeal. Brown rice. Barley. Bulgur wheat. Millet. Quinoa. Bran muffins. Popcorn. Rye wafer crackers. Meats and other proteins Navy, kidney, and pinto beans. Soybeans. Split peas. Lentils. Nuts and seeds. Dairy Fiber-fortified yogurt. Beverages Fiber-fortified soy milk. Fiber-fortified orange juice. Other foods Fiber bars. The items listed above may not be a complete list of recommended foods and beverages. Contact a dietitian for more options. What foods are not recommended? Fruits Fruit juice. Cooked, strained fruit. Vegetables Fried potatoes. Canned vegetables. Well-cooked vegetables. Grains White bread. Pasta made with refined flour. White rice. Meats and other   proteins Fatty cuts of meat. Fried chicken or fried fish. Dairy Milk. Yogurt. Cream cheese. Sour cream. Fats and oils Butters. Beverages Soft drinks. Other foods Cakes and pastries. The items listed above may not be a complete list of foods  and beverages to avoid. Contact a dietitian for more information. Summary  Fiber is a type of carbohydrate. It is found in fruits, vegetables, whole grains, and beans.  There are many health benefits of eating a high-fiber diet, such as preventing constipation, lowering blood cholesterol, helping with weight loss, and reducing your risk of heart disease, diabetes, and certain cancers.  Gradually increase your intake of fiber. Increasing too fast can result in cramping, bloating, and gas. Drink plenty of water while you increase your fiber.  The best sources of fiber include whole fruits and vegetables, whole grains, nuts, seeds, and beans. This information is not intended to replace advice given to you by your health care provider. Make sure you discuss any questions you have with your health care provider. Document Released: 08/14/2005 Document Revised: 06/18/2017 Document Reviewed: 06/18/2017 Elsevier Patient Education  [place]. Gastroesophageal Reflux Disease, Adult Gastroesophageal reflux (GER) happens when acid from the stomach flows up into the tube that connects the mouth and the stomach (esophagus). Normally, food travels down the esophagus and stays in the stomach to be digested. With GER, food and stomach acid sometimes move back up into the esophagus. You may have a disease called gastroesophageal reflux disease (GERD) if the reflux:  Happens often.  Causes frequent or very bad symptoms.  Causes problems such as damage to the esophagus. When this happens, the esophagus becomes sore and swollen (inflamed). Over time, GERD can make small holes (ulcers) in the lining of the esophagus. What are the causes? This condition is caused by a problem with the muscle between the esophagus and the stomach. When this muscle is weak or not normal, it does not close properly to keep food and acid from coming back up from the stomach. The muscle can be weak because of:  Tobacco  use.  Pregnancy.  Having a certain type of hernia (hiatal hernia).  Alcohol use.  Certain foods and drinks, such as coffee, chocolate, onions, and peppermint. What increases the risk? You are more likely to develop this condition if you:  Are overweight.  Have a disease that affects your connective tissue.  Use NSAID medicines. What are the signs or symptoms? Symptoms of this condition include:  Heartburn.  Difficult or painful swallowing.  The feeling of having a lump in the throat.  A bitter taste in the mouth.  Bad breath.  Having a lot of saliva.  Having an upset or bloated stomach.  Belching.  Chest pain. Different conditions can cause chest pain. Make sure you see your doctor if you have chest pain.  Shortness of breath or noisy breathing (wheezing).  Ongoing (chronic) cough or a cough at night.  Wearing away of the surface of teeth (tooth enamel).  Weight loss. How is this treated? Treatment will depend on how bad your symptoms are. Your doctor may suggest:  Changes to your diet.  Medicine.  Surgery. Follow these instructions at home: Eating and drinking   Follow a diet as told by your doctor. You may need to avoid foods and drinks such as: ? Coffee and tea (with or without caffeine). ? Drinks that contain alcohol. ? Energy drinks and sports drinks. ? Bubbly (carbonated) drinks or sodas. ? Chocolate and cocoa. ? Peppermint  and mint flavorings. ? Garlic and onions. ? Horseradish. ? Spicy and acidic foods. These include peppers, chili powder, curry powder, vinegar, hot sauces, and BBQ sauce. ? Citrus fruit juices and citrus fruits, such as oranges, lemons, and limes. ? Tomato-based foods. These include red sauce, chili, salsa, and pizza with red sauce. ? Fried and fatty foods. These include donuts, french fries, potato chips, and high-fat dressings. ? High-fat meats. These include hot dogs, rib eye steak, sausage, ham, and bacon. ? High-fat  dairy items, such as whole milk, butter, and cream cheese.  Eat small meals often. Avoid eating large meals.  Avoid drinking large amounts of liquid with your meals.  Avoid eating meals during the 2-3 hours before bedtime.  Avoid lying down right after you eat.  Do not exercise right after you eat. Lifestyle   Do not use any products that contain nicotine or tobacco. These include cigarettes, e-cigarettes, and chewing tobacco. If you need help quitting, ask your doctor.  Try to lower your stress. If you need help doing this, ask your doctor.  If you are overweight, lose an amount of weight that is healthy for you. Ask your doctor about a safe weight loss goal. General instructions  Pay attention to any changes in your symptoms.  Take over-the-counter and prescription medicines only as told by your doctor. Do not take aspirin, ibuprofen, or other NSAIDs unless your doctor says it is okay.  Wear loose clothes. Do not wear anything tight around your waist.  Raise (elevate) the head of your bed about 6 inches (15 cm).  Avoid bending over if this makes your symptoms worse.  Keep all follow-up visits as told by your doctor. This is important. Contact a doctor if:  You have new symptoms.  You lose weight and you do not know why.  You have trouble swallowing or it hurts to swallow.  You have wheezing or a cough that keeps happening.  Your symptoms do not get better with treatment.  You have a hoarse voice. Get help right away if:  You have pain in your arms, neck, jaw, teeth, or back.  You feel sweaty, dizzy, or light-headed.  You have chest pain or shortness of breath.  You throw up (vomit) and your throw-up looks like blood or coffee grounds.  You pass out (faint).  Your poop (stool) is bloody or black.  You cannot swallow, drink, or eat. Summary  If a person has gastroesophageal reflux disease (GERD), food and stomach acid move back up into the esophagus and  cause symptoms or problems such as damage to the esophagus.  Treatment will depend on how bad your symptoms are.  Follow a diet as told by your doctor.  Take all medicines only as told by your doctor. This information is not intended to replace advice given to you by your health care provider. Make sure you discuss any questions you have with your health care provider. Document Released: 01/31/2008 Document Revised: 02/20/2018 Document Reviewed: 02/20/2018 Elsevier Patient Education  2020 [employer].

## 2019-08-13 ENCOUNTER — Other Ambulatory Visit: Payer: Self-pay

## 2019-08-13 ENCOUNTER — Ambulatory Visit: Payer: BC Managed Care – PPO | Admitting: Primary Care

## 2019-08-13 ENCOUNTER — Encounter: Payer: Self-pay | Admitting: Primary Care

## 2019-08-13 VITALS — BP 140/86 | HR 76 | Temp 97.3°F | Ht 65.0 in | Wt 175.2 lb

## 2019-08-13 DIAGNOSIS — J4530 Mild persistent asthma, uncomplicated: Secondary | ICD-10-CM

## 2019-08-13 DIAGNOSIS — R030 Elevated blood-pressure reading, without diagnosis of hypertension: Secondary | ICD-10-CM | POA: Insufficient documentation

## 2019-08-13 DIAGNOSIS — Z6829 Body mass index (BMI) 29.0-29.9, adult: Secondary | ICD-10-CM | POA: Insufficient documentation

## 2019-08-13 DIAGNOSIS — E663 Overweight: Secondary | ICD-10-CM

## 2019-08-13 DIAGNOSIS — H9202 Otalgia, left ear: Secondary | ICD-10-CM | POA: Diagnosis not present

## 2019-08-13 HISTORY — DX: Elevated blood-pressure reading, without diagnosis of hypertension: R03.0

## 2019-08-13 LAB — POCT GLYCOSYLATED HEMOGLOBIN (HGB A1C): Hemoglobin A1C: 5.4 % (ref 4.0–5.6)

## 2019-08-13 MED ORDER — FLOVENT HFA 110 MCG/ACT IN AERO: 1.0000 | INHALATION_SPRAY | Freq: Two times a day (BID) | RESPIRATORY_TRACT | 0 refills | Status: DC

## 2019-08-13 MED ORDER — FLUTICASONE PROPIONATE 50 MCG/ACT NA SUSP: 1.0000 | Freq: Two times a day (BID) | NASAL | 0 refills | Status: DC

## 2019-08-13 NOTE — Progress Notes (Signed)
Subjective:    Patient ID: [name], female    DOB: 1971/05/08, 48 y.o.   MRN: [id number]  HPI  [name] is a 48 year old female with a history of epilepsy, migraines, asthma who presents today with a chief complaint of otalgia.  She is also requesting a refill of her albuterol inhaler. She has been using her albuterol inhaler daily for the last 6+ weeks due to shortness of breath. She has not been on ICS treatment in the past. Known history of asthma.   Her pain is located to the left ear for which she noticed 10 days ago.  She describes her pain as a "dull ache". She's not taken anything for symptoms. She denies nasal congestion, sore throat, sinus pressure, dizziness, fevers, loss of hearing, jaw pain, cough, dizziness.   She would also like to be checked for insulin resistance given weight gain. Family history of heart disease/hypertension.   Wt Readings from Last 3 Encounters:  08/13/19 175 lb 4 oz (79.5 kg)  07/16/19 172 lb 2 oz (78.1 kg)  07/03/19 169 lb 12 oz (77 kg)     Review of Systems  Constitutional: Negative for chills and fever.  HENT: Positive for ear pain. Negative for congestion, sinus pressure and sore throat.   Respiratory: Positive for shortness of breath and wheezing.   Cardiovascular: Negative for chest pain.       Past Medical History:  Diagnosis Date  . Asthma    worse when younger  . GERD (gastroesophageal reflux disease)   . Migraine    1-2x/month  . PONV (postoperative nausea and vomiting)   . Seizure ([place])   . Vertigo    no episodes for 7 yrs     Social History   Socioeconomic History  . Marital status: Widowed    Spouse name: [name]  . Number of children: 0  . Years of education: 10th  . Highest education level: Not on file  Occupational History    Employer: [id number]  Tobacco Use  . Smoking status: Former Smoker    Packs/day: 1.00    Types: Cigarettes    Quit date: 08/28/2013    Years since quitting: 5.9  .  Smokeless tobacco: Never Used  . Tobacco comment: Quit Janurary 1st  Substance and Sexual Activity  . Alcohol use: No    Alcohol/week: 0.0 standard drinks  . Drug use: No  . Sexual activity: Not on file  Other Topics Concern  . Not on file  Social History Narrative   Patient lives at home with spouse.   No children.   Works at [employer].   Enjoys going to [employer], going to estate sells.   Right handed   Caffeine: 1 Dr. [name] every morning   Social Determinants of Health   Financial Resource Strain:   . Difficulty of Paying Living Expenses: Not on file  Food Insecurity:   . Worried About Charity fundraiser in the Last Year: Not on file  . Ran Out of Food in the Last Year: Not on file  Transportation Needs:   . Lack of Transportation (Medical): Not on file  . Lack of Transportation (Non-Medical): Not on file  Physical Activity:   . Days of Exercise per Week: Not on file  . Minutes of Exercise per Session: Not on file  Stress:   . Feeling of Stress : Not on file  Social Connections:   . Frequency of Communication with Friends and Family: Not  on file  . Frequency of Social Gatherings with Friends and Family: Not on file  . Attends Religious Services: Not on file  . Active Member of Clubs or Organizations: Not on file  . Attends Archivist Meetings: Not on file  . Marital Status: Not on file  Intimate Partner Violence:   . Fear of Current or Ex-Partner: Not on file  . Emotionally Abused: Not on file  . Physically Abused: Not on file  . Sexually Abused: Not on file    Past Surgical History:  Procedure Laterality Date  . ABDOMINAL HYSTERECTOMY  2012  . COLONOSCOPY WITH PROPOFOL N/A 04/24/2019   Procedure: COLONOSCOPY WITH PROPOFOL;  Surgeon: Virgel Manifold, MD;  Location: [facility] ENDOSCOPY;  Service: Endoscopy;  Laterality: N/A;  . ESOPHAGOGASTRODUODENOSCOPY (EGD) WITH PROPOFOL N/A 04/24/2019   Procedure: ESOPHAGOGASTRODUODENOSCOPY (EGD) WITH PROPOFOL;   Surgeon: Virgel Manifold, MD;  Location: [facility] ENDOSCOPY;  Service: Endoscopy;  Laterality: N/A;    Family History  Problem Relation Age of Onset  . Hypertension Mother     Allergies  Allergen Reactions  . Asa Buff (Mag [Buffered Aspirin] Other (See Comments)    Stomach pain  . Azithromycin Other (See Comments)    GI problems  . Ibuprofen Other (See Comments)    GI upset  . Penicillins Other (See Comments)    Unknown childhood allergic reaction Has patient had a PCN reaction causing immediate rash, facial/tongue/throat swelling, SOB or lightheadedness with hypotension: Unknown Has patient had a PCN reaction causing severe rash involving mucus membranes or skin necrosis: Unknown Has patient had a PCN reaction that required hospitalization: Unknown Has patient had a PCN reaction occurring within the last 10 years: No If all of the above answers are "NO", then may proceed with Cephalosporin use.    Current Outpatient Medications on File Prior to Visit  Medication Sig Dispense Refill  . acetaminophen (TYLENOL) 500 MG tablet Take 1,500 mg by mouth 3 (three) times daily as needed for headache (pain).    Marland Kitchen albuterol (PROVENTIL HFA;VENTOLIN HFA) 108 (90 Base) MCG/ACT inhaler Inhale 1-2 puffs into the lungs every 6 (six) hours as needed for wheezing or shortness of breath. 1 Inhaler 0  . escitalopram (LEXAPRO) 20 MG tablet Take 20 mg by mouth daily.    Marland Kitchen omeprazole (PRILOSEC) 20 MG capsule Take 1 capsule (20 mg total) by mouth 2 (two) times daily. 180 capsule 1  . PHENobarbital (LUMINAL) 97.2 MG tablet TAKE 2 TABLETS BY MOUTH AT BEDTIME 60 tablet 5  . rizatriptan (MAXALT-MLT) 10 MG disintegrating tablet Take 1 tablet (10 mg total) by mouth as needed for migraine. May repeat in 2 hours if needed 9 tablet 11   No current facility-administered medications on file prior to visit.    BP 140/86   Pulse 76   Temp (!) 97.3 F (36.3 C) (Temporal)   Ht 5\' 5"  (1.651 m)   Wt 175 lb 4 oz  (79.5 kg)   SpO2 99%   BMI 29.16 kg/m    Objective:   Physical Exam  Constitutional: She appears well-nourished.  HENT:  Right Ear: Tympanic membrane and ear canal normal. Tympanic membrane is not erythematous and not bulging.  Left Ear: Tympanic membrane and ear canal normal. Tympanic membrane is not erythematous and not bulging.  No obvious effusion  Cardiovascular: Normal rate and regular rhythm.  Respiratory: Effort normal and breath sounds normal.  Musculoskeletal:     Cervical back: Neck supple.  Skin: Skin is warm  and dry.  Psychiatric: She has a normal mood and affect.           Assessment & Plan:

## 2019-08-13 NOTE — Assessment & Plan Note (Addendum)
Elevated in office today.   Encouraged her to monitor her BP at home.   She will notify of readings consistently > 130/90  Agree with plan, Pleas Koch, NP

## 2019-08-13 NOTE — Assessment & Plan Note (Addendum)
Acute x a week and a half. Exam unremarkable today.   Possible effusion, not visualized today.   Start Flonase bid. Prescription sent.   Follow-up if no improvement in symptoms.   Agree with plan, Pleas Koch, NP

## 2019-08-13 NOTE — Progress Notes (Signed)
   Subjective:    Patient ID: Sharon Seller, female    DOB: 01/12/1971, 48 y.o.   MRN: [id number]  HPI  Mrs. Degrasse is a 48 year old female with a history of migraines, asthma, reflux esophagitis, GAD, and seizures who presents today with a chief complaint of L sided otalgia.  She first noticed pain to the L ear a week and a half ago, the pain has gradually worsened. She describes her as a dull ache, 4/10. She has not taken anything for her symptoms.   She denies nasal congestion, sore throat, sinus pressure, cough, fever, chills, headache, dizziness, jaw pain, neck pain or loss of hearing or discharge from her ear. She uses q-tips daily.    Review of Systems  HENT: Positive for ear pain. Negative for congestion, hearing loss, postnasal drip, rhinorrhea, sinus pressure, sinus pain, sore throat and tinnitus.        See HPI  Respiratory: Negative for cough.   Neurological: Negative for dizziness.   BP Readings from Last 3 Encounters:  08/13/19 140/86  07/16/19 116/74  07/03/19 132/68        Objective:   Physical Exam HENT:     Right Ear: Tympanic membrane, ear canal and external ear normal. There is no impacted cerumen.     Left Ear: Tympanic membrane, ear canal and external ear normal. There is no impacted cerumen.  Neurological:     Mental Status: She is alert and oriented to person, place, and time.           Assessment & Plan:

## 2019-08-13 NOTE — Assessment & Plan Note (Signed)
Check A1C today per patient request and family history.  Discussed the importance of a healthy diet and regular exercise in order for weight loss, and to reduce the risk of any potential medical problems.

## 2019-08-13 NOTE — Patient Instructions (Addendum)
Start using Fluticasone (Flonase) nasal spray. Place 1 spray into each nostril twice daily.   Start using the Fluticasone (Flovent HFA) inhaler. Inhale 1 puff twice daily into the lungs.    Continue to use your Albuterol inhaler as needed. I sent a refill of this medication to your pharmacy.  Start monitoring your blood pressure daily, around the same time of day, for the next 2-3 weeks. Ensure that you have rested for 30 minutes prior to checking your blood pressure. Record your readings. If you notice consistent readings >130/80, please notify me.   Stop by the lab prior to leaving today. I will notify you of your results once received.   Please update me on your breathing as well.   It was a pleasure to see you today!

## 2019-08-13 NOTE — Assessment & Plan Note (Signed)
Uncontrolled given recurrent use of SABA with temporary improvement.  Add ICS Flovent BID, discussed rising mouth after each use. Use albuterol PRN.  She will update in a few weeks.

## 2019-08-15 ENCOUNTER — Ambulatory Visit: Payer: BC Managed Care – PPO | Admitting: Primary Care

## 2019-10-09 ENCOUNTER — Ambulatory Visit: Payer: BC Managed Care – PPO | Admitting: Primary Care

## 2019-10-09 ENCOUNTER — Ambulatory Visit (INDEPENDENT_AMBULATORY_CARE_PROVIDER_SITE_OTHER)
Admission: RE | Admit: 2019-10-09 | Discharge: 2019-10-09 | Disposition: A | Discharge status: Home / Self Care | Payer: BC Managed Care – PPO | Source: Ambulatory Visit | Attending: Primary Care | Admitting: Primary Care

## 2019-10-09 ENCOUNTER — Other Ambulatory Visit: Payer: Self-pay | Admitting: Primary Care

## 2019-10-09 ENCOUNTER — Encounter: Payer: Self-pay | Admitting: Primary Care

## 2019-10-09 ENCOUNTER — Other Ambulatory Visit: Payer: Self-pay

## 2019-10-09 VITALS — BP 122/82 | HR 80 | Temp 96.9°F | Ht 65.0 in | Wt 174.0 lb

## 2019-10-09 DIAGNOSIS — J4520 Mild intermittent asthma, uncomplicated: Secondary | ICD-10-CM

## 2019-10-09 DIAGNOSIS — R053 Chronic cough: Secondary | ICD-10-CM

## 2019-10-09 DIAGNOSIS — J4530 Mild persistent asthma, uncomplicated: Secondary | ICD-10-CM

## 2019-10-09 DIAGNOSIS — R05 Cough: Secondary | ICD-10-CM | POA: Diagnosis not present

## 2019-10-09 HISTORY — DX: Chronic cough: R05.3

## 2019-10-09 MED ORDER — FLOVENT HFA 110 MCG/ACT IN AERO: 1.0000 | INHALATION_SPRAY | Freq: Two times a day (BID) | RESPIRATORY_TRACT | 0 refills | Status: DC

## 2019-10-09 MED ORDER — ALBUTEROL SULFATE HFA 108 (90 BASE) MCG/ACT IN AERS: 1.0000 | INHALATION_SPRAY | Freq: Four times a day (QID) | RESPIRATORY_TRACT | 0 refills | Status: DC | PRN

## 2019-10-09 NOTE — Patient Instructions (Signed)
Complete xray(s) prior to leaving today. I will notify you of your results once received.  Shortness of Breath/Wheezing/Cough: Use the albuterol inhaler. Inhale 2 puffs into the lungs every 4 to 6 hours as needed for wheezing, cough, and/or shortness of breath.   I'll be in touch regarding our next step.  It was a pleasure to see you today!

## 2019-10-09 NOTE — Assessment & Plan Note (Signed)
Acute for one month, doesn't seem to be secondary to Covid-19.  Exam today sounds like bronchospasm type cough. She has a history of tobacco abuse, GERD, and asthma.   Start with chest xray today. Refilled albuterol inhaler. Consider Flovent if no obvious cause for cough on xray.  Consider CT chest if no improvement with Flovent. Continue PPI.

## 2019-10-09 NOTE — Progress Notes (Signed)
Subjective:    Patient ID: [name], female    DOB: 06-04-1971, 49 y.o.   MRN: [id number]  HPI  This visit occurred during the SARS-CoV-2 public health emergency.  Safety protocols were in place, including screening questions prior to the visit, additional usage of staff PPE, and extensive cleaning of exam room while observing appropriate contact time as indicated for disinfecting solutions.   [name] is a 49 year old female with a history of asthma, migraines, tobacco abuse (quit in 2015, smoked for 30 years ago) GAD, GERD who presents today with a chief complaint of cough. '  Her cough is dry and which began about one month ago. Her cough is constant during the day, especially when she's talking a lot. She feels a sensation to her upper esophagus with some shortness of breath. She finds herself thirsty. She cannot speak in long conversation due to cough.  She denies prior illness, cough at night, esophageal burning, fevers, loss of taste/smell, diarrhea, fatigue. She's not been using her albuterol inhaler as she cannot find it. She is compliant to her omeprazole 20 mg daily.  BP Readings from Last 3 Encounters:  10/09/19 122/82  08/13/19 140/86  07/16/19 116/74     Review of Systems  Constitutional: Negative for fatigue and fever.  HENT: Positive for postnasal drip. Negative for congestion.   Respiratory: Positive for cough and shortness of breath. Negative for wheezing.   Cardiovascular: Negative for chest pain.  Gastrointestinal: Negative for diarrhea and nausea.       Past Medical History:  Diagnosis Date  . Asthma    worse when younger  . GERD (gastroesophageal reflux disease)   . Migraine    1-2x/month  . PONV (postoperative nausea and vomiting)   . Seizure ([place])   . Vertigo    no episodes for 7 yrs     Social History   Socioeconomic History  . Marital status: Widowed    Spouse name: [name]  . Number of children: 0  . Years of education: 10th    . Highest education level: Not on file  Occupational History    Employer: [id number]  Tobacco Use  . Smoking status: Former Smoker    Packs/day: 1.00    Types: Cigarettes    Quit date: 08/28/2013    Years since quitting: 6.1  . Smokeless tobacco: Never Used  . Tobacco comment: Quit Janurary 1st  Substance and Sexual Activity  . Alcohol use: No    Alcohol/week: 0.0 standard drinks  . Drug use: No  . Sexual activity: Not on file  Other Topics Concern  . Not on file  Social History Narrative   Patient lives at home with spouse.   No children.   Works at [employer].   Enjoys going to [employer], going to estate sells.   Right handed   Caffeine: 1 Dr. [name] every morning   Social Determinants of Health   Financial Resource Strain:   . Difficulty of Paying Living Expenses: Not on file  Food Insecurity:   . Worried About Charity fundraiser in the Last Year: Not on file  . Ran Out of Food in the Last Year: Not on file  Transportation Needs:   . Lack of Transportation (Medical): Not on file  . Lack of Transportation (Non-Medical): Not on file  Physical Activity:   . Days of Exercise per Week: Not on file  . Minutes of Exercise per Session: Not on file  Stress:   .  Feeling of Stress : Not on file  Social Connections:   . Frequency of Communication with Friends and Family: Not on file  . Frequency of Social Gatherings with Friends and Family: Not on file  . Attends Religious Services: Not on file  . Active Member of Clubs or Organizations: Not on file  . Attends Archivist Meetings: Not on file  . Marital Status: Not on file  Intimate Partner Violence:   . Fear of Current or Ex-Partner: Not on file  . Emotionally Abused: Not on file  . Physically Abused: Not on file  . Sexually Abused: Not on file    Past Surgical History:  Procedure Laterality Date  . ABDOMINAL HYSTERECTOMY  2012  . COLONOSCOPY WITH PROPOFOL N/A 04/24/2019   Procedure: COLONOSCOPY WITH PROPOFOL;   Surgeon: Virgel Manifold, MD;  Location: [facility] ENDOSCOPY;  Service: Endoscopy;  Laterality: N/A;  . ESOPHAGOGASTRODUODENOSCOPY (EGD) WITH PROPOFOL N/A 04/24/2019   Procedure: ESOPHAGOGASTRODUODENOSCOPY (EGD) WITH PROPOFOL;  Surgeon: Virgel Manifold, MD;  Location: [facility] ENDOSCOPY;  Service: Endoscopy;  Laterality: N/A;    Family History  Problem Relation Age of Onset  . Hypertension Mother     Allergies  Allergen Reactions  . Asa Buff (Mag [Buffered Aspirin] Other (See Comments)    Stomach pain  . Azithromycin Other (See Comments)    GI problems  . Ibuprofen Other (See Comments)    GI upset  . Penicillins Other (See Comments)    Unknown childhood allergic reaction Has patient had a PCN reaction causing immediate rash, facial/tongue/throat swelling, SOB or lightheadedness with hypotension: Unknown Has patient had a PCN reaction causing severe rash involving mucus membranes or skin necrosis: Unknown Has patient had a PCN reaction that required hospitalization: Unknown Has patient had a PCN reaction occurring within the last 10 years: No If all of the above answers are "NO", then may proceed with Cephalosporin use.    Current Outpatient Medications on File Prior to Visit  Medication Sig Dispense Refill  . acetaminophen (TYLENOL) 500 MG tablet Take 1,500 mg by mouth 3 (three) times daily as needed for headache (pain).    Marland Kitchen escitalopram (LEXAPRO) 20 MG tablet Take 20 mg by mouth daily.    . fluticasone (FLONASE) 50 MCG/ACT nasal spray Place 1 spray into both nostrils 2 (two) times daily. 16 g 0  . omeprazole (PRILOSEC) 20 MG capsule Take 1 capsule (20 mg total) by mouth 2 (two) times daily. 180 capsule 1  . PHENobarbital (LUMINAL) 97.2 MG tablet TAKE 2 TABLETS BY MOUTH AT BEDTIME 60 tablet 5  . rizatriptan (MAXALT-MLT) 10 MG disintegrating tablet Take 1 tablet (10 mg total) by mouth as needed for migraine. May repeat in 2 hours if needed 9 tablet 11  . fluticasone (FLOVENT HFA)  110 MCG/ACT inhaler Inhale 1 puff into the lungs 2 (two) times daily. For asthma (Patient not taking: Reported on 10/09/2019) 1 Inhaler 0   No current facility-administered medications on file prior to visit.    BP 122/82   Pulse 80   Temp (!) 96.9 F (36.1 C) (Temporal)   Ht 5\' 5"  (1.651 m)   Wt 174 lb (78.9 kg)   SpO2 97%   BMI 28.96 kg/m    Objective:   Physical Exam  Constitutional: She appears well-nourished. She does not appear ill.  Cardiovascular: Normal rate and regular rhythm.  Respiratory: Effort normal and breath sounds normal. No respiratory distress. She has no wheezes. She has no rales.  Speaking  in short sentences. Persistent dry, bronchospasm cough during exam  Musculoskeletal:     Cervical back: Neck supple.  Skin: Skin is warm and dry.           Assessment & Plan:

## 2019-10-10 ENCOUNTER — Ambulatory Visit: Payer: BC Managed Care – PPO | Admitting: Primary Care

## 2019-10-21 ENCOUNTER — Ambulatory Visit: Payer: BC Managed Care – PPO | Admitting: Primary Care

## 2019-10-21 ENCOUNTER — Ambulatory Visit (INDEPENDENT_AMBULATORY_CARE_PROVIDER_SITE_OTHER): Payer: BC Managed Care – PPO | Admitting: Primary Care

## 2019-10-21 ENCOUNTER — Other Ambulatory Visit: Payer: Self-pay

## 2019-10-21 ENCOUNTER — Encounter: Payer: Self-pay | Admitting: Primary Care

## 2019-10-21 DIAGNOSIS — R05 Cough: Secondary | ICD-10-CM

## 2019-10-21 DIAGNOSIS — E785 Hyperlipidemia, unspecified: Secondary | ICD-10-CM

## 2019-10-21 DIAGNOSIS — R059 Cough, unspecified: Secondary | ICD-10-CM

## 2019-10-21 DIAGNOSIS — R053 Chronic cough: Secondary | ICD-10-CM

## 2019-10-21 MED ORDER — BUDESONIDE-FORMOTEROL FUMARATE 160-4.5 MCG/ACT IN AERO: 1.0000 | INHALATION_SPRAY | Freq: Two times a day (BID) | RESPIRATORY_TRACT | 0 refills | Status: DC

## 2019-10-21 NOTE — Patient Instructions (Addendum)
Stop fluticasone (Flovent) inhaler.   Start budesonide-formoterol (Symbicort) inhaler and inhale 1 puff into the lungs twice daily.   Only use the albuterol inhaler if needed.   Come in for labs when you're able.  Rosaria Ferries will contact you for the CT chest.  I will be in touch with results.   It was a pleasure to see you today!

## 2019-10-21 NOTE — Progress Notes (Signed)
Subjective:    Patient ID: [name], female    DOB: [birth year], 49 y.o.   MRN: [id number]  HPI     [name] - 49 y.o. female  MRN [id number]  Date of Birth: 1971/03/18  PCP: Pleas Koch, NP  This service was provided via telemedicine. Phone Visit performed on 10/21/2019    Rationale for phone visit along with limitations reviewed. Patient consented to telephone encounter.    Location of patient: Home Location of provider: Office at [employer] @ [facility] Name of referring provider: N/A   Names of persons and role in encounter: Provider: Pleas Koch, NP  Patient: [name]  Other: N/A   Time on call: 12 min  - 06 sec   Subjective: Chief Complaint  Patient presents with  . Cough     HPI:  [name] is a 49 year old female with a history of asthma, migraines, reflux esophagitis, GAD, seizure disorder, persistent cough who presents today with a chief complaint of cough.  She was last evaluated two weeks ago for persistent cough that had been persistent and daily since early January 2021. She endorsed compliance to her omeprazole 20 mg, had not used her albuterol inhaler in quite sometime, had no other inhaler. We initiated Flovent inhaler BID, provided her with albuterol to use PRN. She underwent chest xray which was negative.  Today she endorses that her cough is worse. She cannot walk and talk due to shortness of breath and cough. She has trouble getting through a sentence without coughing. She can feel chest tightness to the substernal chest closer to her neck. Her cough continues to remain dry.   She denies known exposure to Covid-19, loss of taste/smell, diarrhea, fevers, chest pain upon inspiration. She is using her Flovent twice daily and has been using her albuterol 2-3 times daily. She hasn't noticed any improvement on Flovent, only a 2-3 minute improvement with albuterol. She is would like a CT scan done as her  husband passed way from a rare form of lung cancer.    Objective/Observations:  Persistent, dry cough during exam.  Appears short of breath.  No physical exam or vital signs collected unless specifically identified below.   There were no vitals taken for this visit.   Respiratory status: speaks in complete sentences without evident shortness of breath.   Assessment/Plan:  See problem based charting.  No problem-specific Assessment & Plan notes found for this encounter.   I discussed the assessment and treatment plan with the patient. The patient was provided an opportunity to ask questions and all were answered. The patient agreed with the plan and demonstrated an understanding of the instructions.  Lab Orders  No laboratory test(s) ordered today    No orders of the defined types were placed in this encounter.   The patient was advised to call back or seek an in-person evaluation if the symptoms worsen or if the condition fails to improve as anticipated.  Pleas Koch, NP    Review of Systems  Constitutional: Negative for chills and fever.  HENT: Negative for congestion and postnasal drip.        Denies loss of taste/smell  Respiratory: Positive for cough, chest tightness and shortness of breath.   Cardiovascular: Negative for chest pain.  Gastrointestinal: Negative for diarrhea.       Past Medical History:  Diagnosis Date  . Asthma    worse when younger  . GERD (gastroesophageal reflux  disease)   . Migraine    1-2x/month  . PONV (postoperative nausea and vomiting)   . Seizure ([place])   . Vertigo    no episodes for 7 yrs     Social History   Socioeconomic History  . Marital status: Widowed    Spouse name: [name]  . Number of children: 0  . Years of education: 10th  . Highest education level: Not on file  Occupational History    Employer: [id number]  Tobacco Use  . Smoking status: Former Smoker    Packs/day: 1.00    Types: Cigarettes    Quit date:  08/28/2013    Years since quitting: 6.1  . Smokeless tobacco: Never Used  . Tobacco comment: Quit Janurary 1st  Substance and Sexual Activity  . Alcohol use: No    Alcohol/week: 0.0 standard drinks  . Drug use: No  . Sexual activity: Not on file  Other Topics Concern  . Not on file  Social History Narrative   Patient lives at home with spouse.   No children.   Works at [employer].   Enjoys going to [employer], going to estate sells.   Right handed   Caffeine: 1 Dr. [name] every morning   Social Determinants of Health   Financial Resource Strain:   . Difficulty of Paying Living Expenses: Not on file  Food Insecurity:   . Worried About Charity fundraiser in the Last Year: Not on file  . Ran Out of Food in the Last Year: Not on file  Transportation Needs:   . Lack of Transportation (Medical): Not on file  . Lack of Transportation (Non-Medical): Not on file  Physical Activity:   . Days of Exercise per Week: Not on file  . Minutes of Exercise per Session: Not on file  Stress:   . Feeling of Stress : Not on file  Social Connections:   . Frequency of Communication with Friends and Family: Not on file  . Frequency of Social Gatherings with Friends and Family: Not on file  . Attends Religious Services: Not on file  . Active Member of Clubs or Organizations: Not on file  . Attends Archivist Meetings: Not on file  . Marital Status: Not on file  Intimate Partner Violence:   . Fear of Current or Ex-Partner: Not on file  . Emotionally Abused: Not on file  . Physically Abused: Not on file  . Sexually Abused: Not on file    Past Surgical History:  Procedure Laterality Date  . ABDOMINAL HYSTERECTOMY  2012  . COLONOSCOPY WITH PROPOFOL N/A 04/24/2019   Procedure: COLONOSCOPY WITH PROPOFOL;  Surgeon: Virgel Manifold, MD;  Location: [facility] ENDOSCOPY;  Service: Endoscopy;  Laterality: N/A;  . ESOPHAGOGASTRODUODENOSCOPY (EGD) WITH PROPOFOL N/A 04/24/2019   Procedure:  ESOPHAGOGASTRODUODENOSCOPY (EGD) WITH PROPOFOL;  Surgeon: Virgel Manifold, MD;  Location: [facility] ENDOSCOPY;  Service: Endoscopy;  Laterality: N/A;    Family History  Problem Relation Age of Onset  . Hypertension Mother     Allergies  Allergen Reactions  . Asa Buff (Mag [Buffered Aspirin] Other (See Comments)    Stomach pain  . Azithromycin Other (See Comments)    GI problems  . Ibuprofen Other (See Comments)    GI upset  . Penicillins Other (See Comments)    Unknown childhood allergic reaction Has patient had a PCN reaction causing immediate rash, facial/tongue/throat swelling, SOB or lightheadedness with hypotension: Unknown Has patient had a PCN reaction causing severe rash involving  mucus membranes or skin necrosis: Unknown Has patient had a PCN reaction that required hospitalization: Unknown Has patient had a PCN reaction occurring within the last 10 years: No If all of the above answers are "NO", then may proceed with Cephalosporin use.    Current Outpatient Medications on File Prior to Visit  Medication Sig Dispense Refill  . acetaminophen (TYLENOL) 500 MG tablet Take 1,500 mg by mouth 3 (three) times daily as needed for headache (pain).    Marland Kitchen albuterol (VENTOLIN HFA) 108 (90 Base) MCG/ACT inhaler Inhale 1-2 puffs into the lungs every 6 (six) hours as needed for wheezing or shortness of breath. 18 g 0  . escitalopram (LEXAPRO) 20 MG tablet Take 20 mg by mouth daily.    . fluticasone (FLONASE) 50 MCG/ACT nasal spray Place 1 spray into both nostrils 2 (two) times daily. 16 g 0  . fluticasone (FLOVENT HFA) 110 MCG/ACT inhaler Inhale 1 puff into the lungs 2 (two) times daily. For asthma 1 Inhaler 0  . omeprazole (PRILOSEC) 20 MG capsule Take 1 capsule (20 mg total) by mouth 2 (two) times daily. 180 capsule 1  . PHENobarbital (LUMINAL) 97.2 MG tablet TAKE 2 TABLETS BY MOUTH AT BEDTIME 60 tablet 5  . rizatriptan (MAXALT-MLT) 10 MG disintegrating tablet Take 1 tablet (10 mg total)  by mouth as needed for migraine. May repeat in 2 hours if needed 9 tablet 11   No current facility-administered medications on file prior to visit.    There were no vitals taken for this visit.   Objective:   Physical Exam  Constitutional: She is oriented to person, place, and time.  Respiratory: No respiratory distress.  Persistent, dry cough during exam. Speaking in incomplete sentences due to cough and shortness of breath.  Neurological: She is alert and oriented to person, place, and time.  Psychiatric: She has a normal mood and affect.           Assessment & Plan:

## 2019-10-21 NOTE — Assessment & Plan Note (Signed)
Continued and worse per patient. She does have a persistent, dry cough during exam.  No improvement with ICS, very little improvement with SABA. Stop Flovent. Start Symbicort BID. Discussed to use albuterol only if needed.  Check labs including CBC with diff, D-dimer, Lipids.  CT chest pending.

## 2019-10-22 ENCOUNTER — Other Ambulatory Visit: Payer: Self-pay

## 2019-10-22 ENCOUNTER — Other Ambulatory Visit: Payer: BC Managed Care – PPO

## 2019-10-22 ENCOUNTER — Other Ambulatory Visit: Payer: Self-pay | Admitting: Primary Care

## 2019-10-22 ENCOUNTER — Ambulatory Visit
Admission: RE | Admit: 2019-10-22 | Discharge: 2019-10-22 | Disposition: A | Discharge status: Home / Self Care | Payer: BC Managed Care – PPO | Source: Ambulatory Visit | Attending: Primary Care | Admitting: Primary Care

## 2019-10-22 DIAGNOSIS — J4541 Moderate persistent asthma with (acute) exacerbation: Secondary | ICD-10-CM

## 2019-10-22 DIAGNOSIS — R053 Chronic cough: Secondary | ICD-10-CM

## 2019-10-22 DIAGNOSIS — R05 Cough: Secondary | ICD-10-CM

## 2019-10-22 MED ORDER — PREDNISONE 20 MG PO TABS: ORAL_TABLET | ORAL | 0 refills | Status: DC

## 2019-10-22 NOTE — Telephone Encounter (Signed)
Spoken and notified patient of Kate Clark's comments. Patient verbalized understanding.  

## 2019-10-28 ENCOUNTER — Ambulatory Visit: Payer: BC Managed Care – PPO | Admitting: Primary Care

## 2019-12-05 ENCOUNTER — Other Ambulatory Visit: Payer: Self-pay

## 2019-12-18 ENCOUNTER — Encounter: Payer: Self-pay | Admitting: *Deleted

## 2019-12-18 ENCOUNTER — Other Ambulatory Visit: Payer: Self-pay | Admitting: Diagnostic Neuroimaging

## 2019-12-18 DIAGNOSIS — G40B09 Juvenile myoclonic epilepsy, not intractable, without status epilepticus: Secondary | ICD-10-CM

## 2020-01-05 ENCOUNTER — Telehealth: Payer: Self-pay

## 2020-01-05 NOTE — Telephone Encounter (Addendum)
Pt and her mom on phone; pt has not been exposed to covid but pts employer is requiring pt to have a covid test. Pt will call [phone] for covid testing sites and information. Nothing further needed at this time. FYI to Gentry Fitz NP.

## 2020-01-06 NOTE — Telephone Encounter (Signed)
Noted  

## 2020-01-07 ENCOUNTER — Ambulatory Visit: Payer: BC Managed Care – PPO | Attending: Internal Medicine

## 2020-01-07 ENCOUNTER — Telehealth (INDEPENDENT_AMBULATORY_CARE_PROVIDER_SITE_OTHER): Payer: BC Managed Care – PPO | Admitting: Primary Care

## 2020-01-07 DIAGNOSIS — R112 Nausea with vomiting, unspecified: Secondary | ICD-10-CM | POA: Diagnosis not present

## 2020-01-07 DIAGNOSIS — Z20822 Contact with and (suspected) exposure to covid-19: Secondary | ICD-10-CM

## 2020-01-07 MED ORDER — ONDANSETRON 4 MG PO TBDP: 4.0000 mg | ORAL_TABLET | Freq: Three times a day (TID) | ORAL | 0 refills | Status: DC | PRN

## 2020-01-07 NOTE — Assessment & Plan Note (Signed)
Symptoms highly suspicious for Covid-19 virus.  She is on her way for Covid testing now. We will treat conservatively with oral antiemetics given her nausea.  Also discussed other OTC treatment for symptoms. She will update regarding her Covid-19 test. She is quarantined, discussed duration.  Advised to contact us or ED if symptoms began unmanageable.

## 2020-01-07 NOTE — Progress Notes (Signed)
Subjective:    Patient ID: [name], female    DOB: 11-11-1970, 49 y.o.   MRN: [id number]  HPI  Virtual Visit via Video Note  I connected with [name] on 01/07/20 at  8:40 AM EDT by a video enabled telemedicine application and verified that I am speaking with the correct person using two identifiers.  Location: Patient: Musician Provider: Office   I discussed the limitations of evaluation and management by telemedicine and the availability of in person appointments. The patient expressed understanding and agreed to proceed.  History of Present Illness:  Ms. Blakenship is a 49 year old female with a history of asthma, migraines, esophageal dysphagia, seizure disorder, GAD who presents today with a chief complaint of nausea.  She also reports vomiting, chills, cough, altered taste. Symptoms began three days ago, vomiting began last night. She cannot eat or drink anything without vomiting.   She denies diarrhea, known Covid-19 exposure. She did go shopping this past weekend for Mother's day.  She is currently on her way for a Covid-19 test in [place]. She's not taken anything OTC for symptoms.    Observations/Objective:  Alert and oriented. Appears tired and sickly. No distress. Speaking in complete sentences. No cough.  Assessment and Plan:  Symptoms highly suspicious for Covid-19 virus.  She is on her way for Covid testing now. We will treat conservatively with oral antiemetics given her nausea.  Also discussed other OTC treatment for symptoms. She will update regarding her Covid-19 test. She is quarantined, discussed duration.  Advised to contact us or ED if symptoms began unmanageable.   Follow Up Instructions:  Complete Covid-19 testing.  You may take the ondansetron every 8 hours as needed for nausea and vomiting.  You must remain quarantined through May 18th.   Please update me regarding your Covid-19 test. Message me if you need  anything!  Allie Bossier, NP-C    I discussed the assessment and treatment plan with the patient. The patient was provided an opportunity to ask questions and all were answered. The patient agreed with the plan and demonstrated an understanding of the instructions.   The patient was advised to call back or seek an in-person evaluation if the symptoms worsen or if the condition fails to improve as anticipated.   Pleas Koch, NP    Review of Systems  Constitutional: Positive for chills and fatigue.  HENT: Positive for congestion.        Altered taste   Respiratory: Positive for cough.   Gastrointestinal: Positive for nausea and vomiting. Negative for abdominal pain and diarrhea.       Past Medical History:  Diagnosis Date  . Asthma    worse when younger  . GERD (gastroesophageal reflux disease)   . Migraine    1-2x/month  . PONV (postoperative nausea and vomiting)   . Seizure ([place])   . Vertigo    no episodes for 7 yrs     Social History   Socioeconomic History  . Marital status: Widowed    Spouse name: [name]  . Number of children: 0  . Years of education: 10th  . Highest education level: Not on file  Occupational History    Employer: [id number]  Tobacco Use  . Smoking status: Former Smoker    Packs/day: 1.00    Types: Cigarettes    Quit date: 08/28/2013    Years since quitting: 6.3  . Smokeless tobacco: Never Used  . Tobacco comment: Quit Janurary 1st  Substance and Sexual Activity  . Alcohol use: No    Alcohol/week: 0.0 standard drinks  . Drug use: No  . Sexual activity: Not on file  Other Topics Concern  . Not on file  Social History Narrative   Patient lives at home with spouse.   No children.   Works at [employer].   Enjoys going to [employer], going to estate sells.   Right handed   Caffeine: 1 Dr. [name] every morning   Social Determinants of Health   Financial Resource Strain:   . Difficulty of Paying Living Expenses:   Food Insecurity:   .  Worried About Charity fundraiser in the Last Year:   . Arboriculturist in the Last Year:   Transportation Needs:   . Film/video editor (Medical):   Marland Kitchen Lack of Transportation (Non-Medical):   Physical Activity:   . Days of Exercise per Week:   . Minutes of Exercise per Session:   Stress:   . Feeling of Stress :   Social Connections:   . Frequency of Communication with Friends and Family:   . Frequency of Social Gatherings with Friends and Family:   . Attends Religious Services:   . Active Member of Clubs or Organizations:   . Attends Archivist Meetings:   Marland Kitchen Marital Status:   Intimate Partner Violence:   . Fear of Current or Ex-Partner:   . Emotionally Abused:   Marland Kitchen Physically Abused:   . Sexually Abused:     Past Surgical History:  Procedure Laterality Date  . ABDOMINAL HYSTERECTOMY  2012  . COLONOSCOPY WITH PROPOFOL N/A 04/24/2019   Procedure: COLONOSCOPY WITH PROPOFOL;  Surgeon: Virgel Manifold, MD;  Location: [facility] ENDOSCOPY;  Service: Endoscopy;  Laterality: N/A;  . ESOPHAGOGASTRODUODENOSCOPY (EGD) WITH PROPOFOL N/A 04/24/2019   Procedure: ESOPHAGOGASTRODUODENOSCOPY (EGD) WITH PROPOFOL;  Surgeon: Virgel Manifold, MD;  Location: [facility] ENDOSCOPY;  Service: Endoscopy;  Laterality: N/A;    Family History  Problem Relation Age of Onset  . Hypertension Mother     Allergies  Allergen Reactions  . Asa Buff (Mag [Buffered Aspirin] Other (See Comments)    Stomach pain  . Azithromycin Other (See Comments)    GI problems  . Ibuprofen Other (See Comments)    GI upset  . Penicillins Other (See Comments)    Unknown childhood allergic reaction Has patient had a PCN reaction causing immediate rash, facial/tongue/throat swelling, SOB or lightheadedness with hypotension: Unknown Has patient had a PCN reaction causing severe rash involving mucus membranes or skin necrosis: Unknown Has patient had a PCN reaction that required hospitalization: Unknown Has patient  had a PCN reaction occurring within the last 10 years: No If all of the above answers are "NO", then may proceed with Cephalosporin use.    Current Outpatient Medications on File Prior to Visit  Medication Sig Dispense Refill  . acetaminophen (TYLENOL) 500 MG tablet Take 1,500 mg by mouth 3 (three) times daily as needed for headache (pain).    Marland Kitchen albuterol (VENTOLIN HFA) 108 (90 Base) MCG/ACT inhaler Inhale 1-2 puffs into the lungs every 6 (six) hours as needed for wheezing or shortness of breath. 18 g 0  . budesonide-formoterol (SYMBICORT) 160-4.5 MCG/ACT inhaler Inhale 1 puff into the lungs 2 (two) times daily. 1 Inhaler 0  . escitalopram (LEXAPRO) 20 MG tablet Take 20 mg by mouth daily.    . fluticasone (FLONASE) 50 MCG/ACT nasal spray Place 1 spray into both nostrils 2 (two)  times daily. 16 g 0  . omeprazole (PRILOSEC) 20 MG capsule Take 1 capsule (20 mg total) by mouth 2 (two) times daily. 180 capsule 1  . PHENobarbital (LUMINAL) 97.2 MG tablet TAKE 2 TABLETS BY MOUTH AT BEDTIME 60 tablet 5  . predniSONE (DELTASONE) 20 MG tablet Take 2 tablets once daily for five days. 10 tablet 0  . rizatriptan (MAXALT-MLT) 10 MG disintegrating tablet Take 1 tablet (10 mg total) by mouth as needed for migraine. May repeat in 2 hours if needed 9 tablet 11   No current facility-administered medications on file prior to visit.    There were no vitals taken for this visit.   Objective:   Physical Exam  Constitutional: She is oriented to person, place, and time.  Appears tired, sickly  Respiratory: Effort normal.  No cough  Neurological: She is alert and oriented to person, place, and time.  Psychiatric: She has a normal mood and affect.           Assessment & Plan:

## 2020-01-07 NOTE — Patient Instructions (Signed)
Complete Covid-19 testing.  You may take the ondansetron every 8 hours as needed for nausea and vomiting.  You must remain quarantined through May 18th.   Please update me regarding your Covid-19 test. Message me if you need anything!  Allie Bossier, NP-C

## 2020-01-08 ENCOUNTER — Other Ambulatory Visit: Payer: Self-pay

## 2020-01-08 ENCOUNTER — Emergency Department (HOSPITAL_COMMUNITY)
Admission: EM | Admit: 2020-01-08 | Discharge: 2020-01-09 | Disposition: A | Discharge status: Home / Self Care | Payer: BC Managed Care – PPO | Attending: Emergency Medicine | Admitting: Emergency Medicine

## 2020-01-08 ENCOUNTER — Encounter (HOSPITAL_COMMUNITY): Payer: Self-pay | Admitting: Emergency Medicine

## 2020-01-08 DIAGNOSIS — R112 Nausea with vomiting, unspecified: Secondary | ICD-10-CM

## 2020-01-08 DIAGNOSIS — Z87891 Personal history of nicotine dependence: Secondary | ICD-10-CM | POA: Insufficient documentation

## 2020-01-08 DIAGNOSIS — R1013 Epigastric pain: Secondary | ICD-10-CM | POA: Insufficient documentation

## 2020-01-08 DIAGNOSIS — J45909 Unspecified asthma, uncomplicated: Secondary | ICD-10-CM | POA: Diagnosis not present

## 2020-01-08 DIAGNOSIS — Z79899 Other long term (current) drug therapy: Secondary | ICD-10-CM | POA: Insufficient documentation

## 2020-01-08 LAB — URINALYSIS, ROUTINE W REFLEX MICROSCOPIC
Glucose, UA: NEGATIVE mg/dL
Hgb urine dipstick: NEGATIVE
Ketones, ur: 15 mg/dL — AB
Leukocytes,Ua: NEGATIVE
Nitrite: NEGATIVE
Protein, ur: NEGATIVE mg/dL
Specific Gravity, Urine: >1.03 — ABNORMAL HIGH (ref 1.005–1.030)
pH: 5 (ref 5.0–8.0)

## 2020-01-08 LAB — CBC WITH DIFFERENTIAL/PLATELET
Abs Immature Granulocytes: 0.02 10*3/uL (ref 0.00–0.07)
Basophils Absolute: 0.1 10*3/uL (ref 0.0–0.1)
Basophils Relative: 1 %
Eosinophils Absolute: 0.2 10*3/uL (ref 0.0–0.5)
Eosinophils Relative: 2 %
HCT: 48.1 % — ABNORMAL HIGH (ref 36.0–46.0)
Hemoglobin: 16.5 g/dL — ABNORMAL HIGH (ref 12.0–15.0)
Immature Granulocytes: 0 %
Lymphocytes Relative: 32 %
Lymphs Abs: 2.6 10*3/uL (ref 0.7–4.0)
MCH: 31.8 pg (ref 26.0–34.0)
MCHC: 34.3 g/dL (ref 30.0–36.0)
MCV: 92.7 fL (ref 80.0–100.0)
Monocytes Absolute: 0.7 10*3/uL (ref 0.1–1.0)
Monocytes Relative: 8 %
Neutro Abs: 4.6 10*3/uL (ref 1.7–7.7)
Neutrophils Relative %: 57 %
Platelets: 237 10*3/uL (ref 150–400)
RBC: 5.19 MIL/uL — ABNORMAL HIGH (ref 3.87–5.11)
RDW: 11.7 % (ref 11.5–15.5)
WBC: 8.1 10*3/uL (ref 4.0–10.5)
nRBC: 0 % (ref 0.0–0.2)

## 2020-01-08 LAB — COMPREHENSIVE METABOLIC PANEL
ALT: 21 U/L (ref 0–44)
AST: 14 U/L — ABNORMAL LOW (ref 15–41)
Albumin: 4.2 g/dL (ref 3.5–5.0)
Alkaline Phosphatase: 89 U/L (ref 38–126)
Anion gap: 13 (ref 5–15)
BUN: 16 mg/dL (ref 6–20)
CO2: 24 mmol/L (ref 22–32)
Calcium: 9.3 mg/dL (ref 8.9–10.3)
Chloride: 101 mmol/L (ref 98–111)
Creatinine, Ser: 0.84 mg/dL (ref 0.44–1.00)
GFR calc Af Amer: >60 mL/min (ref >60)
GFR calc non Af Amer: >60 mL/min (ref >60)
Glucose, Bld: 106 mg/dL — ABNORMAL HIGH (ref 70–99)
Potassium: 3.8 mmol/L (ref 3.5–5.1)
Sodium: 138 mmol/L (ref 135–145)
Total Bilirubin: 0.9 mg/dL (ref 0.3–1.2)
Total Protein: 7.7 g/dL (ref 6.5–8.1)

## 2020-01-08 LAB — NOVEL CORONAVIRUS, NAA: SARS-CoV-2, NAA: NOT DETECTED

## 2020-01-08 LAB — SARS-COV-2, NAA 2 DAY TAT

## 2020-01-08 LAB — LIPASE, BLOOD: Lipase: 46 U/L (ref 11–51)

## 2020-01-08 LAB — TROPONIN I (HIGH SENSITIVITY): Troponin I (High Sensitivity): 4 ng/L (ref <18)

## 2020-01-08 NOTE — ED Triage Notes (Signed)
Patient reports epigastric pain with emesis onset Sunday this week , no diarrhea or fever , respirations unlabored.

## 2020-01-09 ENCOUNTER — Emergency Department (HOSPITAL_COMMUNITY): Payer: BC Managed Care – PPO

## 2020-01-09 ENCOUNTER — Other Ambulatory Visit: Payer: BC Managed Care – PPO

## 2020-01-09 LAB — TROPONIN I (HIGH SENSITIVITY): Troponin I (High Sensitivity): 3 ng/L (ref <18)

## 2020-01-09 MED ORDER — IOHEXOL 300 MG/ML  SOLN
100.0000 mL | Freq: Once | INTRAMUSCULAR | Status: AC | PRN
  Administered 2020-01-09: 100 mL via INTRAVENOUS

## 2020-01-09 MED ORDER — ONDANSETRON HCL 4 MG/2ML IJ SOLN
4.0000 mg | Freq: Once | INTRAMUSCULAR | Status: AC
  Administered 2020-01-09: 4 mg via INTRAVENOUS
  Filled 2020-01-09: qty 2

## 2020-01-09 MED ORDER — ACETAMINOPHEN 325 MG PO TABS
650.0000 mg | ORAL_TABLET | Freq: Once | ORAL | Status: AC
  Administered 2020-01-09: 650 mg via ORAL
  Filled 2020-01-09: qty 2

## 2020-01-09 MED ORDER — OMEPRAZOLE 20 MG PO CPDR
20.0000 mg | DELAYED_RELEASE_CAPSULE | Freq: Two times a day (BID) | ORAL | 0 refills | Status: DC
Start: 2020-01-09 — End: 2020-01-16

## 2020-01-09 MED ORDER — ONDANSETRON HCL 4 MG PO TABS
4.0000 mg | ORAL_TABLET | Freq: Four times a day (QID) | ORAL | 0 refills | Status: DC
Start: 2020-01-09 — End: 2020-01-16

## 2020-01-09 MED ORDER — FAMOTIDINE 20 MG PO TABS
20.0000 mg | ORAL_TABLET | Freq: Two times a day (BID) | ORAL | 0 refills | Status: DC
Start: 2020-01-09 — End: 2020-01-16

## 2020-01-09 NOTE — Discharge Instructions (Addendum)
Patient take medication as prescribed, she should avoid foods that are spicy, carbonated beverages, caffeine, chocolate, and should stay on a bland diet for the next couple of days.  Patient should continue to monitor symptoms and follow-up with her PCP for further evaluation management.  If symptoms worsening come back to the emergency room

## 2020-01-09 NOTE — ED Provider Notes (Signed)
[facility] EMERGENCY DEPARTMENT Provider Note   CSN: [id number] Arrival date & time: 01/08/20  2044     History Chief Complaint  Patient presents with  . Epigastric Pain    [name] is a 49 y.o. female.  HPI Patient presents to the emergency department with epigastric pain and nausea, started on Mother's Day and has progressively gotten worse unable to hold down food.  Patient explains that she has felt nauseous since Mother's Day and has been unable to hold down any solid foods but has been able to drink some water.  Patient denies seeing any blood in her vomit but states that it looked frothy.  Patient's states she has had hernia which she feels could be causing her symptoms.  Patient admits to subjective fever and chills.  She also states that she has not had a bowel movement in over a week and has passed little flatus.  The pain is in her epigastric region and it does not radiate patient described it as a constant sharp pain.  She has a significant medical history of seizures, migraines, GERD, asthma.  Patient denies headache, cough, congestion, sore throat, shortness of breath, chest pain    Past Medical History:  Diagnosis Date  . Asthma    worse when younger  . GERD (gastroesophageal reflux disease)   . Migraine    1-2x/month  . PONV (postoperative nausea and vomiting)   . Seizure ([place])   . Vertigo    no episodes for 7 yrs    Patient Active Problem List   Diagnosis Date Noted  . Suspected COVID-19 virus infection 01/07/2020  . Persistent cough for 3 weeks or longer 10/09/2019  . Left ear pain 08/13/2019  . Overweight (BMI 25.0-29.9) 08/13/2019  . Elevated blood pressure reading 08/13/2019  . Asthma 07/03/2019  . Reflux esophagitis   . Abdominal pain, epigastric   . Esophageal dysphagia   . Colon cancer screening   . SOB (shortness of breath) 06/18/2018  . Chest pain 06/18/2018  . Acquired mallet finger of right hand 05/27/2018    . Bilateral hand pain 05/27/2018  . 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 Chronic foot pain 05/15/2018  . Dupuytren's contracture of both hands 05/15/2018  . Esophageal thickening 11/30/2017  . Rash and nonspecific skin eruption 04/20/2017  . GAD (generalized anxiety disorder) 04/20/2017  . 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 Migraine without aura and without status migrainosus, not intractable 06/26/2016  . Seizure ([place]) 12/24/2015    Past Surgical History:  Procedure Laterality Date  . ABDOMINAL HYSTERECTOMY  2012  . COLONOSCOPY WITH PROPOFOL N/A 04/24/2019   Procedure: COLONOSCOPY WITH PROPOFOL;  Surgeon: Virgel Manifold, MD;  Location: [facility] ENDOSCOPY;  Service: Endoscopy;  Laterality: N/A;  . ESOPHAGOGASTRODUODENOSCOPY (EGD) WITH PROPOFOL N/A 04/24/2019   Procedure: ESOPHAGOGASTRODUODENOSCOPY (EGD) WITH PROPOFOL;  Surgeon: Virgel Manifold, MD;  Location: [facility] ENDOSCOPY;  Service: Endoscopy;  Laterality: N/A;     OB History   No obstetric history on file.     Family History  Problem Relation Age of Onset  . Hypertension Mother     Social History   Tobacco Use  . Smoking status: Former Smoker    Packs/day: 1.00    Types: Cigarettes    Quit date: 08/28/2013    Years since quitting: 6.3  . Smokeless tobacco: Never Used  . Tobacco comment: Quit Janurary 1st  Substance Use Topics  . Alcohol use:  No    Alcohol/week: 0.0 standard drinks  . Drug use: No    Home Medications Prior to Admission medications   Medication Sig Start Date End Date Taking? Authorizing Provider  acetaminophen (TYLENOL) 500 MG tablet Take 1,500 mg by mouth 3 (three) times daily as needed for headache (pain).    [provider]  albuterol (VENTOLIN HFA) 108 (90 Base) MCG/ACT inhaler Inhale 1-2 puffs into the lungs every 6 (six) hours as needed for wheezing or shortness of breath. 10/09/19   Pleas Koch, NP   budesonide-formoterol (SYMBICORT) 160-4.5 MCG/ACT inhaler Inhale 1 puff into the lungs 2 (two) times daily. 10/21/19   Pleas Koch, NP  escitalopram (LEXAPRO) 20 MG tablet Take 20 mg by mouth daily.    [provider]  fluticasone (FLONASE) 50 MCG/ACT nasal spray Place 1 spray into both nostrils 2 (two) times daily. 08/13/19   Pleas Koch, NP  omeprazole (PRILOSEC) 20 MG capsule Take 1 capsule (20 mg total) by mouth 2 (two) times daily. 07/16/19   Virgel Manifold, MD  ondansetron (ZOFRAN ODT) 4 MG disintegrating tablet Take 1 tablet (4 mg total) by mouth every 8 (eight) hours as needed for nausea or vomiting. 01/07/20   Pleas Koch, NP  PHENobarbital (LUMINAL) 97.2 MG tablet TAKE 2 TABLETS BY MOUTH AT BEDTIME 12/18/19   Penumalli, Earlean Polka, MD  predniSONE (DELTASONE) 20 MG tablet Take 2 tablets once daily for five days. 10/22/19   Pleas Koch, NP  rizatriptan (MAXALT-MLT) 10 MG disintegrating tablet Take 1 tablet (10 mg total) by mouth as needed for migraine. May repeat in 2 hours if needed 12/25/18   Penumalli, Earlean Polka, MD    Allergies    Asa buff (mag [buffered aspirin], Azithromycin, Ibuprofen, and Penicillins  Review of Systems   Review of Systems  Constitutional: Positive for chills and fever. Negative for diaphoresis.  HENT: Negative for congestion and sore throat.   Respiratory: Negative for cough and shortness of breath.   Cardiovascular: Negative for chest pain and leg swelling.  Gastrointestinal: Positive for abdominal pain and constipation.  Genitourinary: Negative for enuresis and frequency.  Musculoskeletal: Negative for back pain.  Skin: Negative for rash.  Neurological: Negative for dizziness and headaches.  Hematological: Does not bruise/bleed easily.    Physical Exam Updated Vital Signs BP (!) 127/93 (BP Location: Left Arm)   Pulse 93   Temp 97.8 F (36.6 C) (Oral)   Resp 15   Ht 5\' 5"  (1.651 m)   Wt 75 kg   SpO2 94%   BMI  27.51 kg/m   Physical Exam Vitals and nursing note reviewed.  Constitutional:      General: She is not in acute distress.    Appearance: She is not ill-appearing, toxic-appearing or diaphoretic.  HENT:     Head: Normocephalic and atraumatic.     Nose: No congestion.     Mouth/Throat:     Mouth: Mucous membranes are moist.     Pharynx: Oropharynx is clear.  Eyes:     General: No scleral icterus. Cardiovascular:     Rate and Rhythm: Normal rate and regular rhythm.     Pulses: Normal pulses.     Heart sounds: No murmur. No friction rub. No gallop.   Pulmonary:     Effort: No respiratory distress.     Breath sounds: No wheezing, rhonchi or rales.  Abdominal:     General: There is no distension.     Tenderness:  There is abdominal tenderness. There is no guarding.     Hernia: No hernia is present.  Musculoskeletal:        General: No swelling.  Skin:    General: Skin is warm and dry.     Findings: No rash.  Neurological:     Mental Status: She is alert.  Psychiatric:        Mood and Affect: Mood normal.     ED Results / Procedures / Treatments   Labs (all labs ordered are listed, but only abnormal results are displayed) Labs Reviewed  CBC WITH DIFFERENTIAL/PLATELET - Abnormal; Notable for the following components:      Result Value   RBC 5.19 (*)    Hemoglobin 16.5 (*)    HCT 48.1 (*)    All other components within normal limits  COMPREHENSIVE METABOLIC PANEL - Abnormal; Notable for the following components:   Glucose, Bld 106 (*)    AST 14 (*)    All other components within normal limits  URINALYSIS, ROUTINE W REFLEX MICROSCOPIC - Abnormal; Notable for the following components:   APPearance HAZY (*)    Specific Gravity, Urine >1.030 (*)    Bilirubin Urine SMALL (*)    Ketones, ur 15 (*)    All other components within normal limits  LIPASE, BLOOD  TROPONIN I (HIGH SENSITIVITY)  TROPONIN I (HIGH SENSITIVITY)    EKG   Radiology No results  found.  Procedures Procedures (including critical care time)  Medications Ordered in ED Medications  ondansetron (ZOFRAN) injection 4 mg (has no administration in time range)  acetaminophen (TYLENOL) tablet 650 mg (650 mg Oral Given 01/09/20 0003)    ED Course  I have reviewed the triage vital signs and the nursing notes.  Pertinent labs & imaging results that were available during my care of the patient were reviewed by me and considered in my medical decision making (see chart for details).    MDM Rules/Calculators/A&P                     CBC did not show any electrolyte abnormalities or acute kidney injury, troponin was normal, lipase also normal, CBC did not show any signs of infection.  I personally reviewed the patient's CT abdomen which did not show any abnormalities or bowel obstruction.  I have low suspicion that the patient's pain is from pancreatitis or bowel obstruction.  Patient's nausea was controlled with Zofran but she still had some mild epigastric pain upon palpation.  It is possible that the patient's discomfort is coming from GERD or stomach ulcer.  Patient was given a prescription for PPI and antiacid and should follow a bland diet for the next week.  It is recommended the patient follows up with her PCP within the next week for further evaluation and management of her symptoms. Final Clinical Impression(s) / ED Diagnoses Final diagnoses:  None    Rx / DC Orders ED Discharge Orders    None       Marcello Fennel, PA-C 01/09/20 [id number]    Merrily Pew, MD 01/09/20 0730

## 2020-01-12 ENCOUNTER — Ambulatory Visit: Payer: Self-pay | Admitting: Diagnostic Neuroimaging

## 2020-01-13 ENCOUNTER — Ambulatory Visit: Payer: Self-pay | Admitting: Diagnostic Neuroimaging

## 2020-01-16 ENCOUNTER — Ambulatory Visit: Payer: BC Managed Care – PPO | Admitting: Primary Care

## 2020-01-16 ENCOUNTER — Encounter: Payer: Self-pay | Admitting: Primary Care

## 2020-01-16 ENCOUNTER — Other Ambulatory Visit: Payer: Self-pay

## 2020-01-16 ENCOUNTER — Ambulatory Visit (INDEPENDENT_AMBULATORY_CARE_PROVIDER_SITE_OTHER)
Admission: RE | Admit: 2020-01-16 | Discharge: 2020-01-16 | Disposition: A | Discharge status: Home / Self Care | Payer: BC Managed Care – PPO | Source: Ambulatory Visit | Attending: Primary Care | Admitting: Primary Care

## 2020-01-16 VITALS — BP 126/82 | HR 88 | Temp 95.5°F | Ht 65.0 in | Wt 168.5 lb

## 2020-01-16 DIAGNOSIS — R1013 Epigastric pain: Secondary | ICD-10-CM | POA: Diagnosis not present

## 2020-01-16 DIAGNOSIS — R110 Nausea: Secondary | ICD-10-CM | POA: Diagnosis not present

## 2020-01-16 DIAGNOSIS — K5900 Constipation, unspecified: Secondary | ICD-10-CM | POA: Diagnosis not present

## 2020-01-16 MED ORDER — ONDANSETRON HCL 4 MG PO TABS: 4.0000 mg | ORAL_TABLET | Freq: Three times a day (TID) | ORAL | 0 refills | Status: DC | PRN

## 2020-01-16 MED ORDER — OMEPRAZOLE 20 MG PO CPDR: 20.0000 mg | DELAYED_RELEASE_CAPSULE | Freq: Every day | ORAL | 0 refills | Status: DC

## 2020-01-16 MED ORDER — FAMOTIDINE 20 MG PO TABS: 20.0000 mg | ORAL_TABLET | Freq: Two times a day (BID) | ORAL | 0 refills | Status: DC

## 2020-01-16 NOTE — Assessment & Plan Note (Addendum)
Acute for the last 2 weeks. Work-up in the emergency department without obvious cause for symptoms.  She was treated for esophageal reflux/gastric ulcer.  Little improvement since treated in the ED.  Refill provided for omeprazole and famotidine per patient request.  Refill also provided for Zofran, discussed use sparingly.  Given no bowel movement in 2 weeks we will obtain an x-ray of her abdomen.  She is passing gas which is reassuring.  Bowel sounds today normal.  Discussed use of MiraLAX daily to twice daily for the next week, she will call in a few days if no bowel movement.  Agreed to short-term FMLA with start date of May 9th and return date of May 31.  Will complete paperwork.

## 2020-01-16 NOTE — Assessment & Plan Note (Signed)
No bowel movement in 2 weeks. Bowel sounds normal on exam today, she is also passing gas.  Check abdominal plain films today to evaluate constipation.  Discussed to start MiraLAX once twice daily for the next week.  She will call Monday next week if she has not had a bowel movement.

## 2020-01-16 NOTE — Progress Notes (Signed)
Subjective:    Patient ID: [name], female    DOB: 11-Jan-1971, 49 y.o.   MRN: [id number]  HPI  This visit occurred during the SARS-CoV-2 public health emergency.  Safety protocols were in place, including screening questions prior to the visit, additional usage of staff PPE, and extensive cleaning of exam room while observing appropriate contact time as indicated for disinfecting solutions.   [name] is a 49 year old female   She presented to [facility] ED on 01/09/20 with reports of epigastric pain nausea, and vomiting that began five days prior. She was evaluated virtually by myself two days prior for same symptoms, including altered taste and cough.  During her stay in the ED she underwent lab work up including CBC, Lipase, troponin which was negative. She underwent CT abdomen/pelcis which was negative. Given negative work up it was suspected that her symptoms were secondary to GERD or gastric ulcer so she was provided with a prescription for PPI and antiacid and told to follow up with PCP.  Since her ED visit she's been taking omeprazole, Zofran and famotidine along with her other prescribed medications. She continues to notice nausea and epigastric pain intermittently. The epigastric pain work her from sleep this morning. She is no longer vomiting.   Her last bowel movement was 2 weeks ago. She's not tried anything OTC for constipation. She's on a bland diet. She has been out of of work since May 9th due to pain, fatigue, and nausea, she would like to return May 31st. 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  She has FMLA paperwork with her today.  BP Readings from Last 3 Encounters:  01/16/20 126/82  01/09/20 99/76  10/09/19 122/82     Review of Systems  Constitutional: Positive for fatigue. Negative for fever.  Gastrointestinal: Positive for abdominal pain, constipation and nausea. Negative for diarrhea and vomiting.       Past Medical History:  Diagnosis  Date  . Asthma    worse when younger  . GERD (gastroesophageal reflux disease)   . Migraine    1-2x/month  . PONV (postoperative nausea and vomiting)   . Seizure ([place])   . Vertigo    no episodes for 7 yrs     Social History   Socioeconomic History  . Marital status: Widowed    Spouse name: [name]  . Number of children: 0  . Years of education: 10th  . Highest education level: Not on file  Occupational History    Employer: [id number]  Tobacco Use  . Smoking status: Former Smoker    Packs/day: 1.00    Types: Cigarettes    Quit date: 08/28/2013    Years since quitting: 6.3  . Smokeless tobacco: Never Used  . Tobacco comment: Quit Janurary 1st  Substance and Sexual Activity  . Alcohol use: No    Alcohol/week: 0.0 standard drinks  . Drug use: No  . Sexual activity: Not on file  Other Topics Concern  . Not on file  Social History Narrative   Patient lives at home with spouse.   No children.   Works at [employer].   Enjoys going to [employer], going to estate sells.   Right handed   Caffeine: 1 Dr. [name] every morning   Social Determinants of Health   Financial Resource Strain:   . Difficulty of Paying Living Expenses:   Food Insecurity:   . Worried About Charity fundraiser in the Last Year:   .  Ran Out of Food in the Last Year:   Transportation Needs:   . Film/video editor (Medical):   Marland Kitchen Lack of Transportation (Non-Medical):   Physical Activity:   . Days of Exercise per Week:   . Minutes of Exercise per Session:   Stress:   . Feeling of Stress :   Social Connections:   . Frequency of Communication with Friends and Family:   . Frequency of Social Gatherings with Friends and Family:   . Attends Religious Services:   . Active Member of Clubs or Organizations:   . Attends Archivist Meetings:   Marland Kitchen Marital Status:   Intimate Partner Violence:   . Fear of Current or Ex-Partner:   . Emotionally Abused:   Marland Kitchen Physically Abused:   . Sexually Abused:      Past Surgical History:  Procedure Laterality Date  . ABDOMINAL HYSTERECTOMY  2012  . COLONOSCOPY WITH PROPOFOL N/A 04/24/2019   Procedure: COLONOSCOPY WITH PROPOFOL;  Surgeon: Virgel Manifold, MD;  Location: [facility] ENDOSCOPY;  Service: Endoscopy;  Laterality: N/A;  . ESOPHAGOGASTRODUODENOSCOPY (EGD) WITH PROPOFOL N/A 04/24/2019   Procedure: ESOPHAGOGASTRODUODENOSCOPY (EGD) WITH PROPOFOL;  Surgeon: Virgel Manifold, MD;  Location: [facility] ENDOSCOPY;  Service: Endoscopy;  Laterality: N/A;    Family History  Problem Relation Age of Onset  . Hypertension Mother     Allergies  Allergen Reactions  . Asa Buff (Mag [Buffered Aspirin] Other (See Comments)    Stomach pain  . Azithromycin Other (See Comments)    GI problems  . Ibuprofen Other (See Comments)    GI upset  . Penicillins Other (See Comments)    Unknown childhood allergic reaction Has patient had a PCN reaction causing immediate rash, facial/tongue/throat swelling, SOB or lightheadedness with hypotension: Unknown Has patient had a PCN reaction causing severe rash involving mucus membranes or skin necrosis: Unknown Has patient had a PCN reaction that required hospitalization: Unknown Has patient had a PCN reaction occurring within the last 10 years: No If all of the above answers are "NO", then may proceed with Cephalosporin use.    Current Outpatient Medications on File Prior to Visit  Medication Sig Dispense Refill  . albuterol (VENTOLIN HFA) 108 (90 Base) MCG/ACT inhaler Inhale 1-2 puffs into the lungs every 6 (six) hours as needed for wheezing or shortness of breath. 18 g 0  . escitalopram (LEXAPRO) 20 MG tablet Take 20 mg by mouth daily.    Marland Kitchen PHENobarbital (LUMINAL) 97.2 MG tablet TAKE 2 TABLETS BY MOUTH AT BEDTIME (Patient taking differently: Take 194.4 mg by mouth daily. ) 60 tablet 5  . predniSONE (DELTASONE) 20 MG tablet Take 2 tablets once daily for five days. 10 tablet 0  . rizatriptan (MAXALT-MLT) 10 MG  disintegrating tablet Take 1 tablet (10 mg total) by mouth as needed for migraine. May repeat in 2 hours if needed 9 tablet 11  . budesonide-formoterol (SYMBICORT) 160-4.5 MCG/ACT inhaler Inhale 1 puff into the lungs 2 (two) times daily. (Patient not taking: Reported on 01/09/2020) 1 Inhaler 0  . fluticasone (FLONASE) 50 MCG/ACT nasal spray Place 1 spray into both nostrils 2 (two) times daily. (Patient not taking: Reported on 01/09/2020) 16 g 0   No current facility-administered medications on file prior to visit.    BP 126/82   Pulse 88   Temp (!) 95.5 F (35.3 C) (Temporal)   Ht 5\' 5"  (1.651 m)   Wt 168 lb 8 oz (76.4 kg)   SpO2 98%  BMI 28.04 kg/m    Objective:   Physical Exam  Constitutional: She appears well-nourished.  Cardiovascular: Normal rate.  Respiratory: Effort normal.  GI: Soft. Bowel sounds are normal. There is abdominal tenderness in the epigastric area. There is no guarding.    Skin: Skin is warm and dry.           Assessment & Plan:

## 2020-01-16 NOTE — Patient Instructions (Signed)
Complete xray(s) prior to leaving today. I will notify you of your results once received.  Use the ondansetron sparingly as needed for nausea.   You can take the famotidine once or twice daily for reflux/stomach pain. Take the omeprazole once daily for reflux/stomach pain.  Start Miralax. Take this once or twice daily with 8 ounces of liquid everyday for the next week.  Call me Monday next week if you've not had a bowel movement.   It was a pleasure to see you today!

## 2020-01-19 ENCOUNTER — Telehealth: Payer: Self-pay | Admitting: Primary Care

## 2020-01-19 NOTE — Telephone Encounter (Signed)
Called to let pt know FMLA ppw has been completed. Mother-in-law answered and said she will have her give me a call back. Just need to find out if she wants it faxed or if she wants to pick it up to turn in herself.

## 2020-01-19 NOTE — Telephone Encounter (Signed)
Patient returned call.  Patient wants paperwork faxed.

## 2020-01-21 NOTE — Telephone Encounter (Signed)
Called pt to let her know this has been faxed and a copy was left up front for pickup. No answer, left voicemail.

## 2020-01-24 DIAGNOSIS — K5900 Constipation, unspecified: Secondary | ICD-10-CM

## 2020-01-24 DIAGNOSIS — R1013 Epigastric pain: Secondary | ICD-10-CM

## 2020-01-27 ENCOUNTER — Ambulatory Visit: Payer: Self-pay | Admitting: Diagnostic Neuroimaging

## 2020-01-27 ENCOUNTER — Telehealth: Payer: Self-pay | Admitting: Gastroenterology

## 2020-01-27 NOTE — Telephone Encounter (Signed)
Hey Dr. Tarri Glenn- this patient is being referred to [place] for abdominal pain and constipation. She is wanting to transfer care from [place] to [place]. She does not like her current doctor. Her records are in [place] as she was seen last in November 2020. Please advise on scheduling. Thank you!

## 2020-01-27 NOTE — Telephone Encounter (Signed)
Ok to schedule with Dr Tarri Glenn

## 2020-01-27 NOTE — Telephone Encounter (Signed)
That is fine with me.

## 2020-01-28 NOTE — Telephone Encounter (Signed)
Left message for patient to call back and schedule.

## 2020-01-29 ENCOUNTER — Encounter: Payer: Self-pay | Admitting: Gastroenterology

## 2020-02-09 ENCOUNTER — Encounter: Payer: Self-pay | Admitting: Diagnostic Neuroimaging

## 2020-02-09 ENCOUNTER — Other Ambulatory Visit: Payer: Self-pay

## 2020-02-09 ENCOUNTER — Ambulatory Visit: Payer: BC Managed Care – PPO | Admitting: Diagnostic Neuroimaging

## 2020-02-09 VITALS — BP 108/69 | HR 92 | Ht 65.0 in | Wt 175.2 lb

## 2020-02-09 DIAGNOSIS — G40B09 Juvenile myoclonic epilepsy, not intractable, without status epilepticus: Secondary | ICD-10-CM | POA: Diagnosis not present

## 2020-02-09 DIAGNOSIS — G43009 Migraine without aura, not intractable, without status migrainosus: Secondary | ICD-10-CM | POA: Diagnosis not present

## 2020-02-09 MED ORDER — PHENOBARBITAL 97.2 MG PO TABS: 194.4000 mg | ORAL_TABLET | Freq: Every day | ORAL | 4 refills | Status: DC

## 2020-02-09 NOTE — Progress Notes (Signed)
Chief Complaint  Patient presents with  . Migraine    rm 4, one year FU "migraines doing well, just get headaches; no sieizures"  . Seizures     History of Present Illness:  UPDATE (02/09/20, VRP): Since last visit, doing about the same. Daily low grade HA, but rare migraine. No longer on ajovy (hard to take shots on her own). Symptoms are stable. No seizures.   PRIOR HPI (12/25/18): - doing well; no seizures - HA are improving on ajovy - unfortunately patient's husband deceased now (from cancer) - overall stable  UPDATE (12/31/17, VRP): Since last visit, doing the same with HA (daily basis). Tried TPX, but HA worse. Rizatriptan helps. Tried lower PB, but had muscle spasms / myoclonus, but no grand mal seizures.   UPDATE (09/02/16, VRP): Since last visit, doing well. No seizures. Tolerating meds. No alleviating or aggravating factors. In last 3 weeks has had more HA / migraines (daily). Rizatriptan and tylenol not helping that much. Had cut down tylenol for a while, but now back up to 6 tabs per day. Drinking 1 soda per day. Eating 3 times per day. Sleep is a little better.   UPDATE (09/02/16, VRP): Since last visit, doing well --> no seizures. Tolerating meds. No alleviating or aggravating factors. Has had 3 weeks of consistent headaches. Taking up to tylenol x 6 tabs per day.   UPDATE 06/26/16: Since last visit, doing well. No seizures. Sleep is improved on her own. Tried on amitriptyline, but HA felt worse, so she stopped it. Having 2-3 migraine per month, well controlled with rizatriptan.  UPDATE 12/24/15: Since last visit, no seizures. Still with daily headaches. Also with 1 severe migraine per month. Eating 2 x per day. Soda (1 per day). Still with 6 tylenol tabs per day. Amitriptyline helped some in the beginning, but seems to be less effective now.   UPDATE 10/22/15: Since last visit, no seizures. Has cut down soda (now 1-2 per day), has quit smoking, and now exercising some.  Still only eats 1 meal per day. Still taking 5-9 tabs tylenol on daily basis. Has 1 migraine per month (pounding global HA, sens to light / sound). Also with daily lower level headaches. Still poor sleep quality.   UPDATE 10/21/14: Since last visit, no seizures. C/o daily headaches, and taking daily Tylenol. She has interrupted sleep at nighttime. She continues to drink 5-6 Dr. [name] cans per day. She eats once per day. She has increased stress related to work. Fortunately patient has been able to quit smoking cigarettes since 08/28/2014.  UPDATE 10/21/13: Since last visit, no new events or seizures. Now has [facility], but no PCP. Tolerating PB 97.2mg  tabs (2 tabs qAM).   UPDATE 05/29/12: No seizure. No PCP. No insurance.   UPDATE 12/22/10: No seizure since age 1's.  Tolerating PB, and wants to stay on it.  Having 1 migraine every 1-2 years (global, severe, pressure, photophobia, phonophobia).  Usually midrin helps.  Never tried triptan.  Also using tylenol for tension HA.  Last migraine Aug 2011.  PRIOR HPI (12/15/08, Dr. Gaynell Face): "Ahniyah is a 49 year old married woman with a long-standing history of juvenile myoclonic epilepsy.  She has not had seizures for years.  The only manifestation of her symptoms has been generalized tonic-clonic seizures.  She has taken and tolerated Phenobarbital without significant side effects. Patient has daily headaches that I think are more tension type in nature than migraine.  Become on later in the day except  when she has stress. Since her last visit, she had hysterectomy and oophorectomy for removal of an ovarian cyst.  She was noted to have scarred fallopian tubes at the time of operation."     Observations/Objective:  GENERAL EXAM/CONSTITUTIONAL: Vitals:  Vitals:   02/09/20 1535  BP: 108/69  Pulse: 92  Weight: 175 lb 3.2 oz (79.5 kg)  Height: 5\' 5"  (1.651 m)     Body mass index is 29.15 kg/m. Wt Readings from Last 3 Encounters:  02/09/20  175 lb 3.2 oz (79.5 kg)  01/16/20 168 lb 8 oz (76.4 kg)  01/08/20 165 lb 5.5 oz (75 kg)     Patient is in no distress; well developed, nourished and groomed; neck is supple  CARDIOVASCULAR:  Examination of carotid arteries is normal; no carotid bruits  Regular rate and rhythm, no murmurs  Examination of peripheral vascular system by observation and palpation is normal  EYES:  Ophthalmoscopic exam of optic discs and posterior segments is normal; no papilledema or hemorrhages  No exam data present  MUSCULOSKELETAL:  Gait, strength, tone, movements noted in Neurologic exam below  NEUROLOGIC: MENTAL STATUS:  No flowsheet data found.  awake, alert, oriented to person, place and time  recent and remote memory intact  normal attention and concentration  language fluent, comprehension intact, naming intact  fund of knowledge appropriate  CRANIAL NERVE:   2nd - no papilledema on fundoscopic exam  2nd, 3rd, 4th, 6th - pupils equal and reactive to light, visual fields full to confrontation, extraocular muscles intact, no nystagmus  5th - facial sensation symmetric  7th - facial strength symmetric  8th - hearing intact  9th - palate elevates symmetrically, uvula midline  11th - shoulder shrug symmetric  12th - tongue protrusion midline  MOTOR:   normal bulk and tone, full strength in the BUE, BLE  RIGHT 5TH DIGIT CONTRACTURE  LEFT HAND POST-SURGICAL  SENSORY:   normal and symmetric to light touch  COORDINATION:   finger-nose-finger, fine finger movements normal  REFLEXES:   deep tendon reflexes TRACE and symmetric  GAIT/STATION:   narrow based gait    Assessment and Plan:  49 y.o. female here with seizure d/o (JME) since childhood, but no seizure since 20.  Tolerating PB and wants to stay on it.  Also with chronic daily headache (tension) related to excess caffeine, poor nutrition, increased stress and poor sleep pattern. Now improved.     Dx:  1. Migraine without aura and without status migrainosus, not intractable   2. Juvenile myoclonic epilepsy, not intractable, without status epilepticus ([place])       PLAN:  HEADACHES / MIGRAINES (tension daily; rare migraines) - tried and failed topiramate, rizatriptan, amitriptyline, ajovy - continue to optimize nutrition, sleep hygeine, caffeine reduction and daily exercise  JME/SEIZURES - continue phenobarbital 97.2mg  (2 tabs daily); could not tolerate reduced dose  STRESS/ANXIETY - continue lexapro  Meds ordered this encounter  Medications  . PHENobarbital (LUMINAL) 97.2 MG tablet    Sig: Take 2 tablets (194.4 mg total) by mouth daily.    Dispense:  180 tablet    Refill:  4     Follow Up Instructions:  - Return in about 1 year (around 02/08/2021) for with NP (Amy Lomax).       [name], MD 0/25/8527, 7:82 PM Certified in Neurology, Neurophysiology and Neuroimaging  [facility] [address], [place], [place] [address] [phone]

## 2020-03-05 ENCOUNTER — Other Ambulatory Visit: Payer: Self-pay | Admitting: Primary Care

## 2020-03-05 DIAGNOSIS — R1013 Epigastric pain: Secondary | ICD-10-CM

## 2020-03-08 NOTE — Telephone Encounter (Signed)
Both Rx last prescribed on 01/16/2020 Last OV (acute) with Allie Bossier on 01/16/2020 Patient has an appointment with GI on 03/19/2020

## 2020-03-08 NOTE — Telephone Encounter (Signed)
Refills sent to pharmacy. 

## 2020-03-19 ENCOUNTER — Ambulatory Visit: Payer: BC Managed Care – PPO | Admitting: Gastroenterology

## 2020-04-25 DIAGNOSIS — Z01419 Encounter for gynecological examination (general) (routine) without abnormal findings: Secondary | ICD-10-CM

## 2020-04-26 ENCOUNTER — Other Ambulatory Visit: Payer: Self-pay | Admitting: Primary Care

## 2020-04-26 DIAGNOSIS — Z1231 Encounter for screening mammogram for malignant neoplasm of breast: Secondary | ICD-10-CM

## 2020-05-07 HISTORY — PX: HAND SURGERY: SHX662

## 2020-05-09 NOTE — Telephone Encounter (Signed)
Will one of you ladies take a look at this GYN referral?

## 2020-05-17 ENCOUNTER — Ambulatory Visit: Payer: BC Managed Care – PPO | Admitting: Gastroenterology

## 2020-05-17 ENCOUNTER — Encounter: Payer: Self-pay | Admitting: Gastroenterology

## 2020-05-17 VITALS — BP 112/72 | HR 98 | Ht 65.0 in | Wt 172.1 lb

## 2020-05-17 DIAGNOSIS — K5900 Constipation, unspecified: Secondary | ICD-10-CM | POA: Diagnosis not present

## 2020-05-17 DIAGNOSIS — R109 Unspecified abdominal pain: Secondary | ICD-10-CM

## 2020-05-17 MED ORDER — LINACLOTIDE 145 MCG PO CAPS: 145.0000 ug | ORAL_CAPSULE | Freq: Every day | ORAL | 3 refills | Status: DC

## 2020-05-17 NOTE — Progress Notes (Signed)
Referring Provider: Pleas Koch, NP Primary Care Physician:  Pleas Koch, NP  Reason for Consultation:  Abdominal pain and constipation   IMPRESSION:  - Constipation with failed attempted at colonoscopy 2020 due to prep intolerance - Odynophagia with globus with history of LA Class A reflux esophagitis on EGD 2020 - Epigastric abdominal pain - may due to reflux after review EGD and CT scan  PLAN: - High fiber diet, drink at least 64 ounces of water daily, exercise regularly - Bowel purge and then start Linzess 145 mcg daily - Consider colonoscopy in the future - Continue famotidine and omeprazole    Please see the "Patient Instructions" section for addition details about the plan.  HPI: [name] is a 49 y.o. female referred by NP Carlis Abbott. Previously seen and evaluated by Dr. Bonna Gains, last 06/2019. The history is obtained through the patient and review of her electronic health record. She has anxiety, asthma, daily headaches and a history of seizures (last 1990). She works at [employer].   She has several ongoing GI concerns:  (1) Reflux and odynophagia x 1 year with associated, intermittent globus.  Omeprazole BID provided some improvement but it was not complete or sustained. Improves with extension of her neck.  Sips of water provide some relief.   (2) Intermittent, epigastric abdominal pain that developed over the last year. Worse in May than at any other time. Knot that its at the xiphoid process. Not caused or relieved by movement, eating, or defecation. Out of work in May for 3 weeks due to pain. She has tried Nurse, adult with some relief.   (3) Constipation. Often goes for more than a week without a bowel movement. Associated lower abdominal pain that is relieved by defecation. She wonders if she might have IBS. Sense of complete evacuation. No blood or mucous.   Unable to have a colonoscopy because she could not keep the bowel prep down. Bought two  different preps - costing $100 - without success.   Thought she had a small hiatal hernia, although no one seems to be able to find it now.  Drinks a lot of caffeeine.  Husband died of stage IV colon cancer last year.  Her recent GI symptoms are keeping her from doing the normal activities of life.  Has gained nearly 50 pounds since her husband died. Quit smoking 6 years ago. Had some weight gain after that.   Notes that she does not like to take medications.   Endoscopic evaluation with Dr. Mariann Laster:  Poor prep on colonoscopy 04/24/2019 EGD 04/24/19: - LA Grade A reflux esophagitis. - White nummular lesions in esophageal mucosa. Brushings performed. Biopsies showed mild acanthosis.  - Normal stomach. Normal gastric biopsies.  - Normal duodenum.  CT of the abd/pelvis 01/09/20 showed no acute findings.  Sister with IBS. No known family history of colon cancer or polyps. No family history of uterine/endometrial cancer, pancreatic cancer or gastric/stomach cancer.  Past Medical History:  Diagnosis Date  . Asthma    worse when younger  . GERD (gastroesophageal reflux disease)   . Migraine    1-2x/month  . Persistent cough for 3 weeks or longer 10/09/2019  . PONV (postoperative nausea and vomiting)   . Seizure ([place])   . Vertigo    no episodes for 7 yrs    Past Surgical History:  Procedure Laterality Date  . ABDOMINAL HYSTERECTOMY  2012  . COLONOSCOPY WITH PROPOFOL N/A 04/24/2019   Procedure: COLONOSCOPY WITH PROPOFOL;  Surgeon:  Virgel Manifold, MD;  Location: [facility] ENDOSCOPY;  Service: Endoscopy;  Laterality: N/A;  . ESOPHAGOGASTRODUODENOSCOPY (EGD) WITH PROPOFOL N/A 04/24/2019   Procedure: ESOPHAGOGASTRODUODENOSCOPY (EGD) WITH PROPOFOL;  Surgeon: Virgel Manifold, MD;  Location: [facility] ENDOSCOPY;  Service: Endoscopy;  Laterality: N/A;  . HAND SURGERY Right 05/07/2020   trigger release    Current Outpatient Medications  Medication Sig Dispense Refill  . Acetaminophen  (TYLENOL PO) Take 2 tablets by mouth as needed.    Marland Kitchen albuterol (VENTOLIN HFA) 108 (90 Base) MCG/ACT inhaler Inhale 1-2 puffs into the lungs every 6 (six) hours as needed for wheezing or shortness of breath. 18 g 0  . budesonide-formoterol (SYMBICORT) 160-4.5 MCG/ACT inhaler Inhale 1 puff into the lungs 2 (two) times daily. 1 Inhaler 0  . escitalopram (LEXAPRO) 20 MG tablet Take 20 mg by mouth daily.    . fluticasone (FLONASE) 50 MCG/ACT nasal spray Place 1 spray into both nostrils 2 (two) times daily. (Patient taking differently: Place 1 spray into both nostrils as needed. ) 16 g 0  . ondansetron (ZOFRAN) 4 MG tablet Take 1 tablet (4 mg total) by mouth every 8 (eight) hours as needed for nausea or vomiting. 20 tablet 0  . PHENobarbital (LUMINAL) 97.2 MG tablet Take 2 tablets (194.4 mg total) by mouth daily. 180 tablet 4  . rizatriptan (MAXALT-MLT) 10 MG disintegrating tablet Take 1 tablet (10 mg total) by mouth as needed for migraine. May repeat in 2 hours if needed 9 tablet 11  . famotidine (PEPCID) 20 MG tablet Take 1 tablet (20 mg total) by mouth 2 (two) times daily. For heartburn. (Patient not taking: Reported on 05/17/2020) 60 tablet 0  . omeprazole (PRILOSEC) 20 MG capsule Take 1 capsule (20 mg total) by mouth daily. For heartburn. (Patient not taking: Reported on 05/17/2020) 30 capsule 0   No current facility-administered medications for this visit.    Allergies as of 05/17/2020 - Review Complete 05/17/2020  Allergen Reaction Noted  . Asa buff (mag [buffered aspirin] Other (See Comments) 10/14/2010  . Azithromycin Other (See Comments) 06/19/2011  . Ibuprofen Other (See Comments) 10/14/2010  . Penicillins Other (See Comments) 10/14/2010    Family History  Problem Relation Age of Onset  . Hypertension Mother   . Colon cancer Neg Hx   . Esophageal cancer Neg Hx     Social History   Socioeconomic History  . Marital status: Widowed    Spouse name: [name]  . Number of children: 0  .  Years of education: 10th  . Highest education level: Not on file  Occupational History    Employer: [employer]  Tobacco Use  . Smoking status: Former Smoker    Packs/day: 1.00    Types: Cigarettes    Quit date: 08/28/2013    Years since quitting: 6.7  . Smokeless tobacco: Never Used  . Tobacco comment: Quit Janurary 1st  Vaping Use  . Vaping Use: Never used  Substance and Sexual Activity  . Alcohol use: No    Alcohol/week: 0.0 standard drinks  . Drug use: No  . Sexual activity: Not on file  Other Topics Concern  . Not on file  Social History Narrative   Patient lives alone   No children.   Works at [employer].   Enjoys going to [employer], going to estate sells.   Right handed   Caffeine: 1 Dr. [name] every morning   Social Determinants of Health   Financial Resource Strain:   . Difficulty of Paying Living  Expenses: Not on file  Food Insecurity:   . Worried About Charity fundraiser in the Last Year: Not on file  . Ran Out of Food in the Last Year: Not on file  Transportation Needs:   . Lack of Transportation (Medical): Not on file  . Lack of Transportation (Non-Medical): Not on file  Physical Activity:   . Days of Exercise per Week: Not on file  . Minutes of Exercise per Session: Not on file  Stress:   . Feeling of Stress : Not on file  Social Connections:   . Frequency of Communication with Friends and Family: Not on file  . Frequency of Social Gatherings with Friends and Family: Not on file  . Attends Religious Services: Not on file  . Active Member of Clubs or Organizations: Not on file  . Attends Archivist Meetings: Not on file  . Marital Status: Not on file  Intimate Partner Violence:   . Fear of Current or Ex-Partner: Not on file  . Emotionally Abused: Not on file  . Physically Abused: Not on file  . Sexually Abused: Not on file    Review of Systems: 12 system ROS is negative except as noted above with the additions of anxiety, cough, headaches,  night sweats, insomnia, and excessive thirst.   Physical Exam: General:   Alert,  well-nourished, pleasant and cooperative in NAD Head:  Normocephalic and atraumatic. Eyes:  Sclera clear, no icterus.   Conjunctiva pink. Ears:  Normal auditory acuity. Nose:  No deformity, discharge,  or lesions. Mouth:  No deformity or lesions.   Neck:  Supple; no masses or thyromegaly. Lungs:  Clear throughout to auscultation.   No wheezes. Heart:  Regular rate and rhythm; no murmurs. Abdomen:  Soft, nontender, nondistended, normal bowel sounds, no rebound or guarding. No hepatosplenomegaly.   Rectal:  Deferred  Msk:  Symmetrical. No boney deformities LAD: No inguinal or umbilical LAD Extremities:  No clubbing or edema. Neurologic:  Alert and  oriented x4;  grossly nonfocal Skin:  Intact without significant lesions or rashes. Psych:  Alert and cooperative. Normal mood and affect.     Ike Maragh L. Tarri Glenn, MD, MPH 05/17/2020, 2:26 PM

## 2020-05-17 NOTE — Patient Instructions (Addendum)
If you are age 51 or older, your body mass index should be between 23-30. Your Body mass index is 28.64 kg/m. If this is out of the aforementioned range listed, please consider follow up with your Primary Care Provider.  If you are age 55 or younger, your body mass index should be between 19-25. Your Body mass index is 28.64 kg/m. If this is out of the aformentioned range listed, please consider follow up with your Primary Care Provider.   I recommend a bowel purge to clean out your bowels. Please do the following: Purchase a bottle of Miralax over the counter as well as a box of 5 mg dulcolax tablets. Take 4 dulcolax tablets. Wait 1 hour. You will then drink 6-8 capfuls of Miralax mixed in an adequate amount of water/juice/gatorade (you may choose which of these liquids to drink) over the next 2-3 hours. You should expect results within 1 to 6 hours after completing the bowel purge.  After your bowel purge, I would like to start Linzess 145 mcg daily.   We have sent the following medications to your pharmacy for you to pick up at your convenience:  START: Linzess 145 mcg once daily.  Thank you for entrusting me with your care and choosing [facility].  Dr Tarri Glenn

## 2020-05-20 ENCOUNTER — Telehealth: Payer: Self-pay | Admitting: Primary Care

## 2020-05-20 DIAGNOSIS — R9389 Abnormal findings on diagnostic imaging of other specified body structures: Secondary | ICD-10-CM

## 2020-05-20 NOTE — Telephone Encounter (Addendum)
-----   Message from Pleas Koch, NP sent at 10/22/2019  2:57 PM EST ----- Regarding: Repeat CT Due Patient needs repeat CT or MRI for T5 lesion. See CT chest from February 2021. If she willing to complete?

## 2020-05-21 NOTE — Telephone Encounter (Signed)
Noted, CT order placed.

## 2020-05-21 NOTE — Telephone Encounter (Signed)
She would like to have done.

## 2020-05-24 ENCOUNTER — Other Ambulatory Visit: Payer: Self-pay

## 2020-05-24 ENCOUNTER — Ambulatory Visit
Admission: RE | Admit: 2020-05-24 | Discharge: 2020-05-24 | Disposition: A | Discharge status: Home / Self Care | Payer: BC Managed Care – PPO | Source: Ambulatory Visit | Attending: Primary Care | Admitting: Primary Care

## 2020-05-24 DIAGNOSIS — Z1231 Encounter for screening mammogram for malignant neoplasm of breast: Secondary | ICD-10-CM

## 2020-05-26 ENCOUNTER — Other Ambulatory Visit: Payer: Self-pay

## 2020-05-26 ENCOUNTER — Encounter: Payer: Self-pay | Admitting: Obstetrics and Gynecology

## 2020-05-26 ENCOUNTER — Other Ambulatory Visit (HOSPITAL_COMMUNITY)
Admission: RE | Admit: 2020-05-26 | Discharge: 2020-05-26 | Disposition: A | Discharge status: Home / Self Care | Payer: BC Managed Care – PPO | Source: Ambulatory Visit | Attending: Obstetrics and Gynecology | Admitting: Obstetrics and Gynecology

## 2020-05-26 ENCOUNTER — Ambulatory Visit (INDEPENDENT_AMBULATORY_CARE_PROVIDER_SITE_OTHER): Payer: BC Managed Care – PPO | Admitting: Obstetrics and Gynecology

## 2020-05-26 VITALS — BP 125/86 | HR 94 | Wt 175.6 lb

## 2020-05-26 DIAGNOSIS — N898 Other specified noninflammatory disorders of vagina: Secondary | ICD-10-CM | POA: Diagnosis not present

## 2020-05-26 DIAGNOSIS — Z01419 Encounter for gynecological examination (general) (routine) without abnormal findings: Secondary | ICD-10-CM | POA: Insufficient documentation

## 2020-05-26 DIAGNOSIS — Z23 Encounter for immunization: Secondary | ICD-10-CM

## 2020-05-26 DIAGNOSIS — R309 Painful micturition, unspecified: Secondary | ICD-10-CM

## 2020-05-26 DIAGNOSIS — R3915 Urgency of urination: Secondary | ICD-10-CM | POA: Diagnosis not present

## 2020-05-26 LAB — POCT URINE QUALITATIVE DIPSTICK BLOOD: Blood, UA: POSITIVE

## 2020-05-26 NOTE — Progress Notes (Signed)
GYNECOLOGY ANNUAL PREVENTATIVE CARE ENCOUNTER NOTE  History:     [name] is a 49 y.o. G18 female here for a routine annual gynecologic exam.  Current complaints: urgency, frequency, some incontinence. + vaginal odor/urinary odor. This is a new problem. She lost her husband to colon CA 2 years ago.   Denies abnormal vaginal bleeding, discharge, pelvic pain, problems with intercourse or other gynecologic concerns.    Gynecologic History No LMP recorded. Patient has had a hysterectomy. 2008; specimen(s) obtained, Uterus and cervix, right fallopian tube and right ovary Contraception: status post hysterectomy Last Pap: NA Last mammogram: 9/21. Results were: normal  Obstetric History OB History  No obstetric history on file.    Past Medical History:  Diagnosis Date  . Asthma    worse when younger  . GERD (gastroesophageal reflux disease)   . Migraine    1-2x/month  . Persistent cough for 3 weeks or longer 10/09/2019  . PONV (postoperative nausea and vomiting)   . Seizure ([place])   . Vertigo    no episodes for 7 yrs    Past Surgical History:  Procedure Laterality Date  . ABDOMINAL HYSTERECTOMY  2012  . COLONOSCOPY WITH PROPOFOL N/A 04/24/2019   Procedure: COLONOSCOPY WITH PROPOFOL;  Surgeon: Virgel Manifold, MD;  Location: [facility];  Service: Endoscopy;  Laterality: N/A;  . ESOPHAGOGASTRODUODENOSCOPY (EGD) WITH PROPOFOL N/A 04/24/2019   Procedure: ESOPHAGOGASTRODUODENOSCOPY (EGD) WITH PROPOFOL;  Surgeon: Virgel Manifold, MD;  Location: [facility] ENDOSCOPY;  Service: Endoscopy;  Laterality: N/A;  . HAND SURGERY Right 05/07/2020   trigger release    Current Outpatient Medications on File Prior to Visit  Medication Sig Dispense Refill  . albuterol (VENTOLIN HFA) 108 (90 Base) MCG/ACT inhaler Inhale 1-2 puffs into the lungs every 6 (six) hours as needed for wheezing or shortness of breath. 18 g 0  . budesonide-formoterol (SYMBICORT) 160-4.5 MCG/ACT inhaler  Inhale 1 puff into the lungs 2 (two) times daily. 1 Inhaler 0  . escitalopram (LEXAPRO) 20 MG tablet Take 20 mg by mouth daily.    Marland Kitchen linaclotide (LINZESS) 145 MCG CAPS capsule Take 1 capsule (145 mcg total) by mouth daily before breakfast. 30 capsule 3  . PHENobarbital (LUMINAL) 97.2 MG tablet Take 2 tablets (194.4 mg total) by mouth daily. 180 tablet 4  . rizatriptan (MAXALT-MLT) 10 MG disintegrating tablet Take 1 tablet (10 mg total) by mouth as needed for migraine. May repeat in 2 hours if needed 9 tablet 11  . Acetaminophen (TYLENOL PO) Take 2 tablets by mouth as needed. (Patient not taking: Reported on 05/26/2020)    . famotidine (PEPCID) 20 MG tablet Take 1 tablet (20 mg total) by mouth 2 (two) times daily. For heartburn. (Patient not taking: Reported on 05/17/2020) 60 tablet 0  . fluticasone (FLONASE) 50 MCG/ACT nasal spray Place 1 spray into both nostrils 2 (two) times daily. (Patient not taking: Reported on 05/26/2020) 16 g 0  . omeprazole (PRILOSEC) 20 MG capsule Take 1 capsule (20 mg total) by mouth daily. For heartburn. (Patient not taking: Reported on 05/17/2020) 30 capsule 0  . ondansetron (ZOFRAN) 4 MG tablet Take 1 tablet (4 mg total) by mouth every 8 (eight) hours as needed for nausea or vomiting. 20 tablet 0   No current facility-administered medications on file prior to visit.    Allergies  Allergen Reactions  . Asa Buff (Mag [Buffered Aspirin] Other (See Comments)    Stomach pain  . Azithromycin Other (See Comments)  GI problems  . Ibuprofen Other (See Comments)    GI upset  . Penicillins Other (See Comments)    Unknown childhood allergic reaction Has patient had a PCN reaction causing immediate rash, facial/tongue/throat swelling, SOB or lightheadedness with hypotension: Unknown Has patient had a PCN reaction causing severe rash involving mucus membranes or skin necrosis: Unknown Has patient had a PCN reaction that required hospitalization: Unknown Has patient had a PCN  reaction occurring within the last 10 years: No If all of the above answers are "NO", then may proceed with Cephalosporin use.    Social History:  reports that she quit smoking about 6 years ago. Her smoking use included cigarettes. She smoked 1.00 pack per day. She has never used smokeless tobacco. She reports that she does not drink alcohol and does not use drugs.  Family History  Problem Relation Age of Onset  . Hypertension Mother   . Colon cancer Neg Hx   . Esophageal cancer Neg Hx     The following portions of the patient's history were reviewed and updated as appropriate: allergies, current medications, past family history, past medical history, past social history, past surgical history and problem list.  Review of Systems Pertinent items noted in HPI and remainder of comprehensive ROS otherwise negative.  Physical Exam:  BP 125/86   Pulse 94   Wt 175 lb 9.6 oz (79.7 kg)   BMI 29.22 kg/m  CONSTITUTIONAL: Well-developed, well-nourished female in no acute distress.  HENT:  Normocephalic, atraumatic, External right and left ear normal. Oropharynx is clear and moist EYES: Conjunctivae and EOM are normal. Pupils are equal, round, and reactive to light. No scleral icterus.  NECK: Normal range of motion, supple, no masses.  Normal thyroid.  SKIN: Skin is warm and dry. No rash noted. Not diaphoretic. No erythema. No pallor. MUSCULOSKELETAL: Normal range of motion. No tenderness.  No cyanosis, clubbing, or edema.  2+ distal pulses. NEUROLOGIC: Alert and oriented to person, place, and time. Normal reflexes, muscle tone coordination.  PSYCHIATRIC: Normal mood and affect. Normal behavior. Normal judgment and thought content. CARDIOVASCULAR: Normal heart rate noted, regular rhythm RESPIRATORY: Clear to auscultation bilaterally. Effort and breath sounds normal, no problems with respiration noted. BREASTS: Symmetric in size. No masses, tenderness, skin changes, nipple drainage, or  lymphadenopathy bilaterally. Performed in the presence of a chaperone. ABDOMEN: Soft, no distention noted.  No tenderness, rebound or guarding.  PELVIC: Normal appearing external genitalia and urethral meatus  No abnormal discharge noted.+ contact bleeding. Absent uterus. No adnexal pain. Performed in the presence of a chaperone.   Assessment and Plan:    1. Women's annual routine gynecological examination - Hepatitis C Antibody - HIV antibody (with reflex) - Cervicovaginal ancillary only( [facility])  2. Painful urination - Urine Culture - POCT urine qual dipstick blood  3. Vaginal itching  4. Urgency of urination  Will wait for urine culture to decide treatment method.    Will follow up results  Mammogram completed  Routine preventative health maintenance measures emphasized. Please refer to After Visit Summary for other counseling recommendations.        Product/process development scientist for [employer], [place]

## 2020-05-26 NOTE — Progress Notes (Signed)
Mammogram 05/24/2020- normal  UTI? Vaginal odor x 3 weeks No STI testing

## 2020-05-27 DIAGNOSIS — N898 Other specified noninflammatory disorders of vagina: Secondary | ICD-10-CM

## 2020-05-27 DIAGNOSIS — R309 Painful micturition, unspecified: Secondary | ICD-10-CM | POA: Insufficient documentation

## 2020-05-27 DIAGNOSIS — R3915 Urgency of urination: Secondary | ICD-10-CM | POA: Insufficient documentation

## 2020-05-27 HISTORY — DX: Other specified noninflammatory disorders of vagina: N89.8

## 2020-05-27 LAB — CERVICOVAGINAL ANCILLARY ONLY
Bacterial Vaginitis (gardnerella): NEGATIVE
Candida Glabrata: NEGATIVE
Candida Vaginitis: NEGATIVE
Comment: NEGATIVE
Comment: NEGATIVE
Comment: NEGATIVE

## 2020-05-27 LAB — HIV ANTIBODY (ROUTINE TESTING W REFLEX): HIV Screen 4th Generation wRfx: NONREACTIVE

## 2020-05-27 LAB — HEPATITIS C ANTIBODY: Hep C Virus Ab: <0.1 s/co ratio (ref 0.0–0.9)

## 2020-05-28 LAB — URINE CULTURE

## 2020-06-03 ENCOUNTER — Ambulatory Visit
Admission: RE | Admit: 2020-06-03 | Discharge: 2020-06-03 | Disposition: A | Discharge status: Home / Self Care | Payer: BC Managed Care – PPO | Source: Ambulatory Visit | Attending: Primary Care | Admitting: Primary Care

## 2020-06-03 DIAGNOSIS — R9389 Abnormal findings on diagnostic imaging of other specified body structures: Secondary | ICD-10-CM

## 2020-06-03 MED ORDER — IOPAMIDOL (ISOVUE-300) INJECTION 61%
100.0000 mL | Freq: Once | INTRAVENOUS | Status: AC | PRN
  Administered 2020-06-03: 100 mL via INTRAVENOUS

## 2020-06-06 DIAGNOSIS — L723 Sebaceous cyst: Secondary | ICD-10-CM

## 2020-06-08 NOTE — Telephone Encounter (Signed)
Spoke with patient, the site she is referring to is a 3.1 cm sebaceous cyst.  Referral placed to dermatology for evaluation.

## 2020-06-21 ENCOUNTER — Other Ambulatory Visit: Payer: Self-pay

## 2020-06-21 DIAGNOSIS — J4520 Mild intermittent asthma, uncomplicated: Secondary | ICD-10-CM

## 2020-06-23 MED ORDER — ALBUTEROL SULFATE HFA 108 (90 BASE) MCG/ACT IN AERS: 1.0000 | INHALATION_SPRAY | Freq: Four times a day (QID) | RESPIRATORY_TRACT | 0 refills | Status: DC | PRN

## 2020-06-25 ENCOUNTER — Ambulatory Visit: Payer: BC Managed Care – PPO | Admitting: Podiatry

## 2020-07-30 ENCOUNTER — Ambulatory Visit (INDEPENDENT_AMBULATORY_CARE_PROVIDER_SITE_OTHER): Payer: BC Managed Care – PPO | Admitting: Podiatry

## 2020-07-30 ENCOUNTER — Other Ambulatory Visit: Payer: Self-pay

## 2020-07-30 DIAGNOSIS — L603 Nail dystrophy: Secondary | ICD-10-CM | POA: Diagnosis not present

## 2020-07-30 DIAGNOSIS — M722 Plantar fascial fibromatosis: Secondary | ICD-10-CM

## 2020-07-30 NOTE — Progress Notes (Signed)
  Subjective:  Patient ID: [name], female    DOB: 01/06/1971,  MRN: [id number]  Chief Complaint  Patient presents with  . Foot Problem    Bilateral plantar fibromas are painful.  . Nail Problem    Bilateral 1st toenails both borders are ingrown, unknown duration, denies drainage.   49 y.o. female presents with the above complaint. History confirmed with patient.   Objective:  Physical Exam: warm, good capillary refill, no trophic changes or ulcerative lesions, normal DP and PT pulses and normal sensory exam. Pain at both great toenails with ingrowing nails both borders. Partial nail lysis, transverse ridging. Plantar fibromas bilateral, large with POP  Assessment:   1. Plantar fibromatosis   2. Onychodystrophy    Plan:  Patient was evaluated and treated and all questions answered.  Onychodystrophy -Educated on etiology -Nails debrided of ingrown nail -Recommend urea / tolcylen gel   Plantar fibroma bilat -Very large, failed verapamil. Would benefit from excision should issues persist. Discussed surgery in brief will discuss more at next visit.  Return in about 6 weeks (around 09/10/2020) for Nail Fungus, Plantar fasciitis.

## 2020-08-10 ENCOUNTER — Other Ambulatory Visit: Payer: Self-pay | Admitting: Diagnostic Neuroimaging

## 2020-08-10 ENCOUNTER — Other Ambulatory Visit: Payer: Self-pay | Admitting: Primary Care

## 2020-08-10 DIAGNOSIS — G40B09 Juvenile myoclonic epilepsy, not intractable, without status epilepticus: Secondary | ICD-10-CM

## 2020-09-08 ENCOUNTER — Ambulatory Visit (INDEPENDENT_AMBULATORY_CARE_PROVIDER_SITE_OTHER)
Admission: RE | Admit: 2020-09-08 | Discharge: 2020-09-08 | Disposition: A | Discharge status: Home / Self Care | Payer: BC Managed Care – PPO | Source: Ambulatory Visit | Attending: Primary Care | Admitting: Primary Care

## 2020-09-08 ENCOUNTER — Ambulatory Visit: Payer: BC Managed Care – PPO | Admitting: Primary Care

## 2020-09-08 ENCOUNTER — Other Ambulatory Visit: Payer: Self-pay

## 2020-09-08 VITALS — BP 118/78 | HR 73 | Temp 97.0°F | Ht 65.0 in | Wt 177.5 lb

## 2020-09-08 DIAGNOSIS — M545 Low back pain, unspecified: Secondary | ICD-10-CM | POA: Diagnosis not present

## 2020-09-08 MED ORDER — PREDNISONE 20 MG PO TABS: ORAL_TABLET | ORAL | 0 refills | Status: DC

## 2020-09-08 MED ORDER — CYCLOBENZAPRINE HCL 5 MG PO TABS: 5.0000 mg | ORAL_TABLET | Freq: Two times a day (BID) | ORAL | 0 refills | Status: DC | PRN

## 2020-09-08 NOTE — Progress Notes (Signed)
Subjective:    Patient ID: [name], female    DOB: 03-26-1971, 50 y.o.   MRN: [id number]  HPI  This visit occurred during the SARS-CoV-2 public health emergency.  Safety protocols were in place, including screening questions prior to the visit, additional usage of staff PPE, and extensive cleaning of exam room while observing appropriate contact time as indicated for disinfecting solutions.   Ms. Troyer is a 50 year old female with a history of seizure disorder, asthma, GAD, chronic foot and hand pain, dupuytrens' contracture of the hands who presents today with a chief complaint of back pain.  Her pain is located to the mid lower back from the upper lumbar spine down to lower lumbar spine. When she lays down at night her pain will increase up to her neck.  Pain is constant with standing, sitting, bending forward, laying down. She does a lot of movement for work, she stocks inventory for work at [employer]. Symptoms began 3 weeks ago, unsure of the cause as she denies injury. She denies numbness/tingling and radiation of pain down her legs, loss of bowel or bladder control.  She's been taking Tylenol 3-4 times daily without improvement. She's not taken any Tylenol today.   Review of Systems  Musculoskeletal: Positive for back pain.  Skin: Negative for color change.  Neurological: Negative for weakness and numbness.       Past Medical History:  Diagnosis Date  . Asthma    worse when younger  . GERD (gastroesophageal reflux disease)   . Migraine    1-2x/month  . Persistent cough for 3 weeks or longer 10/09/2019  . PONV (postoperative nausea and vomiting)   . Seizure ([place])   . Vertigo    no episodes for 7 yrs     Social History   Socioeconomic History  . Marital status: Widowed    Spouse name: [name]  . Number of children: 0  . Years of education: 10th  . Highest education level: Not on file  Occupational History    Employer: [id number]  Tobacco Use  . Smoking  status: Former Smoker    Packs/day: 1.00    Types: Cigarettes    Quit date: 08/28/2013    Years since quitting: 7.0  . Smokeless tobacco: Never Used  . Tobacco comment: Quit Janurary 1st  Vaping Use  . Vaping Use: Never used  Substance and Sexual Activity  . Alcohol use: No    Alcohol/week: 0.0 standard drinks  . Drug use: No  . Sexual activity: Not on file  Other Topics Concern  . Not on file  Social History Narrative   Patient lives alone   No children.   Works at [employer].   Enjoys going to [employer], going to estate sells.   Right handed   Caffeine: 1 Dr. [name] every morning   Social Determinants of Health   Financial Resource Strain: Not on file  Food Insecurity: Not on file  Transportation Needs: Not on file  Physical Activity: Not on file  Stress: Not on file  Social Connections: Not on file  Intimate Partner Violence: Not on file    Past Surgical History:  Procedure Laterality Date  . ABDOMINAL HYSTERECTOMY  2012  . COLONOSCOPY WITH PROPOFOL N/A 04/24/2019   Procedure: COLONOSCOPY WITH PROPOFOL;  Surgeon: Virgel Manifold, MD;  Location: [facility];  Service: Endoscopy;  Laterality: N/A;  . ESOPHAGOGASTRODUODENOSCOPY (EGD) WITH PROPOFOL N/A 04/24/2019   Procedure: ESOPHAGOGASTRODUODENOSCOPY (EGD) WITH PROPOFOL;  Surgeon: Bonna Gains,  Lennette Bihari, MD;  Location: [facility];  Service: Endoscopy;  Laterality: N/A;  . HAND SURGERY Right 05/07/2020   trigger release    Family History  Problem Relation Age of Onset  . Hypertension Mother   . Colon cancer Neg Hx   . Esophageal cancer Neg Hx     Allergies  Allergen Reactions  . Asa Buff (Mag [Buffered Aspirin] Other (See Comments)    Stomach pain  . Azithromycin Other (See Comments)    GI problems  . Ibuprofen Other (See Comments)    GI upset  . Penicillins Other (See Comments)    Unknown childhood allergic reaction Has patient had a PCN reaction causing immediate rash, facial/tongue/throat  swelling, SOB or lightheadedness with hypotension: Unknown Has patient had a PCN reaction causing severe rash involving mucus membranes or skin necrosis: Unknown Has patient had a PCN reaction that required hospitalization: Unknown Has patient had a PCN reaction occurring within the last 10 years: No If all of the above answers are "NO", then may proceed with Cephalosporin use.    Current Outpatient Medications on File Prior to Visit  Medication Sig Dispense Refill  . acetaminophen (TYLENOL) 500 MG tablet Take 500 mg by mouth every 6 (six) hours as needed.    Marland Kitchen albuterol (VENTOLIN HFA) 108 (90 Base) MCG/ACT inhaler Inhale 1-2 puffs into the lungs every 6 (six) hours as needed for wheezing or shortness of breath. 18 g 0  . budesonide-formoterol (SYMBICORT) 160-4.5 MCG/ACT inhaler Inhale 1 puff into the lungs 2 (two) times daily. 1 Inhaler 0  . escitalopram (LEXAPRO) 20 MG tablet TAKE 1 TABLET BY MOUTH ONCE DAILY FOR ANXIETY 90 tablet 0  . linaclotide (LINZESS) 145 MCG CAPS capsule Take 1 capsule (145 mcg total) by mouth daily before breakfast. 30 capsule 3  . ondansetron (ZOFRAN) 4 MG tablet Take 1 tablet (4 mg total) by mouth every 8 (eight) hours as needed for nausea or vomiting. 20 tablet 0  . PHENobarbital (LUMINAL) 97.2 MG tablet Take 2 tablets by mouth once daily 180 tablet 1  . rizatriptan (MAXALT-MLT) 10 MG disintegrating tablet Take 1 tablet (10 mg total) by mouth as needed for migraine. May repeat in 2 hours if needed 9 tablet 11   No current facility-administered medications on file prior to visit.    BP 118/78   Pulse 73   Temp (!) 97 F (36.1 C) (Temporal)   Ht 5\' 5"  (1.651 m)   Wt 177 lb 8 oz (80.5 kg)   SpO2 97%   BMI 29.54 kg/m    Objective:   Physical Exam Constitutional:      Appearance: She is well-nourished.     Comments: Appears uncomfortable sitting, no distress  Pulmonary:     Effort: Pulmonary effort is normal.  Musculoskeletal:     Cervical back: Neck  supple.     Lumbar back: No tenderness or bony tenderness. Decreased range of motion. Negative right straight leg raise test and negative left straight leg raise test.       Back:     Comments: Decrease in ROM with all planes of movement due to pain. 5/5 strength to bilateral lower extremities   Skin:    General: Skin is warm and dry.  Psychiatric:        Mood and Affect: Mood and affect and mood normal.            Assessment & Plan:

## 2020-09-08 NOTE — Patient Instructions (Signed)
Start prednisone for back pain and inflammation. Take 3 tablets by mouth once daily for two days, then 2 tablets daily for two days, then 1 tablet for three days.  You may take cyclobenzaprine 5 mg twice daily for muscle spasms. This may cause drowsiness.   Complete xray(s) prior to leaving today. I will notify you of your results once received.  It was a pleasure to see you today!

## 2020-09-08 NOTE — Assessment & Plan Note (Signed)
Acute for three weeks, no alarm signs today. Exam suspicious for lumbar arthritis. No radiculopathy which is reassuring.  Checking xray today. Rx for prednisone course sent to pharmacy. Rx for Flexeril sent to pharmacy, drowsiness precautions provided.   Consider MRI if needed. She declines PT today.

## 2020-09-09 DIAGNOSIS — M545 Low back pain, unspecified: Secondary | ICD-10-CM

## 2020-09-10 ENCOUNTER — Other Ambulatory Visit: Payer: Self-pay | Admitting: Gastroenterology

## 2020-09-14 ENCOUNTER — Ambulatory Visit: Payer: BC Managed Care – PPO | Admitting: Podiatry

## 2020-09-14 ENCOUNTER — Other Ambulatory Visit: Payer: Self-pay

## 2020-09-14 DIAGNOSIS — M722 Plantar fascial fibromatosis: Secondary | ICD-10-CM | POA: Diagnosis not present

## 2020-09-14 DIAGNOSIS — D2121 Benign neoplasm of connective and other soft tissue of right lower limb, including hip: Secondary | ICD-10-CM | POA: Diagnosis not present

## 2020-09-14 DIAGNOSIS — L603 Nail dystrophy: Secondary | ICD-10-CM | POA: Diagnosis not present

## 2020-09-14 DIAGNOSIS — D2122 Benign neoplasm of connective and other soft tissue of left lower limb, including hip: Secondary | ICD-10-CM | POA: Diagnosis not present

## 2020-09-14 NOTE — Telephone Encounter (Signed)
Refill approved but I noted she still needs to reschedule her colonoscopy using a different bowel prep from the when she tried in 2020.  Thank you

## 2020-09-14 NOTE — Progress Notes (Signed)
  Subjective:  Patient ID: [name], female    DOB: 01/15/1971,  MRN: [id number]  Chief Complaint  Patient presents with  . Nail Problem    F/U Rt hallux nail fungus -pt states," it's growing but thick and ingrowed." - pt states she has not been compliance w/ Rx'd nail polish   50 y.o. female presents with the above complaint. History confirmed with patient.   Objective:  Physical Exam: warm, good capillary refill, no trophic changes or ulcerative lesions, normal DP and PT pulses and normal sensory exam. Pain at both great toenails with ingrowing nails both borders. Partial nail lysis, transverse ridging. Plantar fibromas bilateral, large with POP, worse on right than left.  Assessment:   1. Plantar fibromatosis   2. Onychodystrophy   3. Fibroma of left foot   4. Fibroma of foot, right    Plan:  Patient was evaluated and treated and all questions answered.  Onychodystrophy -Continue use of urea gel.  Patient has not been applying the gel as directed.  Plantar fibroma bilat -Patient would like to proceed with surgical excision.  We will start with the left first.  We did discuss surgery in detail including postoperative course.  She would be advised that she would be out of work for at least 1 month. -Patient has failed all conservative therapy and wishes to proceed with surgical intervention. All risks, benefits, and alternatives discussed with patient. No guarantees given. Consent reviewed and signed by patient. -Planned procedures: Excision plantar fibroma left -DME dispensed for post-op use: none   No follow-ups on file.

## 2020-09-15 NOTE — Telephone Encounter (Addendum)
Not able to get MRI approved  Patient made aware via voicemail and mychart message.   Patient needs to cancel her appt for the MRI 09/25/20

## 2020-09-25 ENCOUNTER — Other Ambulatory Visit: Payer: BC Managed Care – PPO

## 2020-09-27 ENCOUNTER — Other Ambulatory Visit: Payer: Self-pay | Admitting: Primary Care

## 2020-09-27 DIAGNOSIS — R110 Nausea: Secondary | ICD-10-CM

## 2020-09-28 ENCOUNTER — Telehealth: Payer: Self-pay

## 2020-09-28 NOTE — Telephone Encounter (Signed)
DOS 10/06/2020  PLANTAR FIBROMA LT - 28062  BCBS EFFECTIVE DATE - 08/28/2020  PLAN DEDUCTIBLE - $2750.00 W/ $2750.00 REMAINING OUT OF POCKET - $6850.00 W/ $6606.00 REMAINING COPAY $0.00 COINSURANCE - 25%  SPOKE TO JAMIE AT [facility] REF # [id number]. NO PRECERT REQUIRED FOR CPT [id number]

## 2020-09-28 NOTE — Telephone Encounter (Signed)
Pharmacy requests refill on: Ondansetron 4 mg   LAST REFILL: 01/16/2020 (Q-20, R-0) LAST OV: 09/08/2020 NEXT OV: Not Scheduled  PHARMACY: [facility] [place], [place]

## 2020-09-28 NOTE — Telephone Encounter (Signed)
Zofran is not a routine refillable prescription. What is going on to where she needs a refill?

## 2020-09-29 NOTE — Telephone Encounter (Signed)
Called patient was requested by mistake. Did not need will refuse.

## 2020-10-05 DIAGNOSIS — M79676 Pain in unspecified toe(s): Secondary | ICD-10-CM

## 2020-10-06 ENCOUNTER — Other Ambulatory Visit: Payer: Self-pay | Admitting: Podiatry

## 2020-10-06 DIAGNOSIS — R110 Nausea: Secondary | ICD-10-CM

## 2020-10-06 DIAGNOSIS — M722 Plantar fascial fibromatosis: Secondary | ICD-10-CM

## 2020-10-06 MED ORDER — HYDROCODONE-ACETAMINOPHEN 5-325 MG PO TABS: 1.0000 | ORAL_TABLET | Freq: Four times a day (QID) | ORAL | 0 refills | Status: DC | PRN

## 2020-10-06 MED ORDER — ONDANSETRON HCL 4 MG PO TABS: 4.0000 mg | ORAL_TABLET | Freq: Three times a day (TID) | ORAL | 0 refills | Status: DC | PRN

## 2020-10-06 MED ORDER — CLINDAMYCIN HCL 150 MG PO CAPS: 150.0000 mg | ORAL_CAPSULE | Freq: Two times a day (BID) | ORAL | 0 refills | Status: DC

## 2020-10-06 NOTE — Progress Notes (Signed)
Rx sent to pharmacy for outpatient surgery. °

## 2020-10-12 ENCOUNTER — Ambulatory Visit (INDEPENDENT_AMBULATORY_CARE_PROVIDER_SITE_OTHER): Payer: BC Managed Care – PPO | Admitting: Podiatry

## 2020-10-12 ENCOUNTER — Other Ambulatory Visit: Payer: Self-pay

## 2020-10-12 DIAGNOSIS — D2122 Benign neoplasm of connective and other soft tissue of left lower limb, including hip: Secondary | ICD-10-CM

## 2020-10-12 DIAGNOSIS — Z9889 Other specified postprocedural states: Secondary | ICD-10-CM

## 2020-10-15 ENCOUNTER — Encounter: Payer: Self-pay | Admitting: Podiatry

## 2020-10-25 ENCOUNTER — Other Ambulatory Visit: Payer: Self-pay | Admitting: *Deleted

## 2020-10-25 DIAGNOSIS — G43009 Migraine without aura, not intractable, without status migrainosus: Secondary | ICD-10-CM

## 2020-10-25 MED ORDER — RIZATRIPTAN BENZOATE 10 MG PO TBDP: 10.0000 mg | ORAL_TABLET | ORAL | 5 refills | Status: DC | PRN

## 2020-10-25 NOTE — Progress Notes (Signed)
  Subjective:  Patient ID: [name], female    DOB: 07/31/1971,  MRN: [id number]  Chief Complaint  Patient presents with  . Routine Post Op    POV#1 -pt denies N/V/F/Ch - dressing clean and intact -pt states,  It's fine just a little sore; 1/10, 2nd day with numbness and tinlging and 3rd day with a fire sensation but after that it's been fine." Tx: crutches, icing, elevation and boot     DOS: 10/06/20 Procedure: Excision of plantar fibroma left   50 y.o. female presents with the above complaint. History confirmed with patient.   Objective:  Physical Exam: tenderness at the surgical site, local edema noted and calf supple, nontender. Incision: healing well, no significant drainage, no dehiscence, no significant erythema  Assessment:   1. Fibroma of left foot   2. Post-operative state     Plan:  Patient was evaluated and treated and all questions answered.  Post-operative State -Healing well as expected postoperatively -Dressing applied consisting of sterile gauze, kerlix and ACE bandage -NWB with crutches  No follow-ups on file.

## 2020-10-27 ENCOUNTER — Other Ambulatory Visit: Payer: Self-pay

## 2020-10-27 ENCOUNTER — Encounter: Payer: Self-pay | Admitting: Primary Care

## 2020-10-27 ENCOUNTER — Ambulatory Visit: Payer: BC Managed Care – PPO | Admitting: Primary Care

## 2020-10-27 VITALS — BP 110/72 | HR 78 | Temp 97.6°F | Ht 65.0 in | Wt 177.0 lb

## 2020-10-27 DIAGNOSIS — J4520 Mild intermittent asthma, uncomplicated: Secondary | ICD-10-CM

## 2020-10-27 DIAGNOSIS — R232 Flushing: Secondary | ICD-10-CM | POA: Insufficient documentation

## 2020-10-27 DIAGNOSIS — R053 Chronic cough: Secondary | ICD-10-CM | POA: Diagnosis not present

## 2020-10-27 DIAGNOSIS — G43009 Migraine without aura, not intractable, without status migrainosus: Secondary | ICD-10-CM | POA: Diagnosis not present

## 2020-10-27 DIAGNOSIS — K5900 Constipation, unspecified: Secondary | ICD-10-CM

## 2020-10-27 DIAGNOSIS — F411 Generalized anxiety disorder: Secondary | ICD-10-CM

## 2020-10-27 DIAGNOSIS — K2100 Gastro-esophageal reflux disease with esophagitis, without bleeding: Secondary | ICD-10-CM

## 2020-10-27 DIAGNOSIS — E785 Hyperlipidemia, unspecified: Secondary | ICD-10-CM

## 2020-10-27 DIAGNOSIS — G40B09 Juvenile myoclonic epilepsy, not intractable, without status epilepticus: Secondary | ICD-10-CM

## 2020-10-27 LAB — COMPREHENSIVE METABOLIC PANEL
ALT: 16 U/L (ref 0–35)
AST: 15 U/L (ref 0–37)
Albumin: 4.1 g/dL (ref 3.5–5.2)
Alkaline Phosphatase: 89 U/L (ref 39–117)
BUN: 9 mg/dL (ref 6–23)
CO2: 30 mEq/L (ref 19–32)
Calcium: 9.2 mg/dL (ref 8.4–10.5)
Chloride: 103 mEq/L (ref 96–112)
Creatinine, Ser: 0.76 mg/dL (ref 0.40–1.20)
GFR: 90.78 mL/min (ref >60.00)
Glucose, Bld: 92 mg/dL (ref 70–99)
Potassium: 3.7 mEq/L (ref 3.5–5.1)
Sodium: 138 mEq/L (ref 135–145)
Total Bilirubin: 0.3 mg/dL (ref 0.2–1.2)
Total Protein: 7.2 g/dL (ref 6.0–8.3)

## 2020-10-27 LAB — LIPID PANEL
Cholesterol: 233 mg/dL — ABNORMAL HIGH (ref 0–200)
HDL: 48.2 mg/dL (ref >39.00)
NonHDL: 184.73
Total CHOL/HDL Ratio: 5
Triglycerides: 229 mg/dL — ABNORMAL HIGH (ref 0.0–149.0)
VLDL: 45.8 mg/dL — ABNORMAL HIGH (ref 0.0–40.0)

## 2020-10-27 LAB — CBC
HCT: 42.8 % (ref 36.0–46.0)
Hemoglobin: 14.8 g/dL (ref 12.0–15.0)
MCHC: 34.5 g/dL (ref 30.0–36.0)
MCV: 92.1 fl (ref 78.0–100.0)
Platelets: 217 10*3/uL (ref 150.0–400.0)
RBC: 4.64 Mil/uL (ref 3.87–5.11)
RDW: 12.7 % (ref 11.5–15.5)
WBC: 6.2 10*3/uL (ref 4.0–10.5)

## 2020-10-27 LAB — LDL CHOLESTEROL, DIRECT: Direct LDL: 137 mg/dL

## 2020-10-27 MED ORDER — BUDESONIDE-FORMOTEROL FUMARATE 160-4.5 MCG/ACT IN AERO
1.0000 | INHALATION_SPRAY | Freq: Two times a day (BID) | RESPIRATORY_TRACT | 0 refills | Status: DC
Start: 2020-10-27 — End: 2021-02-18

## 2020-10-27 MED ORDER — ESCITALOPRAM OXALATE 20 MG PO TABS: ORAL_TABLET | ORAL | 3 refills | Status: DC

## 2020-10-27 NOTE — Assessment & Plan Note (Signed)
No recent seizure activity. Feels well managed on phenobarbital 97.2 mg (2 capsules daily).  Follows with neurology.

## 2020-10-27 NOTE — Assessment & Plan Note (Signed)
Infrequent migraines, using Maxalt 10 mg sparingly. Continue same.

## 2020-10-27 NOTE — Assessment & Plan Note (Signed)
Chronic, continued. She will follow up with GI for endoscopy and colonoscopy later this year

## 2020-10-27 NOTE — Assessment & Plan Note (Signed)
Feels well managed on Lexapro 20 mg, continue same.  Refill sent to pharmacy.

## 2020-10-27 NOTE — Assessment & Plan Note (Signed)
Improved with Linzess 145 micrograms, no recent GI follow-up but she plans to follow-up later this year for colonoscopy.

## 2020-10-27 NOTE — Assessment & Plan Note (Signed)
Well-controlled without controller and infrequent use of rescue inhaler.  Refill provided for Symbicort to use during seasonal changes as her allergies and asthma flareup during the spring.  Discussed to start Symbicort if she requires use of her albuterol inhaler more than 3 times weekly consistently.

## 2020-10-27 NOTE — Patient Instructions (Signed)
Use the budesonide-formoterol (Symbicort) inhaler twice daily for asthma during seasonal changes.  Use the albuterol (rescue inhaler) as needed for shortness of breath/wheezing.  If you find yourself needing this inhaler more than 3 times weekly for more than a few weeks, then start the Symbicort above.  Follow-up with your gynecologist regarding the hot flashes if no improvement.  We can try switching you to venlafaxine if needed.  Stop by the lab prior to leaving today. I will notify you of your results once received.   It was a pleasure to see you today!

## 2020-10-27 NOTE — Progress Notes (Signed)
Subjective:    Patient ID: [name], female    DOB: 02/25/1971, 50 y.o.   MRN: [id number]  HPI  This visit occurred during the SARS-CoV-2 public health emergency.  Safety protocols were in place, including screening questions prior to the visit, additional usage of staff PPE, and extensive cleaning of exam room while observing appropriate contact time as indicated for disinfecting solutions.   [name] is a 50 year old female with a history of asthma, migraines, GERD, seizure disorder, anxiety disorder who presents today for general follow-up, medication refill and a chief complaint of hot flashes.  1) Chronic Constipation: Currently managed on Linzess 145 mcg daily which was helpful, but has not taken anything recently. Following with GI, is to undergo colonoscopy and endoscopy at some point soon. Overall feels GERD is uncontrolled.  2) GAD: Currently managed on Lexapro 20 mg daily. Endorses feeling well managed on this medication.   3) Asthma: Currently managed on Symbicort 160-4.5 twice daily and albuterol inhaler as needed. She is using the albuterol inhaler sparingly. She's not used her Symbicort in months.   4) Migraines: Currently managed on rizatriptan 10 mg as needed. Feels well managed on this medication.   5) Hot Flashes: Chronic since the age of 11, slowly progressing since then. Now she's experiencing full body sweats, soaking clothes, needing a fan, night sweats waking her from sleep. History of hysterectomy years ago, she thinks that she may have her left ovary. She follows with GYN, no visit recently.   BP Readings from Last 3 Encounters:  10/27/20 110/72  09/08/20 118/78  05/26/20 125/86     Review of Systems  Respiratory: Negative for shortness of breath and wheezing.   Cardiovascular: Negative for chest pain.  Gastrointestinal: Positive for constipation.  Genitourinary:       Hot flashes  Musculoskeletal: Positive for arthralgias.  Neurological:  Negative for seizures and headaches.        Past Medical History:  Diagnosis Date  . Asthma    worse when younger  . GERD (gastroesophageal reflux disease)   . Migraine    1-2x/month  . Persistent cough for 3 weeks or longer 10/09/2019  . PONV (postoperative nausea and vomiting)   . Seizure ([place])   . Vertigo    no episodes for 7 yrs     Social History   Socioeconomic History  . Marital status: Widowed    Spouse name: [name]  . Number of children: 0  . Years of education: 10th  . Highest education level: Not on file  Occupational History    Employer: [employer]  Tobacco Use  . Smoking status: Former Smoker    Packs/day: 1.00    Types: Cigarettes    Quit date: 08/28/2013    Years since quitting: 7.1  . Smokeless tobacco: Never Used  . Tobacco comment: Quit Janurary 1st  Vaping Use  . Vaping Use: Never used  Substance and Sexual Activity  . Alcohol use: No    Alcohol/week: 0.0 standard drinks  . Drug use: No  . Sexual activity: Not on file  Other Topics Concern  . Not on file  Social History Narrative   Patient lives alone   No children.   Works at [employer].   Enjoys going to [employer], going to estate sells.   Right handed   Caffeine: 1 Dr. [name] every morning   Social Determinants of Health   Financial Resource Strain: Not on file  Food Insecurity: Not on file  Transportation  Needs: Not on file  Physical Activity: Not on file  Stress: Not on file  Social Connections: Not on file  Intimate Partner Violence: Not on file    Past Surgical History:  Procedure Laterality Date  . ABDOMINAL HYSTERECTOMY  2012  . COLONOSCOPY WITH PROPOFOL N/A 04/24/2019   Procedure: COLONOSCOPY WITH PROPOFOL;  Surgeon: Virgel Manifold, MD;  Location: [facility] ENDOSCOPY;  Service: Endoscopy;  Laterality: N/A;  . ESOPHAGOGASTRODUODENOSCOPY (EGD) WITH PROPOFOL N/A 04/24/2019   Procedure: ESOPHAGOGASTRODUODENOSCOPY (EGD) WITH PROPOFOL;  Surgeon: Virgel Manifold, MD;  Location:  [facility] ENDOSCOPY;  Service: Endoscopy;  Laterality: N/A;  . HAND SURGERY Right 05/07/2020   trigger release    Family History  Problem Relation Age of Onset  . Hypertension Mother   . Colon cancer Neg Hx   . Esophageal cancer Neg Hx     Allergies  Allergen Reactions  . Asa Buff (Mag [Buffered Aspirin] Other (See Comments)    Stomach pain  . Azithromycin Other (See Comments)    GI problems  . Ibuprofen Other (See Comments)    GI upset  . Penicillins Other (See Comments)    Unknown childhood allergic reaction Has patient had a PCN reaction causing immediate rash, facial/tongue/throat swelling, SOB or lightheadedness with hypotension: Unknown Has patient had a PCN reaction causing severe rash involving mucus membranes or skin necrosis: Unknown Has patient had a PCN reaction that required hospitalization: Unknown Has patient had a PCN reaction occurring within the last 10 years: No If all of the above answers are "NO", then may proceed with Cephalosporin use.    Current Outpatient Medications on File Prior to Visit  Medication Sig Dispense Refill  . acetaminophen (TYLENOL) 500 MG tablet Take 500 mg by mouth every 6 (six) hours as needed.    Marland Kitchen albuterol (VENTOLIN HFA) 108 (90 Base) MCG/ACT inhaler Inhale 1-2 puffs into the lungs every 6 (six) hours as needed for wheezing or shortness of breath. 18 g 0  . ondansetron (ZOFRAN) 4 MG tablet Take 1 tablet (4 mg total) by mouth every 8 (eight) hours as needed for nausea or vomiting. 20 tablet 0  . PHENobarbital (LUMINAL) 97.2 MG tablet Take 2 tablets by mouth once daily 180 tablet 1  . predniSONE (DELTASONE) 20 MG tablet Take 3 tablets by mouth once daily for two days, then 2 tablets daily for two days, then 1 tablet for three days. 13 tablet 0  . rizatriptan (MAXALT-MLT) 10 MG disintegrating tablet Take 1 tablet (10 mg total) by mouth as needed for migraine. May repeat in 2 hours if needed 9 tablet 5   No current facility-administered  medications on file prior to visit.    BP 110/72   Pulse 78   Temp 97.6 F (36.4 C) (Temporal)   Ht 5\' 5"  (1.651 m)   Wt 177 lb (80.3 kg)   SpO2 97%   BMI 29.45 kg/m    Objective:   Physical Exam Constitutional:      Appearance: She is well-nourished.  Cardiovascular:     Rate and Rhythm: Normal rate and regular rhythm.  Pulmonary:     Effort: Pulmonary effort is normal.     Breath sounds: Normal breath sounds. No wheezing.  Musculoskeletal:     Cervical back: Neck supple.  Skin:    General: Skin is warm and dry.  Psychiatric:        Mood and Affect: Mood and affect and mood normal.  Assessment & Plan:

## 2020-10-27 NOTE — Assessment & Plan Note (Signed)
Chronic since the age of 42, progressed over the last several months.  Discussed options for treatment which included GYN evaluation, over-the-counter options, switching from Lexapro to Effexor.  She will start with OTC products, then will notify if she decides to see GYN versus trying venlafaxine.  Discussed conservative efforts to prevent hot flashes.

## 2020-10-29 ENCOUNTER — Other Ambulatory Visit: Payer: Self-pay

## 2020-10-29 ENCOUNTER — Ambulatory Visit (INDEPENDENT_AMBULATORY_CARE_PROVIDER_SITE_OTHER): Payer: BC Managed Care – PPO | Admitting: Podiatry

## 2020-10-29 DIAGNOSIS — D2122 Benign neoplasm of connective and other soft tissue of left lower limb, including hip: Secondary | ICD-10-CM

## 2020-10-29 DIAGNOSIS — Z9889 Other specified postprocedural states: Secondary | ICD-10-CM

## 2020-11-01 NOTE — Progress Notes (Signed)
  Subjective:  Patient ID: Sharon Seller, female    DOB: 11/20/1970,  MRN: [id number]  No chief complaint on file.   DOS: 10/06/20 Procedure: Excision of plantar fibroma left   50 y.o. female presents with the above complaint. History confirmed with patient.  States that is doing fine had a little bit of burning last night has been icing and elevating as directed denies drainage  Objective:  Physical Exam: tenderness at the surgical site, local edema noted and calf supple, nontender. Incision: healing well, no significant drainage, no dehiscence, no significant erythema  Assessment:   1. Fibroma of left foot   2. Post-operative state     Plan:  Patient was evaluated and treated and all questions answered.  Post-operative State -Healing well as expected postoperatively -Sutures removed staples left intact -Dressing applied consisting of sterile gauze, kerlix and ACE bandage -NWB with crutches  -Plan for staple removal next visit  No follow-ups on file.

## 2020-11-05 ENCOUNTER — Ambulatory Visit (INDEPENDENT_AMBULATORY_CARE_PROVIDER_SITE_OTHER): Payer: BC Managed Care – PPO | Admitting: Podiatry

## 2020-11-05 ENCOUNTER — Other Ambulatory Visit: Payer: Self-pay

## 2020-11-05 DIAGNOSIS — D2122 Benign neoplasm of connective and other soft tissue of left lower limb, including hip: Secondary | ICD-10-CM | POA: Diagnosis not present

## 2020-11-05 DIAGNOSIS — Z9889 Other specified postprocedural states: Secondary | ICD-10-CM

## 2020-11-05 NOTE — Progress Notes (Signed)
  Subjective:  Patient ID: [name], female    DOB: 1971-03-25,  MRN: [id number]  No chief complaint on file.   DOS: 10/06/20 Procedure: Excision of plantar fibroma left   50 y.o. female presents with the above complaint. History confirmed with patient.  States that she is doing well denies new issues. Not having pain.  Objective:  Physical Exam: tenderness at the surgical site, local edema noted and calf supple, nontender. Incision: healing well, no significant drainage, no dehiscence, no significant erythema  Assessment:   1. Fibroma of left foot   2. Post-operative state    Plan:  Patient was evaluated and treated and all questions answered.  Post-operative State -Staples removed today -Ok to shower, not soak x2 weeks -Abx ointment and band-aid to incision daily -Transition to sx shoe. Shoe dispensed  No follow-ups on file.

## 2020-11-10 ENCOUNTER — Telehealth: Payer: Self-pay | Admitting: Podiatry

## 2020-11-10 NOTE — Telephone Encounter (Signed)
I"m fine with that if she is - it really depends on how she's feeling. I can extend it another week or two if needed

## 2020-11-10 NOTE — Telephone Encounter (Signed)
Patient is scheduled to RTW on 11/15/20, will she be released on that day? If not, when and is she returning to work with any restrictions? I called patient and she was not sure what you said.

## 2020-11-19 ENCOUNTER — Other Ambulatory Visit: Payer: Self-pay

## 2020-11-19 ENCOUNTER — Ambulatory Visit (INDEPENDENT_AMBULATORY_CARE_PROVIDER_SITE_OTHER): Payer: BC Managed Care – PPO | Admitting: Podiatry

## 2020-11-19 DIAGNOSIS — D2121 Benign neoplasm of connective and other soft tissue of right lower limb, including hip: Secondary | ICD-10-CM | POA: Diagnosis not present

## 2020-11-19 DIAGNOSIS — M722 Plantar fascial fibromatosis: Secondary | ICD-10-CM

## 2020-11-19 DIAGNOSIS — D2122 Benign neoplasm of connective and other soft tissue of left lower limb, including hip: Secondary | ICD-10-CM

## 2020-11-19 DIAGNOSIS — Z9889 Other specified postprocedural states: Secondary | ICD-10-CM

## 2020-11-19 NOTE — Progress Notes (Signed)
  Subjective:  Patient ID: [name], female    DOB: Sep 04, 1970,  MRN: [id number]  Chief Complaint  Patient presents with  . Routine Post Op    Pt states improvement, some swelling throughout the day which worsens with activity.   DOS: 10/06/20 Procedure: Excision of plantar fibroma left   50 y.o. female presents with the above complaint. History confirmed with patient.   Objective:  Physical Exam: tenderness at the surgical site, local edema noted and calf supple, nontender. Incision: well healed.  Assessment:   1. Fibroma of foot, right   2. Fibroma of left foot   3. Plantar fibromatosis   4. Post-operative state    Plan:  Patient was evaluated and treated and all questions answered.  Post-operative State -Continue normal shoegear left  Right foot fibroma -Patient wishes to proceed with right foot fibroma in several months. Consent forms reviewed and signed.  -Patient has failed all conservative therapy and wishes to proceed with surgical intervention. All risks, benefits, and alternatives discussed with patient. No guarantees given. Consent reviewed and signed by patient. -Planned procedures: Right foot excision of fibromas x2   Return in about 6 weeks (around 12/31/2020) for Fibroma f/u bilat.

## 2020-12-31 ENCOUNTER — Other Ambulatory Visit: Payer: Self-pay

## 2020-12-31 ENCOUNTER — Ambulatory Visit (INDEPENDENT_AMBULATORY_CARE_PROVIDER_SITE_OTHER): Payer: BC Managed Care – PPO | Admitting: Podiatry

## 2020-12-31 DIAGNOSIS — R61 Generalized hyperhidrosis: Secondary | ICD-10-CM

## 2020-12-31 DIAGNOSIS — D2122 Benign neoplasm of connective and other soft tissue of left lower limb, including hip: Secondary | ICD-10-CM

## 2020-12-31 DIAGNOSIS — M722 Plantar fascial fibromatosis: Secondary | ICD-10-CM

## 2020-12-31 MED ORDER — DRYSOL 20 % EX SOLN: Freq: Every day | CUTANEOUS | 0 refills | Status: DC

## 2020-12-31 NOTE — Progress Notes (Signed)
  Subjective:  Patient ID: [name], female    DOB: 12/13/1970,  MRN: [id number]  Chief Complaint  Patient presents with  . Routine Post Op    POV#5 Denies fever/chills/nausea/vomiting. Pt states some residual swelling and occasional sharp pains.   DOS: 10/06/20 Procedure: Excision of plantar fibroma left   50 y.o. female presents with the above complaint. History confirmed with patient.   Objective:  Physical Exam: no tenderness at the surgical site, local edema noted and calf supple, nontender. Incision: well healed.  Assessment:   1. Hyperhidrosis   2. Fibroma of left foot   3. Plantar fibromatosis    Plan:  Patient was evaluated and treated and all questions answered.  Post-operative State -Continue normal shoegear left. Doing well other than swelling. This should resolve with time.  Right foot fibroma -Pending surgery  Hyperhidrosis -Rx drysol.  Return if symptoms worsen or fail to improve.

## 2021-02-02 ENCOUNTER — Other Ambulatory Visit: Payer: Self-pay | Admitting: Diagnostic Neuroimaging

## 2021-02-02 DIAGNOSIS — G40B09 Juvenile myoclonic epilepsy, not intractable, without status epilepticus: Secondary | ICD-10-CM

## 2021-02-08 ENCOUNTER — Ambulatory Visit: Payer: BC Managed Care – PPO | Admitting: Family Medicine

## 2021-02-18 ENCOUNTER — Telehealth: Payer: Self-pay

## 2021-02-18 ENCOUNTER — Other Ambulatory Visit: Payer: Self-pay

## 2021-02-18 ENCOUNTER — Encounter: Payer: Self-pay | Admitting: Primary Care

## 2021-02-18 ENCOUNTER — Ambulatory Visit: Payer: BC Managed Care – PPO | Admitting: Primary Care

## 2021-02-18 VITALS — BP 120/68 | HR 82 | Temp 97.6°F | Ht 65.0 in | Wt 176.0 lb

## 2021-02-18 DIAGNOSIS — R5383 Other fatigue: Secondary | ICD-10-CM

## 2021-02-18 DIAGNOSIS — F411 Generalized anxiety disorder: Secondary | ICD-10-CM | POA: Diagnosis not present

## 2021-02-18 DIAGNOSIS — J4530 Mild persistent asthma, uncomplicated: Secondary | ICD-10-CM | POA: Diagnosis not present

## 2021-02-18 LAB — VITAMIN D 25 HYDROXY (VIT D DEFICIENCY, FRACTURES): VITD: 14.12 ng/mL — ABNORMAL LOW (ref 30.00–100.00)

## 2021-02-18 LAB — CBC
HCT: 40.9 % (ref 36.0–46.0)
Hemoglobin: 14.1 g/dL (ref 12.0–15.0)
MCHC: 34.6 g/dL (ref 30.0–36.0)
MCV: 93.7 fl (ref 78.0–100.0)
Platelets: 210 10*3/uL (ref 150.0–400.0)
RBC: 4.37 Mil/uL (ref 3.87–5.11)
RDW: 13.3 % (ref 11.5–15.5)
WBC: 7.8 10*3/uL (ref 4.0–10.5)

## 2021-02-18 LAB — TSH: TSH: 1.44 u[IU]/mL (ref 0.35–4.50)

## 2021-02-18 LAB — VITAMIN B12: Vitamin B-12: 137 pg/mL — ABNORMAL LOW (ref 211–911)

## 2021-02-18 MED ORDER — FLUTICASONE FUROATE-VILANTEROL 200-25 MCG/INH IN AEPB: 1.0000 | INHALATION_SPRAY | Freq: Every day | RESPIRATORY_TRACT | 0 refills | Status: DC

## 2021-02-18 NOTE — Assessment & Plan Note (Signed)
Stable, no new concerns. Suspect sleep apnea is causing fatigue.  Continue Lexapro 20 mg.

## 2021-02-18 NOTE — Patient Instructions (Signed)
Stop by the lab prior to leaving today. I will notify you of your results once received.   Please mention to your neurologist that you need a sleep study. We will call too.  It was a pleasure to see you today!

## 2021-02-18 NOTE — Progress Notes (Signed)
Subjective:    Patient ID: [name], female    DOB: 06/07/1971, 50 y.o.   MRN: [id number]  HPI  [name] is a very pleasant 49 y.o. female with a history of migraines, asthma, GERD, seizures, GAD,who presents today to discuss fatigue.  She wakes around 2 am to be at work around 4 am. She goes to bed around 6-7 pm. When waking in the morning she feels so tired, has missed work due to sleeping through alarms. Denies waking during the night. Around 3-4 pm she feels very tired, tries to fight against taking a nap. During the weekends she will sleep 16 hours.   She's been working this schedule for 2 years, but symptoms began about 2-3 weeks ago. She denies changes in meds, diet, anything. She was once told that she snores, typically has a history of difficulty sleeping.   She has noticed some depression but feels that this is stable, doesn't have many friends, and mostly works and goes home. She has never undergone sleep study, but this was recommended previously. She does follow with neurology.  She does make mention that her Symbicort inhaler is too expensive. She is requesting an alternative.   BP Readings from Last 3 Encounters:  02/18/21 120/68  10/27/20 110/72  09/08/20 118/78      Review of Systems  Constitutional:  Positive for fatigue.  Respiratory:  Negative for shortness of breath.   Cardiovascular:  Negative for chest pain.  Neurological:        Sleeping too much  Psychiatric/Behavioral:  Negative for sleep disturbance.         Past Medical History:  Diagnosis Date   Asthma    worse when younger   GERD (gastroesophageal reflux disease)    Migraine    1-2x/month   Persistent cough for 3 weeks or longer 10/09/2019   PONV (postoperative nausea and vomiting)    Seizure ([facility])    Vertigo    no episodes for 7 yrs    Social History   Socioeconomic History   Marital status: Widowed    Spouse name: [name]   Number of children: 0   Years of  education: 10th   Highest education level: Not on file  Occupational History    Employer: [id number]  Tobacco Use   Smoking status: Former    Packs/day: 1.00    Pack years: 0.00    Types: Cigarettes    Quit date: 08/28/2013    Years since quitting: 7.4   Smokeless tobacco: Never   Tobacco comments:    Quit Janurary 1st  Vaping Use   Vaping Use: Never used  Substance and Sexual Activity   Alcohol use: No    Alcohol/week: 0.0 standard drinks   Drug use: No   Sexual activity: Not on file  Other Topics Concern   Not on file  Social History Narrative   Patient lives alone   No children.   Works at [employer].   Enjoys going to [employer], going to estate sells.   Right handed   Caffeine: 1 Dr. [name] every morning   Social Determinants of Health   Financial Resource Strain: Not on file  Food Insecurity: Not on file  Transportation Needs: Not on file  Physical Activity: Not on file  Stress: Not on file  Social Connections: Not on file  Intimate Partner Violence: Not on file    Past Surgical History:  Procedure Laterality Date   ABDOMINAL HYSTERECTOMY  2012  COLONOSCOPY WITH PROPOFOL N/A 04/24/2019   Procedure: COLONOSCOPY WITH PROPOFOL;  Surgeon: Virgel Manifold, MD;  Location: [facility] ENDOSCOPY;  Service: Endoscopy;  Laterality: N/A;   ESOPHAGOGASTRODUODENOSCOPY (EGD) WITH PROPOFOL N/A 04/24/2019   Procedure: ESOPHAGOGASTRODUODENOSCOPY (EGD) WITH PROPOFOL;  Surgeon: Virgel Manifold, MD;  Location: [facility] ENDOSCOPY;  Service: Endoscopy;  Laterality: N/A;   HAND SURGERY Right 05/07/2020   trigger release    Family History  Problem Relation Age of Onset   Hypertension Mother    Colon cancer Neg Hx    Esophageal cancer Neg Hx     Allergies  Allergen Reactions   Asa Buff (Mag [Buffered Aspirin] Other (See Comments)    Stomach pain   Azithromycin Other (See Comments)    GI problems   Ibuprofen Other (See Comments)    GI upset   Penicillins Other (See Comments)     Unknown childhood allergic reaction Has patient had a PCN reaction causing immediate rash, facial/tongue/throat swelling, SOB or lightheadedness with hypotension: Unknown Has patient had a PCN reaction causing severe rash involving mucus membranes or skin necrosis: Unknown Has patient had a PCN reaction that required hospitalization: Unknown Has patient had a PCN reaction occurring within the last 10 years: No If all of the above answers are "NO", then may proceed with Cephalosporin use.    Current Outpatient Medications on File Prior to Visit  Medication Sig Dispense Refill   albuterol (VENTOLIN HFA) 108 (90 Base) MCG/ACT inhaler Inhale 1-2 puffs into the lungs every 6 (six) hours as needed for wheezing or shortness of breath. 18 g 0   escitalopram (LEXAPRO) 20 MG tablet TAKE 1 TABLET BY MOUTH ONCE DAILY FOR ANXIETY 90 tablet 3   ondansetron (ZOFRAN) 4 MG tablet Take 1 tablet (4 mg total) by mouth every 8 (eight) hours as needed for nausea or vomiting. 20 tablet 0   PHENobarbital (LUMINAL) 97.2 MG tablet Take 2 tablets by mouth once daily 180 tablet 1   rizatriptan (MAXALT-MLT) 10 MG disintegrating tablet Take 1 tablet (10 mg total) by mouth as needed for migraine. May repeat in 2 hours if needed 9 tablet 5   budesonide-formoterol (SYMBICORT) 160-4.5 MCG/ACT inhaler Inhale 1 puff into the lungs 2 (two) times daily. (Patient not taking: Reported on 02/18/2021) 1 each 0   No current facility-administered medications on file prior to visit.    BP 120/68   Pulse 82   Temp 97.6 F (36.4 C) (Temporal)   Ht 5\' 5"  (1.651 m)   Wt 176 lb (79.8 kg)   SpO2 96%   BMI 29.29 kg/m  Objective:   Physical Exam Cardiovascular:     Rate and Rhythm: Normal rate and regular rhythm.  Pulmonary:     Effort: Pulmonary effort is normal.     Breath sounds: Normal breath sounds.  Musculoskeletal:     Cervical back: Neck supple.  Skin:    General: Skin is warm and dry.          Assessment & Plan:       This visit occurred during the SARS-CoV-2 public health emergency.  Safety protocols were in place, including screening questions prior to the visit, additional usage of staff PPE, and extensive cleaning of exam room while observing appropriate contact time as indicated for disinfecting solutions.

## 2021-02-18 NOTE — Assessment & Plan Note (Signed)
Symbicort too costly. Will switch to [employer].  She will notify if this is too costly.

## 2021-02-18 NOTE — Telephone Encounter (Signed)
Called to let office know that Anda Kraft recommends a sleep study and should discuss at [place] appointment with office. They are not open on fridays. Will call back Monday.

## 2021-02-18 NOTE — Assessment & Plan Note (Signed)
Strongly suggestive of sleep apnea.  She already follows with neurology, has an appointment scheduled for 03/14/21. We will call and leave a message with her neurologist regarding our recommendations for sleep study.   Will also check labs today to rule out metabolic cause.

## 2021-02-19 ENCOUNTER — Other Ambulatory Visit: Payer: Self-pay | Admitting: Primary Care

## 2021-02-19 DIAGNOSIS — E538 Deficiency of other specified B group vitamins: Secondary | ICD-10-CM

## 2021-02-19 DIAGNOSIS — E559 Vitamin D deficiency, unspecified: Secondary | ICD-10-CM

## 2021-02-19 MED ORDER — VITAMIN D (ERGOCALCIFEROL) 1.25 MG (50000 UNIT) PO CAPS: ORAL_CAPSULE | ORAL | 0 refills | Status: DC

## 2021-02-22 NOTE — Telephone Encounter (Signed)
[name], we need to initiate B12 injections for her level of 137 that was collected last week.  My proposed schedule is as follows:  B12 1000 mcg once weekly for 4 weeks, then every other week for 4 weeks, then once monthly thereafter.  We will need a repeat B12 lab test after 12 weeks.  Same with vitamin D. I will order, please set up a lab appointment.

## 2021-02-23 NOTE — Telephone Encounter (Signed)
Called patient all appointments made. Copy of schedule left at reception for her to pick up at first injection tomorrow.

## 2021-02-24 ENCOUNTER — Ambulatory Visit (INDEPENDENT_AMBULATORY_CARE_PROVIDER_SITE_OTHER): Payer: BC Managed Care – PPO | Admitting: *Deleted

## 2021-02-24 ENCOUNTER — Other Ambulatory Visit: Payer: Self-pay

## 2021-02-24 DIAGNOSIS — E538 Deficiency of other specified B group vitamins: Secondary | ICD-10-CM | POA: Diagnosis not present

## 2021-02-24 MED ORDER — CYANOCOBALAMIN 1000 MCG/ML IJ SOLN
1000.0000 ug | Freq: Once | INTRAMUSCULAR | Status: AC
  Administered 2021-02-24: 1000 ug via INTRAMUSCULAR

## 2021-02-24 NOTE — Progress Notes (Signed)
Per orders of Silvestre Moment, injection of Vitamin B12 in Left Deltoid given by Lauralyn Primes.  Patient tolerated injection well.

## 2021-03-02 NOTE — Telephone Encounter (Signed)
Called office spoke to [place]. She as added to note for that visit.

## 2021-03-03 ENCOUNTER — Other Ambulatory Visit: Payer: Self-pay

## 2021-03-03 ENCOUNTER — Ambulatory Visit (INDEPENDENT_AMBULATORY_CARE_PROVIDER_SITE_OTHER): Payer: BC Managed Care – PPO | Admitting: *Deleted

## 2021-03-03 DIAGNOSIS — E538 Deficiency of other specified B group vitamins: Secondary | ICD-10-CM

## 2021-03-03 MED ORDER — CYANOCOBALAMIN 1000 MCG/ML IJ SOLN
1000.0000 ug | Freq: Once | INTRAMUSCULAR | Status: AC
  Administered 2021-03-03: 1000 ug via INTRAMUSCULAR

## 2021-03-03 NOTE — Progress Notes (Signed)
Per orders of Allie Bossier, NP, injection of B12 given by Tammi Sou, in the Right deltoid. Patient tolerated injection well.

## 2021-03-10 ENCOUNTER — Ambulatory Visit (INDEPENDENT_AMBULATORY_CARE_PROVIDER_SITE_OTHER): Payer: BC Managed Care – PPO

## 2021-03-10 ENCOUNTER — Other Ambulatory Visit: Payer: Self-pay

## 2021-03-10 DIAGNOSIS — E538 Deficiency of other specified B group vitamins: Secondary | ICD-10-CM | POA: Diagnosis not present

## 2021-03-10 MED ORDER — CYANOCOBALAMIN 1000 MCG/ML IJ SOLN
1000.0000 ug | Freq: Once | INTRAMUSCULAR | Status: AC
  Administered 2021-03-10: 1000 ug via INTRAMUSCULAR

## 2021-03-10 NOTE — Progress Notes (Signed)
Per orders of Dr. Waunita Schooner , injection of B-12 given by Francella Solian in left deltoid. Patient tolerated injection well. Patient has made appointment for 1 week.

## 2021-03-11 NOTE — Telephone Encounter (Signed)
Agree with OV to check blood pressure.

## 2021-03-11 NOTE — Telephone Encounter (Signed)
I spoke with pt; pt got vitamin B 12 injection that was third b12 shot. After getting b12 injection about 10 mins after getting shot pt had nose bleed for about 15 - 20 mins. Now pt has H/A and pt has lightheadedness and at times pt feels like she is spinning. Pt does not have way to ck BP; no CP or SOB,no rash or itching, and no N&V or diarrhea. Pt is concerned because has not had nose bleed in many years and now the H/A and lightheadedness is really bothering pt. Pt said the first two B 12 injections did not bother her. No available appts at [facility] or [facility] and pt will go to [facility] on [address] in [place] sometime today. Pt is presently at work. Advised pt due to lightheadedness should not drive. Pt said she does not have anyone to drive her and she drove to work and did fine. Sending note to Gentry Fitz NP, Dr Diona Browner and Lavina Hamman CMA.

## 2021-03-11 NOTE — Telephone Encounter (Signed)
Please call patient and triage.

## 2021-03-14 ENCOUNTER — Ambulatory Visit: Payer: BC Managed Care – PPO | Admitting: Family Medicine

## 2021-03-14 ENCOUNTER — Encounter: Payer: Self-pay | Admitting: Family Medicine

## 2021-03-14 VITALS — BP 127/78 | HR 66 | Ht 65.0 in | Wt 175.0 lb

## 2021-03-14 DIAGNOSIS — R519 Headache, unspecified: Secondary | ICD-10-CM

## 2021-03-14 DIAGNOSIS — G43009 Migraine without aura, not intractable, without status migrainosus: Secondary | ICD-10-CM

## 2021-03-14 DIAGNOSIS — G40B09 Juvenile myoclonic epilepsy, not intractable, without status epilepticus: Secondary | ICD-10-CM

## 2021-03-14 MED ORDER — PHENOBARBITAL 97.2 MG PO TABS: 194.4000 mg | ORAL_TABLET | Freq: Every day | ORAL | 1 refills | Status: DC

## 2021-03-14 NOTE — Patient Instructions (Signed)
Below is our plan:  We will get you back in with Dr Rexene Alberts. I am concerned about sleep apnea. I have given you some material to review. Try to wean Tylenol.   Please make sure you are staying well hydrated. I recommend 50-60 ounces daily. Well balanced diet and regular exercise encouraged. Consistent sleep schedule with 6-8 hours recommended.   Please continue follow up with care team as directed.   Follow up with me pending sleep report   You may receive a survey regarding today's visit. I encourage you to leave honest feed back as I do use this information to improve patient care. Thank you for seeing me today!   Discussed: To prevent or relieve headaches, try the following: Cool Compress. Lie down and place a cool compress on your head.  Avoid headache triggers. If certain foods or odors seem to have triggered your migraines in the past, avoid them. A headache diary might help you identify triggers.  Include physical activity in your daily routine. Try a daily walk or other moderate aerobic exercise.  Manage stress. Find healthy ways to cope with the stressors, such as delegating tasks on your to-do list.  Practice relaxation techniques. Try deep breathing, yoga, massage and visualization.  Eat regularly. Eating regularly scheduled meals and maintaining a healthy diet might help prevent headaches. Also, drink plenty of fluids.  Follow a regular sleep schedule. Sleep deprivation might contribute to headaches Consider biofeedback. With this mind-body technique, you learn to control certain bodily functions -- such as muscle tension, heart rate and blood pressure -- to prevent headaches or reduce headache pain.

## 2021-03-14 NOTE — Progress Notes (Signed)
Chief Complaint  Patient presents with   Follow-up    Rm 2, alone. Here for yearly migraine f/u. Pt states she has constant HA. Last migraine has been about a month. Wakes up with HA and comes and goes throughout the day. Takes Tylenol 2 500mg  at every onset.      HISTORY OF PRESENT ILLNESS: 03/14/21 ALL:  [name] is a 50 y.o. female here today for follow up for headaches and seizures. She continues phenobarbital 2 tablets daily. No recent seizures. She continues to have daily headaches. Most occur in the mornings or when she wakes from sleep. She was seen by PCP for excessive daytime sleepiness. She was missing work due to not being able to wake up. She is taking Tylenol daily, usually about 8 - 500mg  tablets. She has about 1 migraine per month. Rizatriptan usually aborts migraine. She was told by her late husband that she snores. She denies family history of sleep apnea. She was seen by Dr Rexene Alberts in 2017 and sleep study advised but she did not pursue.    HISTORY (copied from Dr Gladstone Lighter previous note)  UPDATE (02/09/20, VRP): Since last visit, doing about the same. Daily low grade HA, but rare migraine. No longer on ajovy (hard to take shots on her own). Symptoms are stable. No seizures.   PRIOR HPI (12/25/18): - doing well; no seizures - HA are improving on ajovy - unfortunately patient's husband deceased now (from cancer) - overall stable   UPDATE (12/31/17, VRP): Since last visit, doing the same with HA (daily basis). Tried TPX, but HA worse. Rizatriptan helps. Tried lower PB, but had muscle spasms / myoclonus, but no grand mal seizures.    UPDATE (09/02/16, VRP): Since last visit, doing well. No seizures. Tolerating meds. No alleviating or aggravating factors. In last 3 weeks has had more HA / migraines (daily). Rizatriptan and tylenol not helping that much. Had cut down tylenol for a while, but now back up to 6 tabs per day. Drinking 1 soda per day. Eating 3 times per  day. Sleep is a little better.   UPDATE (09/02/16, VRP): Since last visit, doing well --> no seizures. Tolerating meds. No alleviating or aggravating factors. Has had 3 weeks of consistent headaches. Taking up to tylenol x 6 tabs per day.   UPDATE 06/26/16: Since last visit, doing well. No seizures. Sleep is improved on her own. Tried on amitriptyline, but HA felt worse, so she stopped it. Having 2-3 migraine per month, well controlled with rizatriptan.   UPDATE 12/24/15: Since last visit, no seizures. Still with daily headaches. Also with 1 severe migraine per month. Eating 2 x per day. Soda (1 per day). Still with 6 tylenol tabs per day. Amitriptyline helped some in the beginning, but seems to be less effective now.   UPDATE 10/22/15: Since last visit, no seizures. Has cut down soda (now 1-2 per day), has quit smoking, and now exercising some. Still only eats 1 meal per day. Still taking 5-9 tabs tylenol on daily basis. Has 1 migraine per month (pounding global HA, sens to light / sound). Also with daily lower level headaches. Still poor sleep quality.   UPDATE 10/21/14: Since last visit, no seizures. C/o daily headaches, and taking daily Tylenol. She has interrupted sleep at nighttime. She continues to drink 5-6 Dr. Malachi Bowen cans per day. She eats once per day. She has increased stress related to work. Fortunately patient has been able to quit smoking cigarettes since  08/28/2014.   UPDATE 10/21/13: Since last visit, no new events or seizures. Now has [facility], but no PCP. Tolerating PB 97.2mg  tabs (2 tabs qAM).   UPDATE 05/29/12: No seizure. No PCP. No insurance.   UPDATE 12/22/10: No seizure since age 37's.  Tolerating PB, and wants to stay on it.  Having 1 migraine every 1-2 years (global, severe, pressure, photophobia, phonophobia).  Usually midrin helps.  Never tried triptan.  Also using tylenol for tension HA.  Last migraine Aug 2011.   PRIOR HPI (12/15/08, Dr. Gaynell Face): "[name] is a 50 year old  married woman with a long-standing history of juvenile myoclonic epilepsy.  She has not had seizures for years.  The only manifestation of her symptoms has been generalized tonic-clonic seizures.  She has taken and tolerated Phenobarbital without significant side effects. Patient has daily headaches that I think are more tension type in nature than migraine.  Become on later in the day except when she has stress. Since her last visit, she had hysterectomy and oophorectomy for removal of an ovarian cyst.  She was noted to have scarred fallopian tubes at the time of operation."   REVIEW OF SYSTEMS: Out of a complete 14 system review of symptoms, the patient complains only of the following symptoms, headaches, anxiety and all other reviewed systems are negative.   ALLERGIES: Allergies  Allergen Reactions   Asa Buff (Mag [Buffered Aspirin] Other (See Comments)    Stomach pain   Azithromycin Other (See Comments)    GI problems   Ibuprofen Other (See Comments)    GI upset   Penicillins Other (See Comments)    Unknown childhood allergic reaction Has patient had a PCN reaction causing immediate rash, facial/tongue/throat swelling, SOB or lightheadedness with hypotension: Unknown Has patient had a PCN reaction causing severe rash involving mucus membranes or skin necrosis: Unknown Has patient had a PCN reaction that required hospitalization: Unknown Has patient had a PCN reaction occurring within the last 10 years: No If all of the above answers are "NO", then may proceed with Cephalosporin use.     HOME MEDICATIONS: Outpatient Medications Prior to Visit  Medication Sig Dispense Refill   albuterol (VENTOLIN HFA) 108 (90 Base) MCG/ACT inhaler Inhale 1-2 puffs into the lungs every 6 (six) hours as needed for wheezing or shortness of breath. 18 g 0   escitalopram (LEXAPRO) 20 MG tablet TAKE 1 TABLET BY MOUTH ONCE DAILY FOR ANXIETY 90 tablet 3   fluticasone furoate-vilanterol (BREO ELLIPTA) 200-25  MCG/INH AEPB Inhale 1 puff into the lungs daily. For asthma 28 each 0   ondansetron (ZOFRAN) 4 MG tablet Take 1 tablet (4 mg total) by mouth every 8 (eight) hours as needed for nausea or vomiting. 20 tablet 0   rizatriptan (MAXALT-MLT) 10 MG disintegrating tablet Take 1 tablet (10 mg total) by mouth as needed for migraine. May repeat in 2 hours if needed 9 tablet 5   Vitamin D, Ergocalciferol, (DRISDOL) 1.25 MG (50000 UNIT) CAPS capsule Take 1 capsule by mouth once weekly for 12 weeks. 12 capsule 0   PHENobarbital (LUMINAL) 97.2 MG tablet Take 2 tablets by mouth once daily 180 tablet 1   No facility-administered medications prior to visit.     PAST MEDICAL HISTORY: Past Medical History:  Diagnosis Date   Asthma    worse when younger   GERD (gastroesophageal reflux disease)    Migraine    1-2x/month   Persistent cough for 3 weeks or longer 10/09/2019   PONV (postoperative  nausea and vomiting)    Seizure ([place])    Vertigo    no episodes for 7 yrs     PAST SURGICAL HISTORY: Past Surgical History:  Procedure Laterality Date   ABDOMINAL HYSTERECTOMY  2012   COLONOSCOPY WITH PROPOFOL N/A 04/24/2019   Procedure: COLONOSCOPY WITH PROPOFOL;  Surgeon: Virgel Manifold, MD;  Location: [facility] ENDOSCOPY;  Service: Endoscopy;  Laterality: N/A;   ESOPHAGOGASTRODUODENOSCOPY (EGD) WITH PROPOFOL N/A 04/24/2019   Procedure: ESOPHAGOGASTRODUODENOSCOPY (EGD) WITH PROPOFOL;  Surgeon: Virgel Manifold, MD;  Location: [facility] ENDOSCOPY;  Service: Endoscopy;  Laterality: N/A;   HAND SURGERY Right 05/07/2020   trigger release     FAMILY HISTORY: Family History  Problem Relation Age of Onset   Hypertension Mother    Colon cancer Neg Hx    Esophageal cancer Neg Hx      SOCIAL HISTORY: Social History   Socioeconomic History   Marital status: Widowed    Spouse name: [name]   Number of children: 0   Years of education: 10th   Highest education level: Not on file  Occupational History     Employer: [id number]  Tobacco Use   Smoking status: Former    Packs/day: 1.00    Types: Cigarettes    Quit date: 08/28/2013    Years since quitting: 7.5   Smokeless tobacco: Never   Tobacco comments:    Quit Janurary 1st  Vaping Use   Vaping Use: Never used  Substance and Sexual Activity   Alcohol use: No    Alcohol/week: 0.0 standard drinks   Drug use: No   Sexual activity: Not on file  Other Topics Concern   Not on file  Social History Narrative   Patient lives alone   No children.   Works at [employer].   Enjoys going to [employer], going to estate sells.   Right handed   Caffeine: 1 Dr. Malachi Bowen every morning   Social Determinants of Health   Financial Resource Strain: Not on file  Food Insecurity: Not on file  Transportation Needs: Not on file  Physical Activity: Not on file  Stress: Not on file  Social Connections: Not on file  Intimate Partner Violence: Not on file     PHYSICAL EXAM  Vitals:   03/14/21 1505  BP: 127/78  Pulse: 66  Weight: 175 lb (79.4 kg)  Height: 5\' 5"  (1.651 m)   Body mass index is 29.12 kg/m.   Generalized: Well developed, in no acute distress  Cardiology: normal rate and rhythm, no murmur auscultated  Respiratory: clear to auscultation bilaterally    Neurological examination  Mentation: Alert oriented to time, place, history taking. Follows all commands speech and language fluent Cranial nerve II-XII: Pupils were equal round reactive to light. Extraocular movements were full, visual field were full on confrontational test. Facial sensation and strength were normal. Uvula tongue midline. Head turning and shoulder shrug  were normal and symmetric. Motor: The motor testing reveals 5 over 5 strength of all 4 extremities. Good symmetric motor tone is noted throughout.  Sensory: Sensory testing is intact to soft touch on all 4 extremities. No evidence of extinction is noted.  Coordination: Cerebellar testing reveals good finger-nose-finger and  heel-to-shin bilaterally.  Gait and station: Gait is normal. Tandem gait is normal. Romberg is negative. No drift is seen.  Reflexes: Deep tendon reflexes are symmetric and normal bilaterally.    DIAGNOSTIC DATA (LABS, IMAGING, TESTING) - I reviewed patient records, labs, notes, testing and imaging myself where  available.  Lab Results  Component Value Date   WBC 7.8 02/18/2021   HGB 14.1 02/18/2021   HCT 40.9 02/18/2021   MCV 93.7 02/18/2021   PLT 210.0 02/18/2021      Component Value Date/Time   NA 138 10/27/2020 1317   NA 140 10/22/2015 1206   K 3.7 10/27/2020 1317   CL 103 10/27/2020 1317   CO2 30 10/27/2020 1317   GLUCOSE 92 10/27/2020 1317   BUN 9 10/27/2020 1317   BUN 9 10/22/2015 1206   CREATININE 0.76 10/27/2020 1317   CREATININE 0.63 11/30/2017 1549   CALCIUM 9.2 10/27/2020 1317   PROT 7.2 10/27/2020 1317   PROT 6.5 10/22/2015 1206   ALBUMIN 4.1 10/27/2020 1317   ALBUMIN 4.1 10/22/2015 1206   AST 15 10/27/2020 1317   ALT 16 10/27/2020 1317   ALKPHOS 89 10/27/2020 1317   BILITOT 0.3 10/27/2020 1317   BILITOT 0.2 10/22/2015 1206   GFRNONAA >60 01/08/2020 2145   GFRAA >60 01/08/2020 2145   Lab Results  Component Value Date   CHOL 233 (H) 10/27/2020   HDL 48.20 10/27/2020   LDLCALC 117 (H) 11/30/2017   LDLDIRECT 137.0 10/27/2020   TRIG 229.0 (H) 10/27/2020   CHOLHDL 5 10/27/2020   Lab Results  Component Value Date   HGBA1C 5.4 08/13/2019   Lab Results  Component Value Date   VITAMINB12 137 (L) 02/18/2021   Lab Results  Component Value Date   TSH 1.44 02/18/2021    No flowsheet data found.   No flowsheet data found.   ASSESSMENT AND PLAN  50 y.o. year old female  has a past medical history of Asthma, GERD (gastroesophageal reflux disease), Migraine, Persistent cough for 3 weeks or longer (10/09/2019), PONV (postoperative nausea and vomiting), Seizure ([place]), and Vertigo. here with    Migraine without aura and without status migrainosus, not  intractable - Plan: Ambulatory referral to Sleep Studies  Juvenile myoclonic epilepsy, not intractable, without status epilepticus ([place]) - Plan: Phenobarbital level, PHENobarbital (LUMINAL) 97.2 MG tablet  Morning headache - Plan: Ambulatory referral to Sleep Studies  Rie is doing well from a seizure perspective. We will update labs and continue phenobarbital 194.4mg  daily. She was encouraged to consider sleep study for evaluation for possible sleep apnea. She is in agreement and referral placed, today. We have reviewed concerns of possible analgesic rebound headaches. She was encouraged to wean OTC analgesic use. Complementary therapies reviewed for headache management. Healthy lifestyle habits encouraged. She will follow up with me pending sleep study results. She verbalizes understanding and agreement with this plan.    Orders Placed This Encounter  Procedures   Phenobarbital level   Ambulatory referral to Sleep Studies    Referral Priority:   Routine    Referral Type:   Consultation    Referral Reason:   Specialty Services Required    Number of Visits Requested:   1     Meds ordered this encounter  Medications   PHENobarbital (LUMINAL) 97.2 MG tablet    Sig: Take 2 tablets (194.4 mg total) by mouth daily.    Dispense:  180 tablet    Refill:  1    Order Specific Question:   Supervising Provider    Answer:   Melvenia Beam [[id number]]      Debbora Presto, MSN, FNP-C 03/14/2021, 4:04 PM  [facility] [address] [place], [place] [address] [phone]

## 2021-03-15 LAB — PHENOBARBITAL LEVEL: Phenobarbital, Serum: 27 ug/mL (ref 15–40)

## 2021-03-17 ENCOUNTER — Ambulatory Visit: Payer: BC Managed Care – PPO

## 2021-03-24 ENCOUNTER — Ambulatory Visit: Payer: BC Managed Care – PPO | Admitting: Gastroenterology

## 2021-03-24 ENCOUNTER — Encounter: Payer: Self-pay | Admitting: Gastroenterology

## 2021-03-24 VITALS — BP 114/66 | HR 72 | Ht 65.0 in | Wt 178.1 lb

## 2021-03-24 DIAGNOSIS — K2100 Gastro-esophageal reflux disease with esophagitis, without bleeding: Secondary | ICD-10-CM | POA: Diagnosis not present

## 2021-03-24 DIAGNOSIS — K5901 Slow transit constipation: Secondary | ICD-10-CM

## 2021-03-24 MED ORDER — SUTAB 1479-225-188 MG PO TABS: 1.0000 | ORAL_TABLET | ORAL | 0 refills | Status: DC

## 2021-03-24 MED ORDER — PANTOPRAZOLE SODIUM 40 MG PO TBEC: 40.0000 mg | DELAYED_RELEASE_TABLET | Freq: Two times a day (BID) | ORAL | 3 refills | Status: DC

## 2021-03-24 NOTE — Patient Instructions (Addendum)
It was my pleasure to provide care to you today. Based on our discussion, I am providing you with my recommendations below:  RECOMMENDATION(S):   Take Miralax  1 capful 2 times daily 5 days PRIOR to starting your prep  PRESCRIPTION MEDICATION(S):   We have sent the following medication(s) to your pharmacy:  Pantoprazole - please take 2 times daily; ADD Pepcid if symptoms DO NOT respond within 2 weeks of Pantoprazole use  NOTE: If your medication(s) requires a PRIOR AUTHORIZATION, we will receive notification from your pharmacy. Once received, the process to submit for approval may take up to 7-10 business days. You will be contacted about any denials we have received from your insurance company as well as alternatives recommended by your provider.  I am sorry to hear that your reflux has been so bad. I would like for you to start taking pantoprazole 40 mg twice daily. Please notify me after 2 weeks if you are not feeling better.  Modifying diet and lifestyle remains the foundation for treating the symptoms of reflux.   The following strategies help you prevent heartburn and other symptoms by avoiding foods that reduce the effectiveness of the bottom of the esophagus from protecting the esophagus from from acid injury and keeping stomach contents where they belong.  Eat smaller meals. A large meal remains in the stomach for several hours, increasing the chances for gastroesophageal reflux. Try distributing your daily food intake over three, four, or five smaller meals.  Relax when you eat. Stress increases the production of stomach acid, so make meals a pleasant, relaxing experience. Sit down. Eat slowly. Chew completely. Play soothing music.  Relax between meals. Relaxation therapies such as deep breathing, meditation, massage, tai chi, or yoga may help prevent and relieve heartburn.   Remain upright after eating. You should maintain postures that reduce the risk for reflux for at least  three hours after eating. For example, don't bend over or strain to lift heavy objects.  Avoid eating within three hours of going to bed. Lying down after eating will increase chances of reflux.  Lose weight. Excess pounds increase pressure on the stomach and can push acid into the esophagus.  Loosen up. Avoid tight belts, waistbands, and other clothing that puts pressure on your stomach.  Avoid foods that burn. Abstain from food or drink that increases gastric acid secretion, decreases the valve at the bottom of the esophagus, or slows the emptying of the stomach. Known offenders include high-fat foods, spicy dishes, tomatoes and tomato products, citrus fruits, garlic, onions, milk, carbonated drinks, coffee (including decaf), tea, chocolate, mints, and alcohol.  Avoid medications that can predispose you to reflux including aspirin and other NSAIDs, oral contraceptives, hormone therapy drugs, and certain antidepressants.  Sleep at an angle. If you're bothered by nighttime heartburn, place a wedge (available in medical supply stores or a wedge pillow through [employer]) under your upper body. But don't elevate your head with extra pillows. That makes reflux worse by bending you at the waist and compressing your stomach. You might also try sleeping on your left side, as studies have shown this can help--perhaps because the stomach is on the left side of the body, so lying on your left positions most of the stomach below the bottom of the esophagus.   COLONOSCOPY:   You have been scheduled for a colonoscopy. Please follow written instructions given to you at your visit today.   PREP:   Please pick up your prep supplies at the  pharmacy within the next 1-3 days.  INHALERS:   If you use inhalers (even only as needed), please bring them with you on the day of your procedure.  COLONOSCOPY TIPS:  To reduce nausea and dehydration, stay well hydrated for 3-4 days prior to the exam.  To prevent  skin/hemorrhoid irritation - prior to wiping, put A&Dointment or vaseline on the toilet paper. Keep a towel or pad on the bed.  BEFORE STARTING YOUR PREP, drink  64oz of clear liquids in the morning. This will help to flush the colon and will ensure you are well hydrated!!!!  NOTE - This is in addition to the fluids required for to complete your prep. Use of a flavored hard candy, such as grape Anise Salvo, can counteract some of the flavor of the prep and may prevent some nausea.   FOLLOW UP:  After your procedure, you will receive a call from my office staff regarding my recommendation for follow up.  BMI:  If you are age 12 or younger, your body mass index should be between 19-25. Your Body mass index is 29.64 kg/m. If this is out of the aformentioned range listed, please consider follow up with your Primary Care Provider.   MY CHART:  The  GI providers would like to encourage you to use [facility] to communicate with providers for non-urgent requests or questions.  Due to long hold times on the telephone, sending your provider a message by [facility] may be a faster and more efficient way to get a response.  Please allow 48 business hours for a response.  Please remember that this is for non-urgent requests.   Thank you for trusting me with your gastrointestinal care!    Thornton Park, MD, MPH

## 2021-03-24 NOTE — Progress Notes (Signed)
Referring Provider: Pleas Koch, NP Primary Care Physician:  Sandy Koch, NP  Chief complaint:  Abdominal pain and constipation   IMPRESSION:  - Constipation with failed attempted at colonoscopy 2020 due to prep intolerance       - did not tolerate Linzess due to associated diarrhea - Worsening reflux with associated nausea and vomiting - History of odynophagia with globus with history of LA Class A reflux esophagitis on EGD 2020 - Epigastric abdominal pain - may due to reflux after review EGD and CT scan  PLAN: - Pantoprazole 40 mg BID - She will contact me with an update in 2 weeks - Add Pepcid 20 mg BID if not responding to 2 weeks of pantoprazole - Colonoscopy with Miralax/Gatorade and SuTab two day bowel prep - Take Miralax BID x 5 days prior to the bowel prep     Please see the "Patient Instructions" section for addition details about the plan.  HPI: [name] is a 50 y.o. female who returns in follow-up. I saw her in consultation 05/17/20 . Previously seen and evaluated by Dr. Bonna Gains, last 06/2019. She has anxiety, asthma, daily headaches and a history of seizures (last 1990). She works at [employer].   She had several ongoing GI concerns at the time of her consultation 05/17/20 including reflux and odynophagia, intermittent epigastric abdominal pain, and constipation frequently going a week between bowel movements. Thought she had a small hiatal hernia, although no one seems to be able to find it now. Has gained 50 pounds since her husband died last year.   Returns today reporting acid reflux with brash, regurgitation, nausea, and vomiting. She feels like the pain is more severe than in the past. Sometimes flares even when just walking. Uses TUMS without any relief. Has not been using any prescription medications, although she prefers to avoid them. No dysphagia, odynophagia, neck pain, sore throat.    Stopped a trial of Linzess because of the  resulting diarrhea.    Endoscopic history: - Endoscopic evaluation with Dr. Mariann Laster:  - Poor prep on colonoscopy 04/24/2019. Repeat next available recommended.  - EGD 04/24/19:    - LA Grade A reflux esophagitis.    - White nummular lesions in esophageal mucosa. Brushings performed. Biopsies showed mild acanthosis.     - Normal stomach. Normal gastric biopsies.     - Normal duodenum.  Recent abdominal imaging: CT of the abd/pelvis 01/09/20 showed no acute findings.  Sister with IBS. No known family history of colon cancer or polyps. No family history of uterine/endometrial cancer, pancreatic cancer or gastric/stomach cancer.  Past Medical History:  Diagnosis Date   Asthma    worse when younger   GERD (gastroesophageal reflux disease)    Migraine    1-2x/month   Persistent cough for 3 weeks or longer 10/09/2019   PONV (postoperative nausea and vomiting)    Seizure ([place])    Vertigo    no episodes for 7 yrs    Past Surgical History:  Procedure Laterality Date   ABDOMINAL HYSTERECTOMY  2012   COLONOSCOPY WITH PROPOFOL N/A 04/24/2019   Procedure: COLONOSCOPY WITH PROPOFOL;  Surgeon: Virgel Manifold, MD;  Location: [facility] END[facility]COPY;  Service: Endoscopy;  Laterality: N/A;   ESOPHAGOGASTRODUODENOSCOPY (EGD) WITH PROPOFOL N/A 04/24/2019   Procedure: ESOPHAGOGASTRODUODENOSCOPY (EGD) WITH PROPOFOL;  Surgeon: Virgel Manifold, MD;  Location: [facility] ENDOSCOPY;  Service: Endoscopy;  Laterality: N/A;   HAND SURGERY Right 05/07/2020   trigger release    Current  Outpatient Medications  Medication Sig Dispense Refill   albuterol (VENTOLIN HFA) 108 (90 Base) MCG/ACT inhaler Inhale 1-2 puffs into the lungs every 6 (six) hours as needed for wheezing or shortness of breath. 18 g 0   escitalopram (LEXAPRO) 20 MG tablet TAKE 1 TABLET BY MOUTH ONCE DAILY FOR ANXIETY 90 tablet 3   fluticasone furoate-vilanterol (BREO ELLIPTA) 200-25 MCG/INH AEPB Inhale 1 puff into the lungs daily. For asthma 28  each 0   ondansetron (ZOFRAN) 4 MG tablet Take 1 tablet (4 mg total) by mouth every 8 (eight) hours as needed for nausea or vomiting. 20 tablet 0   PHENobarbital (LUMINAL) 97.2 MG tablet Take 2 tablets (194.4 mg total) by mouth daily. 180 tablet 1   rizatriptan (MAXALT-MLT) 10 MG disintegrating tablet Take 1 tablet (10 mg total) by mouth as needed for migraine. May repeat in 2 hours if needed 9 tablet 5   Vitamin D, Ergocalciferol, (DRISDOL) 1.25 MG (50000 UNIT) CAPS capsule Take 1 capsule by mouth once weekly for 12 weeks. 12 capsule 0   No current facility-administered medications for this visit.    Allergies as of 03/24/2021 - Review Complete 03/24/2021  Allergen Reaction Noted   Asa buff (mag [buffered aspirin] Other (See Comments) 10/14/2010   Azithromycin Other (See Comments) 06/19/2011   Ibuprofen Other (See Comments) 10/14/2010   Penicillins Other (See Comments) 10/14/2010    Family History  Problem Relation Age of Onset   Hypertension Mother    Colon cancer Neg Hx    Esophageal cancer Neg Hx       Physical Exam: General:   Alert,  well-nourished, pleasant and cooperative in NAD Head:  Normocephalic and atraumatic. Eyes:  Sclera clear, no icterus.   Conjunctiva pink. Abdomen:  Soft, nontender, nondistended, normal bowel sounds, no rebound or guarding. No hepatosplenomegaly.   Neurologic:  Alert and  oriented x4;  grossly nonfocal Skin:  Intact without significant lesions or rashes. Psych:  Alert and cooperative. Normal mood and affect.     Rohen Kimes L. Tarri Glenn, MD, MPH 03/24/2021, 1:32 PM

## 2021-03-28 ENCOUNTER — Other Ambulatory Visit: Payer: Self-pay

## 2021-03-28 ENCOUNTER — Other Ambulatory Visit: Payer: Self-pay | Admitting: Family Medicine

## 2021-03-28 ENCOUNTER — Encounter: Payer: Self-pay | Admitting: Family Medicine

## 2021-03-28 ENCOUNTER — Other Ambulatory Visit: Payer: BC Managed Care – PPO

## 2021-03-28 ENCOUNTER — Telehealth: Payer: BC Managed Care – PPO | Admitting: Family Medicine

## 2021-03-28 ENCOUNTER — Telehealth: Payer: Self-pay | Admitting: Family Medicine

## 2021-03-28 VITALS — Ht 65.0 in | Wt 175.0 lb

## 2021-03-28 DIAGNOSIS — Z20822 Contact with and (suspected) exposure to covid-19: Secondary | ICD-10-CM

## 2021-03-28 DIAGNOSIS — J4520 Mild intermittent asthma, uncomplicated: Secondary | ICD-10-CM

## 2021-03-28 NOTE — Progress Notes (Signed)
I connected with [name] on 03/28/21 at 12:20 PM EDT by video and verified that I am speaking with the correct person using two identifiers.   I discussed the limitations, risks, security and privacy concerns of performing an evaluation and management service by video and the availability of in person appointments. I also discussed with the patient that there may be a patient responsible charge related to this service. The patient expressed understanding and agreed to proceed.  Patient location: Home Provider Location: [place] Participants: [name] and Sharon Seller   Subjective:     [name] is a 50 y.o. female presenting for Headache, Sore Throat, Fever, and Cough     Headache  Associated symptoms include coughing (productive), ear pain, a fever, sinus pressure and a sore throat. Pertinent negatives include no nausea or vomiting.  Sore Throat  This is a new problem. Episode onset: 03/25/2021. The problem has been gradually worsening. Associated symptoms include congestion, coughing (productive), diarrhea, ear pain and headaches. Pertinent negatives include no shortness of breath or vomiting.  Fever  Associated symptoms include congestion, coughing (productive), diarrhea, ear pain, headaches and a sore throat. Pertinent negatives include no chest pain, nausea, vomiting or wheezing.  Cough Associated symptoms include chills, ear pain, a fever, headaches and a sore throat. Pertinent negatives include no chest pain, myalgias, shortness of breath or wheezing.   Sick contact - no  No loss of taste or smell Someone is bringing her a covid test - no energy to leave the house  Treatment: nyquil  Review of Systems  Constitutional:  Positive for chills and fever.  HENT:  Positive for congestion, ear pain, sinus pressure and sore throat.   Respiratory:  Positive for cough (productive). Negative for shortness of breath and wheezing.    Cardiovascular:  Negative for chest pain.  Gastrointestinal:  Positive for diarrhea. Negative for nausea and vomiting.  Musculoskeletal:  Negative for arthralgias and myalgias.  Neurological:  Positive for headaches.    Social History   Tobacco Use  Smoking Status Former   Packs/day: 1.00   Types: Cigarettes   Quit date: 08/28/2013   Years since quitting: 7.5  Smokeless Tobacco Never  Tobacco Comments   Quit Janurary 1st        Objective:   BP Readings from Last 3 Encounters:  03/24/21 114/66  03/14/21 127/78  02/18/21 120/68   Wt Readings from Last 3 Encounters:  03/28/21 175 lb (79.4 kg)  03/24/21 178 lb 2 oz (80.8 kg)  03/14/21 175 lb (79.4 kg)   Ht '5\' 5"'$  (1.651 m)   Wt 175 lb (79.4 kg)   BMI 29.12 kg/m   Physical Exam Constitutional:      Appearance: Normal appearance. She is not ill-appearing.     Comments: Bundled in blanket  HENT:     Head: Normocephalic and atraumatic.     Right Ear: External ear normal.     Left Ear: External ear normal.  Eyes:     Conjunctiva/sclera: Conjunctivae normal.  Pulmonary:     Effort: Pulmonary effort is normal. No respiratory distress.  Neurological:     Mental Status: She is alert. Mental status is at baseline.  Psychiatric:        Mood and Affect: Mood normal.        Behavior: Behavior normal.        Thought Content: Thought content normal.        Judgment: Judgment normal.  Assessment & Plan:   Problem List Items Addressed This Visit       Respiratory   Asthma - Primary   Other Visit Diagnoses     Suspected COVID-19 virus infection          Discussed OTC treatment for viral illness Patient will come today for drive-up testing Instructed to isolate until the results come back  Patient is at increased risk for developing severe covid due to asthma. They are eligible for anti-viral medication if results are positive from testing today.   Discussed molnupiravir if positive due to drug  interaction  Reviewed ER and return precautions  Reviewed isolation guidelines.      Return if symptoms worsen or fail to improve.  Sandy Noe, MD

## 2021-03-28 NOTE — Telephone Encounter (Signed)
Please call to triage. Teams sent to make aware as well.

## 2021-03-28 NOTE — Telephone Encounter (Signed)
Spoke to patient by telephone and was advised that she started with symptoms Friday. Patient stated that she has a sore throat, headache, fever and body aches. Patient denies SOB. Patient scheduled for a virtual visit with Dr. Einar Pheasant today 03/28/21 at 12:20 pm. Patient was given ER precautions and she verbalized understanding. Patient stated that she had scheduled thru my chart covid testing tomorrow. Advised patient that we do not just do covid testing.

## 2021-03-28 NOTE — Telephone Encounter (Signed)
See note from today

## 2021-03-29 ENCOUNTER — Encounter: Payer: Self-pay | Admitting: Family Medicine

## 2021-03-29 LAB — NOVEL CORONAVIRUS, NAA: SARS-CoV-2, NAA: NOT DETECTED

## 2021-03-29 LAB — SARS-COV-2, NAA 2 DAY TAT

## 2021-03-30 ENCOUNTER — Ambulatory Visit: Payer: BC Managed Care – PPO | Admitting: Primary Care

## 2021-04-07 ENCOUNTER — Ambulatory Visit: Payer: BC Managed Care – PPO

## 2021-04-14 ENCOUNTER — Other Ambulatory Visit: Payer: Self-pay | Admitting: Primary Care

## 2021-04-14 ENCOUNTER — Ambulatory Visit (INDEPENDENT_AMBULATORY_CARE_PROVIDER_SITE_OTHER): Payer: BC Managed Care – PPO | Admitting: *Deleted

## 2021-04-14 ENCOUNTER — Other Ambulatory Visit: Payer: Self-pay | Admitting: Anesthesiology

## 2021-04-14 ENCOUNTER — Other Ambulatory Visit: Payer: Self-pay

## 2021-04-14 DIAGNOSIS — E538 Deficiency of other specified B group vitamins: Secondary | ICD-10-CM | POA: Diagnosis not present

## 2021-04-14 DIAGNOSIS — Z1231 Encounter for screening mammogram for malignant neoplasm of breast: Secondary | ICD-10-CM

## 2021-04-14 MED ORDER — CYANOCOBALAMIN 1000 MCG/ML IJ SOLN
1000.0000 ug | Freq: Once | INTRAMUSCULAR | Status: AC
  Administered 2021-04-14: 1000 ug via INTRAMUSCULAR

## 2021-04-14 NOTE — Progress Notes (Signed)
Per orders of Jeneen Rinks [facility]) Charmian Muff, NP, injection of B12 given by Earleen Reaper M in right deltoid. Patient tolerated injection well.   Pt did note she had a nosebleed about 10 min after getting b12 inj last month. She did have 2 prior injections with no issus/nosebleeds. After speaking with Matt I gave pt option to wait until PCP was in office to discuss this with her or proceed with B12 inj given that nosebleeds are not a "typical" reaction. Pt chose to proceed with injection but I advise her to let us know if she has another nosebleed. Pt verbalized understanding.

## 2021-04-21 ENCOUNTER — Other Ambulatory Visit: Payer: Self-pay

## 2021-04-21 ENCOUNTER — Ambulatory Visit: Payer: BC Managed Care – PPO

## 2021-04-28 ENCOUNTER — Ambulatory Visit: Payer: BC Managed Care – PPO

## 2021-04-29 ENCOUNTER — Other Ambulatory Visit: Payer: Self-pay

## 2021-04-29 ENCOUNTER — Encounter: Payer: Self-pay | Admitting: Gastroenterology

## 2021-04-29 ENCOUNTER — Ambulatory Visit (AMBULATORY_SURGERY_CENTER): Payer: BC Managed Care – PPO | Admitting: Gastroenterology

## 2021-04-29 VITALS — BP 112/72 | HR 66 | Temp 95.9°F | Resp 16 | Ht 65.0 in | Wt 178.0 lb

## 2021-04-29 DIAGNOSIS — Z1211 Encounter for screening for malignant neoplasm of colon: Secondary | ICD-10-CM

## 2021-04-29 DIAGNOSIS — Z538 Procedure and treatment not carried out for other reasons: Secondary | ICD-10-CM

## 2021-04-29 DIAGNOSIS — Z539 Procedure and treatment not carried out, unspecified reason: Secondary | ICD-10-CM | POA: Diagnosis not present

## 2021-04-29 DIAGNOSIS — K5901 Slow transit constipation: Secondary | ICD-10-CM

## 2021-04-29 MED ORDER — SODIUM CHLORIDE 0.9 % IV SOLN: 500.0000 mL | Freq: Once | INTRAVENOUS | Status: DC

## 2021-04-29 NOTE — Progress Notes (Signed)
A and O x3. Report to RN. Tolerated MAC anesthesia well. 

## 2021-04-29 NOTE — Op Note (Signed)
[place] Patient Name: [name] Procedure Date: 04/29/2021 7:32 AM MRN: [id number] Endoscopist: Thornton Park MD, MD Age: 50 Referring MD:  Date of Birth: 11/10/1970 Gender: Female Account #: [id number] Procedure:                Colonoscopy Indications:              Screening for colorectal malignant neoplasm                           Failed attempt at colonoscopy 2020 at [facility] due to                            poor prep Medicines:                Monitored Anesthesia Care Procedure:                Pre-Anesthesia Assessment:                           - Prior to the procedure, a History and Physical                            was performed, and patient medications and                            allergies were reviewed. The patient's tolerance of                            previous anesthesia was also reviewed. The risks                            and benefits of the procedure and the sedation                            options and risks were discussed with the patient.                            All questions were answered, and informed consent                            was obtained. Prior Anticoagulants: The patient has                            taken no previous anticoagulant or antiplatelet                            agents. ASA Grade Assessment: II - A patient with                            mild systemic disease. After reviewing the risks                            and benefits, the patient was deemed in  satisfactory condition to undergo the procedure.                           After obtaining informed consent, the colonoscope                            was passed under direct vision. Throughout the                            procedure, the patient's blood pressure, pulse, and                            oxygen saturations were monitored continuously. The                            Olympus CF-HQ190L [id number]) Colonoscope was                             introduced through the anus with the intention of                            advancing to the ileum. The scope was advanced to                            the sigmoid colon before the procedure was aborted.                            Medications were given. The colonoscopy was                            technically difficult and complex due to inadequate                            bowel prep. The patient tolerated the procedure                            well. The quality of the bowel preparation was                            inadequate. Scope In: 8:14:10 AM Scope Out: 8:16:51 AM Total Procedure Duration: 0 hours 2 minutes 41 seconds  Findings:                 The perianal and digital rectal examinations were                            normal.                           A large amount of liquid semi-liquid stool was                            found in the entire colon, precluding visualization. Complications:            No immediate complications. Estimated Blood Loss:  Estimated blood loss: none. Impression:               - Preparation of the colon was inadequate.                           - Stool in the entire examined colon.                           - No specimens collected. Recommendation:           - Patient has a contact number available for                            emergencies. The signs and symptoms of potential                            delayed complications were discussed with the                            patient. Return to normal activities tomorrow.                            Written discharge instructions were provided to the                            patient.                           - Resume previous diet.                           - Continue present medications.                           - Repeat colonoscopy at the next available                            appointment because the bowel preparation was poor.                            Plan to  use Linzess daily for 3 days prior to 2 day                            bowel prep.                           - Emerging evidence supports eating a diet of                            fruits, vegetables, grains, calcium, and yogurt                            while reducing red meat and alcohol may reduce the                            risk of colon cancer.  Thornton Park MD, MD 04/29/2021 8:22:59 AM This report has been signed electronically.

## 2021-04-29 NOTE — Progress Notes (Signed)
Referring Provider: Pleas Koch, NP Primary Care Physician:  Pleas Koch, NP  Indication for procedure:  Abdominal pain and constipation   IMPRESSION:  Incomplete attempt at screening colonoscopy 2020 at [facility] due to poor prep Constipation        - did not tolerate Linzess due to associated diarrhea    PLAN: - Colonoscopy in the [place] today     Please see the "Patient Instructions" section for addition details about the plan.  HPI: [name] is a 50 y.o. female presents for colonoscopy.    She had several ongoing GI concerns at the time of her consultation 05/17/20 including reflux and odynophagia, intermittent epigastric abdominal pain, and constipation frequently going a week between bowel movements. Thought she had a small hiatal hernia, although no one seems to be able to find it now. Has gained 50 pounds since her husband died last year.   Returns today reporting acid reflux with brash, regurgitation, nausea, and vomiting. She feels like the pain is more severe than in the past. Sometimes flares even when just walking. Uses TUMS without any relief. Has not been using any prescription medications, although she prefers to avoid them. No dysphagia, odynophagia, neck pain, sore throat.    Stopped a trial of Linzess because of the resulting diarrhea.    Endoscopic history: - Endoscopic evaluation with Dr. Mariann Laster:  - Poor prep on colonoscopy 04/24/2019. Repeat next available recommended.  - EGD 04/24/19:    - LA Grade A reflux esophagitis.    - White nummular lesions in esophageal mucosa. Brushings performed. Biopsies showed mild acanthosis.     - Normal stomach. Normal gastric biopsies.     - Normal duodenum.  Recent abdominal imaging: CT of the abd/pelvis 01/09/20 showed no acute findings.  Sister with IBS. No known family history of colon cancer or polyps. No family history of uterine/endometrial cancer, pancreatic cancer or gastric/stomach  cancer.  Past Medical History:  Diagnosis Date   Allergy    Anxiety    Asthma    worse when younger   GERD (gastroesophageal reflux disease)    Migraine    1-2x/month   Persistent cough for 3 weeks or longer 10/09/2019   PONV (postoperative nausea and vomiting)    Seizure (Chloride)    Vertigo    no episodes for 7 yrs    Past Surgical History:  Procedure Laterality Date   ABDOMINAL HYSTERECTOMY  2012   COLONOSCOPY     COLONOSCOPY WITH PROPOFOL N/A 04/24/2019   Procedure: COLONOSCOPY WITH PROPOFOL;  Surgeon: Virgel Manifold, MD;  Location: [facility] ENDOSCOPY;  Service: Endoscopy;  Laterality: N/A;   ESOPHAGOGASTRODUODENOSCOPY (EGD) WITH PROPOFOL N/A 04/24/2019   Procedure: ESOPHAGOGASTRODUODENOSCOPY (EGD) WITH PROPOFOL;  Surgeon: Virgel Manifold, MD;  Location: [facility] ENDOSCOPY;  Service: Endoscopy;  Laterality: N/A;   HAND SURGERY Right 05/07/2020   trigger release   UPPER GASTROINTESTINAL ENDOSCOPY      Current Outpatient Medications  Medication Sig Dispense Refill   escitalopram (LEXAPRO) 20 MG tablet TAKE 1 TABLET BY MOUTH ONCE DAILY FOR ANXIETY 90 tablet 3   famotidine (PEPCID) 20 MG tablet Take 1 tablet (20 mg total) by mouth 2 (two) times daily. If no relief from use of Pantoprazole     pantoprazole (PROTONIX) 40 MG tablet Take 1 tablet (40 mg total) by mouth 2 (two) times daily. 180 tablet 3   PHENobarbital (LUMINAL) 97.2 MG tablet Take 2 tablets (194.4 mg total) by mouth daily. 180 tablet  1   Vitamin D, Ergocalciferol, (DRISDOL) 1.25 MG (50000 UNIT) CAPS capsule Take 1 capsule by mouth once weekly for 12 weeks. 12 capsule 0   albuterol (VENTOLIN HFA) 108 (90 Base) MCG/ACT inhaler Inhale 1-2 puffs into the lungs every 6 (six) hours as needed for wheezing or shortness of breath. 18 g 0   fluticasone furoate-vilanterol (BREO ELLIPTA) 200-25 MCG/INH AEPB Inhale 1 puff into the lungs daily. For asthma 28 each 0   ondansetron (ZOFRAN) 4 MG tablet Take 1 tablet (4 mg total) by  mouth every 8 (eight) hours as needed for nausea or vomiting. 20 tablet 0   rizatriptan (MAXALT-MLT) 10 MG disintegrating tablet Take 1 tablet (10 mg total) by mouth as needed for migraine. May repeat in 2 hours if needed 9 tablet 5   Current Facility-Administered Medications  Medication Dose Route Frequency Provider Last Rate Last Admin   0.9 %  sodium chloride infusion  500 mL Intravenous Once Thornton Park, MD        Allergies as of 04/29/2021 - Review Complete 04/29/2021  Allergen Reaction Noted   Asa buff (mag [buffered aspirin] Other (See Comments) 10/14/2010   Azithromycin Other (See Comments) 06/19/2011   Ibuprofen Other (See Comments) 10/14/2010   Penicillins Other (See Comments) 10/14/2010    Family History  Problem Relation Age of Onset   Hypertension Mother    Colon cancer Neg Hx    Esophageal cancer Neg Hx    Stomach cancer Neg Hx    Rectal cancer Neg Hx       Physical Exam: General:   Alert,  well-nourished, pleasant and cooperative in NAD Head:  Normocephalic and atraumatic. Eyes:  Sclera clear, no icterus.   Conjunctiva pink. Abdomen:  Soft, nontender, nondistended, normal bowel sounds, no rebound or guarding. No hepatosplenomegaly.   Neurologic:  Alert and  oriented x4;  grossly nonfocal Skin:  Intact without significant lesions or rashes. Psych:  Alert and cooperative. Normal mood and affect.     Julee Stoll L. Tarri Glenn, MD, MPH 04/29/2021, 8:08 AM

## 2021-04-29 NOTE — Addendum Note (Signed)
Addended by: Christell Constant F on: 04/29/2021 09:04 AM   Modules accepted: Orders

## 2021-04-29 NOTE — Patient Instructions (Signed)
You may resume regular diet and medications today. Return to your normal daily activities tomorrow, 04/30/21. When you are ready, please call to re-schedule colonoscopy.  The plan will be to to another 2 day prep with a different medication and adding Linzess a few days prior.   YOU HAD AN ENDOSCOPIC PROCEDURE TODAY AT [facility]:   Refer to the procedure report that was given to you for any specific questions about what was found during the examination.  If the procedure report does not answer your questions, please call your gastroenterologist to clarify.  If you requested that your care partner not be given the details of your procedure findings, then the procedure report has been included in a sealed envelope for you to review at your convenience later.  YOU SHOULD EXPECT: Some feelings of bloating in the abdomen. Passage of more gas than usual.  Walking can help get rid of the air that was put into your GI tract during the procedure and reduce the bloating. If you had a lower endoscopy (such as a colonoscopy or flexible sigmoidoscopy) you may notice spotting of blood in your stool or on the toilet paper. If you underwent a bowel prep for your procedure, you may not have a normal bowel movement for a few days.  Please Note:  You might notice some irritation and congestion in your nose or some drainage.  This is from the oxygen used during your procedure.  There is no need for concern and it should clear up in a day or so.  SYMPTOMS TO REPORT IMMEDIATELY:  Following lower endoscopy (colonoscopy or flexible sigmoidoscopy):  Excessive amounts of blood in the stool  Significant tenderness or worsening of abdominal pains  Swelling of the abdomen that is new, acute  Fever of 100F or higher    For urgent or emergent issues, a gastroenterologist can be reached at any hour by calling [phone]. Do not use MyChart messaging for urgent concerns.    DIET:  We do recommend a  small meal at first, but then you may proceed to your regular diet.  Drink plenty of fluids but you should avoid alcoholic beverages for 24 hours.  ACTIVITY:  You should plan to take it easy for the rest of today and you should NOT DRIVE or use heavy machinery until tomorrow (because of the sedation medicines used during the test).    FOLLOW UP: Our staff will call the number listed on your records 48-72 hours following your procedure to check on you and address any questions or concerns that you may have regarding the information given to you following your procedure. If we do not reach you, we will leave a message.  We will attempt to reach you two times.  During this call, we will ask if you have developed any symptoms of COVID 19. If you develop any symptoms (ie: fever, flu-like symptoms, shortness of breath, cough etc.) before then, please call [phone].  If you test positive for Covid 19 in the 2 weeks post procedure, please call and report this information to [place].    If any biopsies were taken you will be contacted by phone or by letter within the next 1-3 weeks.  Please call us at [phone] if you have not heard about the biopsies in 3 weeks.    SIGNATURES/CONFIDENTIALITY: You and/or your care partner have signed paperwork which will be entered into your electronic medical record.  These signatures attest to the fact that  that the information above on your After Visit Summary has been reviewed and is understood.  Full responsibility of the confidentiality of this discharge information lies with you and/or your care-partner.

## 2021-04-29 NOTE — Progress Notes (Signed)
VS completed by SM.  Reviewed and updated medical history.

## 2021-05-03 ENCOUNTER — Telehealth: Payer: Self-pay | Admitting: *Deleted

## 2021-05-03 NOTE — Telephone Encounter (Signed)
Attempted 2nd f/u phone call. Same thing as this morning, phone rings once and then goes to a busy dial tone. Again verified that number charted matched number on file under contacts.

## 2021-05-03 NOTE — Telephone Encounter (Signed)
Attempted f/u phone call. Rings once and then gives a busy dial tone. Verified correct phone number listed in contacts.

## 2021-05-05 ENCOUNTER — Encounter: Payer: Self-pay | Admitting: Primary Care

## 2021-05-05 ENCOUNTER — Ambulatory Visit: Payer: BC Managed Care – PPO | Admitting: Primary Care

## 2021-05-05 ENCOUNTER — Other Ambulatory Visit: Payer: Self-pay

## 2021-05-05 VITALS — BP 110/68 | HR 66 | Temp 98.2°F | Ht 65.0 in | Wt 173.0 lb

## 2021-05-05 DIAGNOSIS — L03211 Cellulitis of face: Secondary | ICD-10-CM | POA: Diagnosis not present

## 2021-05-05 DIAGNOSIS — M542 Cervicalgia: Secondary | ICD-10-CM | POA: Insufficient documentation

## 2021-05-05 DIAGNOSIS — E538 Deficiency of other specified B group vitamins: Secondary | ICD-10-CM | POA: Insufficient documentation

## 2021-05-05 DIAGNOSIS — E559 Vitamin D deficiency, unspecified: Secondary | ICD-10-CM | POA: Insufficient documentation

## 2021-05-05 HISTORY — DX: Cellulitis of face: L03.211

## 2021-05-05 HISTORY — DX: Cervicalgia: M54.2

## 2021-05-05 MED ORDER — CYANOCOBALAMIN 1000 MCG/ML IJ SOLN
1000.0000 ug | Freq: Once | INTRAMUSCULAR | Status: AC
  Administered 2021-05-05: 1000 ug via INTRAMUSCULAR

## 2021-05-05 MED ORDER — CYCLOBENZAPRINE HCL 5 MG PO TABS: 5.0000 mg | ORAL_TABLET | Freq: Three times a day (TID) | ORAL | 0 refills | Status: DC | PRN

## 2021-05-05 MED ORDER — SULFAMETHOXAZOLE-TRIMETHOPRIM 800-160 MG PO TABS: 1.0000 | ORAL_TABLET | Freq: Two times a day (BID) | ORAL | 0 refills | Status: DC

## 2021-05-05 NOTE — Progress Notes (Signed)
Subjective:    Patient ID: [name], female    DOB: Jul 21, 1971, 50 y.o.   MRN: [id number]  HPI  [name] is a very pleasant 50 y.o. female with a history of migraines, trigger finger, bilateral hand pain, chronic foot pain, back pain who presents today to discuss neck pain and right sided facial swelling.  She is also due for B12 injection.   Her neck pain is located to the right lateral neck which began about three weeks ago, radiates to right parietal lobe on occasion. Her pain is constant, wose when turning her neck to the right side. Symptoms occur daily. She denies injury/trauma, numbness to right upper extremity, left neck pain. She's tried Tylenol without improvement.   Two days ago she began to notice swelling to the right cheek. This morning she woke up and noticed increased swelling, redness, and itching to her cheek and eye. She denies facial pain, dental pain, dental caries, eye drainage, changes to soaps/detergents/food/make up products.   BP Readings from Last 3 Encounters:  05/05/21 110/68  04/29/21 112/72  03/24/21 114/66     Review of Systems  Constitutional:  Negative for fever.  HENT:  Negative for dental problem and sore throat.   Musculoskeletal:  Positive for myalgias and neck pain.  Skin:  Positive for color change.  Neurological:  Positive for headaches.        Past Medical History:  Diagnosis Date   Allergy    Anxiety    Asthma    worse when younger   GERD (gastroesophageal reflux disease)    Migraine    1-2x/month   Persistent cough for 3 weeks or longer 10/09/2019   PONV (postoperative nausea and vomiting)    Seizure ([place])    Vertigo    no episodes for 7 yrs    Social History   Socioeconomic History   Marital status: Widowed    Spouse name: [name]   Number of children: 0   Years of education: 10th   Highest education level: Not on file  Occupational History    Employer: [id number]  Tobacco Use   Smoking  status: Former    Packs/day: 1.00    Types: Cigarettes    Quit date: 08/28/2013    Years since quitting: 7.6   Smokeless tobacco: Never   Tobacco comments:    Quit Janurary 1st  Vaping Use   Vaping Use: Never used  Substance and Sexual Activity   Alcohol use: No    Alcohol/week: 0.0 standard drinks   Drug use: No   Sexual activity: Not Currently    Birth control/protection: Surgical  Other Topics Concern   Not on file  Social History Narrative   Patient lives alone   No children.   Works at [employer].   Enjoys going to [employer], going to estate sells.   Right handed   Caffeine: 1 Dr. [name] every morning   Social Determinants of Health   Financial Resource Strain: Not on file  Food Insecurity: Not on file  Transportation Needs: Not on file  Physical Activity: Not on file  Stress: Not on file  Social Connections: Not on file  Intimate Partner Violence: Not on file    Past Surgical History:  Procedure Laterality Date   ABDOMINAL HYSTERECTOMY  2012   COLONOSCOPY     COLONOSCOPY WITH PROPOFOL N/A 04/24/2019   Procedure: COLONOSCOPY WITH PROPOFOL;  Surgeon: Virgel Manifold, MD;  Location: [facility]COPY;  Service: Endoscopy;  Laterality: N/A;   ESOPHAGOGASTRODUODENOSCOPY (EGD) WITH PROPOFOL N/A 04/24/2019   Procedure: ESOPHAGOGASTRODUODENOSCOPY (EGD) WITH PROPOFOL;  Surgeon: Virgel Manifold, MD;  Location: [facility];  Service: Endoscopy;  Laterality: N/A;   HAND SURGERY Right 05/07/2020   trigger release   UPPER GASTROINTESTINAL ENDOSCOPY      Family History  Problem Relation Age of Onset   Hypertension Mother    Colon cancer Neg Hx    Esophageal cancer Neg Hx    Stomach cancer Neg Hx    Rectal cancer Neg Hx     Allergies  Allergen Reactions   Asa Buff (Mag [Buffered Aspirin] Other (See Comments)    Stomach pain   Azithromycin Other (See Comments)    GI problems   Ibuprofen Other (See Comments)    GI upset   Penicillins Other (See Comments)     Unknown childhood allergic reaction Has patient had a PCN reaction causing immediate rash, facial/tongue/throat swelling, SOB or lightheadedness with hypotension: Unknown Has patient had a PCN reaction causing severe rash involving mucus membranes or skin necrosis: Unknown Has patient had a PCN reaction that required hospitalization: Unknown Has patient had a PCN reaction occurring within the last 10 years: No If all of the above answers are "NO", then may proceed with Cephalosporin use.    Current Outpatient Medications on File Prior to Visit  Medication Sig Dispense Refill   albuterol (VENTOLIN HFA) 108 (90 Base) MCG/ACT inhaler Inhale 1-2 puffs into the lungs every 6 (six) hours as needed for wheezing or shortness of breath. 18 g 0   escitalopram (LEXAPRO) 20 MG tablet TAKE 1 TABLET BY MOUTH ONCE DAILY FOR ANXIETY 90 tablet 3   ondansetron (ZOFRAN) 4 MG tablet Take 1 tablet (4 mg total) by mouth every 8 (eight) hours as needed for nausea or vomiting. 20 tablet 0   pantoprazole (PROTONIX) 40 MG tablet Take 1 tablet (40 mg total) by mouth 2 (two) times daily. 180 tablet 3   PHENobarbital (LUMINAL) 97.2 MG tablet Take 2 tablets (194.4 mg total) by mouth daily. 180 tablet 1   rizatriptan (MAXALT-MLT) 10 MG disintegrating tablet Take 1 tablet (10 mg total) by mouth as needed for migraine. May repeat in 2 hours if needed 9 tablet 5   Vitamin D, Ergocalciferol, (DRISDOL) 1.25 MG (50000 UNIT) CAPS capsule Take 1 capsule by mouth once weekly for 12 weeks. 12 capsule 0   famotidine (PEPCID) 20 MG tablet Take 1 tablet (20 mg total) by mouth 2 (two) times daily. If no relief from use of Pantoprazole (Patient not taking: Reported on 05/05/2021)     fluticasone furoate-vilanterol (BREO ELLIPTA) 200-25 MCG/INH AEPB Inhale 1 puff into the lungs daily. For asthma (Patient not taking: Reported on 05/05/2021) 28 each 0   No current facility-administered medications on file prior to visit.    BP 110/68    Pulse 66   Temp 98.2 F (36.8 C) (Temporal)   Ht '5\' 5"'$  (1.651 m)   Wt 173 lb (78.5 kg)   SpO2 98%   BMI 28.79 kg/m  Objective:   Physical Exam HENT:     Mouth/Throat:     Mouth: Mucous membranes are moist. No angioedema.     Dentition: No dental abscesses.     Palate: No mass.     Pharynx: Oropharynx is clear.  Neck:     Comments: Decrease in ROM with pain to right lateral neck.  Cardiovascular:     Rate and Rhythm: Normal rate and regular  rhythm.  Pulmonary:     Effort: Pulmonary effort is normal.     Breath sounds: Normal breath sounds.  Musculoskeletal:     Cervical back: Neck supple. Pain with movement present. No spinous process tenderness or muscular tenderness. Decreased range of motion.  Skin:    General: Skin is warm and dry.     Findings: Erythema present.     Comments: Moderate erythema to right cheek without extension to right eye or chin.  No vesicles or rash.  Warm to touch, non tender.           Assessment & Plan:      This visit occurred during the SARS-CoV-2 public health emergency.  Safety protocols were in place, including screening questions prior to the visit, additional usage of staff PPE, and extensive cleaning of exam room while observing appropriate contact time as indicated for disinfecting solutions.

## 2021-05-05 NOTE — Assessment & Plan Note (Signed)
To right side. Overall decent ROM, but does have pain with movement.  Rx for cyclobenzaprine 5 mg sent to pharmacy to take PRN. Discussed heat/ice, stretching.   Follow up next week.

## 2021-05-05 NOTE — Assessment & Plan Note (Signed)
Suspect symptoms today are representative of cellulitis, especially given warmth and rapid increase of symptoms.   No dental, eye, or oral involvement.  Does not represent herpes zoster or rosacea.   Treat with Bactrim DS BID x 7 days. Follow up early next week.

## 2021-05-05 NOTE — Assessment & Plan Note (Signed)
Low at last visit in June 2022. Compliant to weekly vitamin D 50,000 IU. Repeat vitamin D next visit.

## 2021-05-05 NOTE — Addendum Note (Signed)
Addended by: Francella Solian on: 05/05/2021 03:56 PM   Modules accepted: Orders

## 2021-05-05 NOTE — Patient Instructions (Addendum)
Start Bactrim DS (sulfamethoxazole/trimethoprim) tablets for infection. Take 1 tablet by mouth twice daily for 7 days.  You may take the cyclobenzaprine 5 mg muscle relaxer for neck pain. You can take 1 or 2 tablets every 8 hours. This may cause drowsiness.   Apply heat/ice to the neck. Try to stretch when possible.   Schedule a follow up visit with me for Tuesday next week.  It was a pleasure to see you today!

## 2021-05-05 NOTE — Assessment & Plan Note (Signed)
Very low during visit in June 2022. Has been receiving B12 injections since. Due for repeat injection today.  Will check labs at next visit.

## 2021-05-11 ENCOUNTER — Encounter: Payer: Self-pay | Admitting: Primary Care

## 2021-05-11 ENCOUNTER — Other Ambulatory Visit: Payer: Self-pay

## 2021-05-11 ENCOUNTER — Ambulatory Visit: Payer: BC Managed Care – PPO | Admitting: Primary Care

## 2021-05-11 DIAGNOSIS — M542 Cervicalgia: Secondary | ICD-10-CM

## 2021-05-11 DIAGNOSIS — L03211 Cellulitis of face: Secondary | ICD-10-CM

## 2021-05-11 NOTE — Assessment & Plan Note (Signed)
Resolved. Finish last day of Bactrim DS tablets.

## 2021-05-11 NOTE — Patient Instructions (Signed)
Finish your antibiotics.  We will repeat your labs in 2 weeks.  It was a pleasure to see you today!

## 2021-05-11 NOTE — Assessment & Plan Note (Signed)
Significantly improved and mostly resolved.

## 2021-05-11 NOTE — Progress Notes (Signed)
Subjective:    Patient ID: [name], female    DOB: [birth year], 50 y.o.   MRN: [id number]  HPI  [name] is a very pleasant 50 y.o. female with a history of migraines, asthma, epilepsy, GAD, hot flashes who presents today for follow up of cellulitis and neck pain.   She was last evaluated 05/05/21 for neck pain and right cheek swelling and erythema. During this visit we provided her with cyclobenzaprine for neck spasms and Bactrim DS tablets for right facial cellulitis. She was asked to come today for follow up.  Since her last visit she's noticed significant improvement in neck pain and range of motion. She's also noticed a reduction in right facial swelling and redness. She's compliant to her Bactrim antibiotics, no issues.   BP Readings from Last 3 Encounters:  05/11/21 120/82  05/05/21 110/68  04/29/21 112/72        Review of Systems  Musculoskeletal:  Negative for myalgias and neck pain.  Skin:  Negative for color change and wound.        Past Medical History:  Diagnosis Date   Allergy    Anxiety    Asthma    worse when younger   GERD (gastroesophageal reflux disease)    Migraine    1-2x/month   Persistent cough for 3 weeks or longer 10/09/2019   PONV (postoperative nausea and vomiting)    Seizure ([place])    Vertigo    no episodes for 7 yrs    Social History   Socioeconomic History   Marital status: Widowed    Spouse name: [name]   Number of children: 0   Years of education: 10th   Highest education level: Not on file  Occupational History    Employer: [id number]  Tobacco Use   Smoking status: Former    Packs/day: 1.00    Types: Cigarettes    Quit date: 08/28/2013    Years since quitting: 7.7   Smokeless tobacco: Never   Tobacco comments:    Quit Janurary 1st  Vaping Use   Vaping Use: Never used  Substance and Sexual Activity   Alcohol use: No    Alcohol/week: 0.0 standard drinks   Drug use: No   Sexual activity: Not  Currently    Birth control/protection: Surgical  Other Topics Concern   Not on file  Social History Narrative   Patient lives alone   No children.   Works at [employer].   Enjoys going to [employer], going to estate sells.   Right handed   Caffeine: 1 Dr. [name] every morning   Social Determinants of Health   Financial Resource Strain: Not on file  Food Insecurity: Not on file  Transportation Needs: Not on file  Physical Activity: Not on file  Stress: Not on file  Social Connections: Not on file  Intimate Partner Violence: Not on file    Past Surgical History:  Procedure Laterality Date   ABDOMINAL HYSTERECTOMY  2012   COLONOSCOPY     COLONOSCOPY WITH PROPOFOL N/A 04/24/2019   Procedure: COLONOSCOPY WITH PROPOFOL;  Surgeon: Virgel Manifold, MD;  Location: [facility];  Service: Endoscopy;  Laterality: N/A;   ESOPHAGOGASTRODUODENOSCOPY (EGD) WITH PROPOFOL N/A 04/24/2019   Procedure: ESOPHAGOGASTRODUODENOSCOPY (EGD) WITH PROPOFOL;  Surgeon: Virgel Manifold, MD;  Location: [facility];  Service: Endoscopy;  Laterality: N/A;   HAND SURGERY Right 05/07/2020   trigger release   UPPER GASTROINTESTINAL ENDOSCOPY      Family History  Problem Relation Age of Onset   Hypertension Mother    Colon cancer Neg Hx    Esophageal cancer Neg Hx    Stomach cancer Neg Hx    Rectal cancer Neg Hx     Allergies  Allergen Reactions   Asa Buff (Mag [Buffered Aspirin] Other (See Comments)    Stomach pain   Azithromycin Other (See Comments)    GI problems   Ibuprofen Other (See Comments)    GI upset   Penicillins Other (See Comments)    Unknown childhood allergic reaction Has patient had a PCN reaction causing immediate rash, facial/tongue/throat swelling, SOB or lightheadedness with hypotension: Unknown Has patient had a PCN reaction causing severe rash involving mucus membranes or skin necrosis: Unknown Has patient had a PCN reaction that required hospitalization:  Unknown Has patient had a PCN reaction occurring within the last 10 years: No If all of the above answers are "NO", then may proceed with Cephalosporin use.    Current Outpatient Medications on File Bowen to Visit  Medication Sig Dispense Refill   albuterol (VENTOLIN HFA) 108 (90 Base) MCG/ACT inhaler Inhale 1-2 puffs into the lungs every 6 (six) hours as needed for wheezing or shortness of breath. 18 g 0   cyclobenzaprine (FLEXERIL) 5 MG tablet Take 1-2 tablets (5-10 mg total) by mouth 3 (three) times daily as needed for muscle spasms. 30 tablet 0   escitalopram (LEXAPRO) 20 MG tablet TAKE 1 TABLET BY MOUTH ONCE DAILY FOR ANXIETY 90 tablet 3   famotidine (PEPCID) 20 MG tablet Take 20 mg by mouth 2 (two) times daily. If no relief from use of Pantoprazole     fluticasone furoate-vilanterol (BREO ELLIPTA) 200-25 MCG/INH AEPB Inhale 1 puff into the lungs daily. For asthma 28 each 0   ondansetron (ZOFRAN) 4 MG tablet Take 1 tablet (4 mg total) by mouth every 8 (eight) hours as needed for nausea or vomiting. 20 tablet 0   pantoprazole (PROTONIX) 40 MG tablet Take 1 tablet (40 mg total) by mouth 2 (two) times daily. 180 tablet 3   PHENobarbital (LUMINAL) 97.2 MG tablet Take 2 tablets (194.4 mg total) by mouth daily. 180 tablet 1   rizatriptan (MAXALT-MLT) 10 MG disintegrating tablet Take 1 tablet (10 mg total) by mouth as needed for migraine. May repeat in 2 hours if needed 9 tablet 5   Vitamin D, Ergocalciferol, (DRISDOL) 1.25 MG (50000 UNIT) CAPS capsule Take 1 capsule by mouth once weekly for 12 weeks. 12 capsule 0   No current facility-administered medications on file Bowen to visit.    BP 120/82   Pulse 69   Temp 98.6 F (37 C) (Temporal)   Ht '5\' 5"'$  (1.651 m)   Wt 173 lb (78.5 kg)   SpO2 99%   BMI 28.79 kg/m  Objective:   Physical Exam Cardiovascular:     Rate and Rhythm: Normal rate and regular rhythm.  Pulmonary:     Effort: Pulmonary effort is normal.     Breath sounds: Normal  breath sounds.  Musculoskeletal:     Cervical back: Full passive range of motion without pain and neck supple.  Skin:    General: Skin is warm and dry.     Findings: No erythema.     Comments: Right cheek cellulitis significantly improved and appears resolved.           Assessment & Plan:      This visit occurred during the SARS-CoV-2 public health emergency.  Safety protocols were  in place, including screening questions Bowen to the visit, additional usage of staff PPE, and extensive cleaning of exam room while observing appropriate contact time as indicated for disinfecting solutions.

## 2021-05-13 ENCOUNTER — Encounter: Payer: Self-pay | Admitting: Primary Care

## 2021-05-13 ENCOUNTER — Telehealth: Payer: Self-pay | Admitting: Primary Care

## 2021-05-13 NOTE — Telephone Encounter (Signed)
Pt wants to know if she can have her nurse visit at [facility]

## 2021-05-16 NOTE — Telephone Encounter (Signed)
Spoke with pt informing her all Nvs are done at the [facility] location at this time.  Pt verbalizes understanding.  R/s scheduled vit B12 shot on 06/07/21 at 2:30.

## 2021-05-19 ENCOUNTER — Other Ambulatory Visit: Payer: BC Managed Care – PPO

## 2021-05-24 ENCOUNTER — Institutional Professional Consult (permissible substitution): Payer: BC Managed Care – PPO | Admitting: Neurology

## 2021-05-25 ENCOUNTER — Other Ambulatory Visit: Payer: Self-pay

## 2021-05-25 ENCOUNTER — Ambulatory Visit
Admission: RE | Admit: 2021-05-25 | Discharge: 2021-05-25 | Disposition: A | Discharge status: Home / Self Care | Payer: BC Managed Care – PPO | Source: Ambulatory Visit | Attending: Primary Care | Admitting: Primary Care

## 2021-05-25 DIAGNOSIS — Z1231 Encounter for screening mammogram for malignant neoplasm of breast: Secondary | ICD-10-CM

## 2021-05-26 ENCOUNTER — Ambulatory Visit: Payer: BC Managed Care – PPO

## 2021-05-26 ENCOUNTER — Other Ambulatory Visit (INDEPENDENT_AMBULATORY_CARE_PROVIDER_SITE_OTHER): Payer: BC Managed Care – PPO

## 2021-05-26 DIAGNOSIS — E559 Vitamin D deficiency, unspecified: Secondary | ICD-10-CM | POA: Diagnosis not present

## 2021-05-26 DIAGNOSIS — E538 Deficiency of other specified B group vitamins: Secondary | ICD-10-CM | POA: Diagnosis not present

## 2021-05-26 LAB — VITAMIN B12: Vitamin B-12: 255 pg/mL (ref 211–911)

## 2021-05-26 LAB — VITAMIN D 25 HYDROXY (VIT D DEFICIENCY, FRACTURES): VITD: 16.91 ng/mL — ABNORMAL LOW (ref 30.00–100.00)

## 2021-05-31 ENCOUNTER — Ambulatory Visit: Payer: BC Managed Care – PPO

## 2021-06-07 ENCOUNTER — Other Ambulatory Visit: Payer: Self-pay

## 2021-06-07 ENCOUNTER — Ambulatory Visit (INDEPENDENT_AMBULATORY_CARE_PROVIDER_SITE_OTHER): Payer: BC Managed Care – PPO

## 2021-06-07 ENCOUNTER — Telehealth: Payer: Self-pay | Admitting: Primary Care

## 2021-06-07 DIAGNOSIS — E538 Deficiency of other specified B group vitamins: Secondary | ICD-10-CM

## 2021-06-07 MED ORDER — CYANOCOBALAMIN 1000 MCG/ML IJ SOLN
1000.0000 ug | Freq: Once | INTRAMUSCULAR | Status: AC
  Administered 2021-06-07: 1000 ug via INTRAMUSCULAR

## 2021-06-07 NOTE — Progress Notes (Signed)
After discussion with Allie Bossier, NP about recent B12 labs, pt wants to continue injection of monthly B12 1000 mcg/ml instead of oral daily treatment. B12 injection given by Pilar Grammes, CMA in Right [place].  Patient tolerated injection well.  She will schedule more appointments on her way out.

## 2021-06-07 NOTE — Telephone Encounter (Signed)
Received message while reviewing chart looks like patient was given b-12 today at [place] office.

## 2021-06-07 NOTE — Telephone Encounter (Signed)
[name] called in and stated that she received a message asking if she wanted to do the b-12 inj or the pill. And she wants to do the injections

## 2021-06-07 NOTE — Telephone Encounter (Signed)
Noted, this was taken care of.

## 2021-06-10 ENCOUNTER — Ambulatory Visit: Payer: BC Managed Care – PPO | Admitting: Podiatry

## 2021-06-12 ENCOUNTER — Other Ambulatory Visit: Payer: Self-pay | Admitting: Primary Care

## 2021-06-12 DIAGNOSIS — J4520 Mild intermittent asthma, uncomplicated: Secondary | ICD-10-CM

## 2021-06-14 ENCOUNTER — Other Ambulatory Visit: Payer: Self-pay | Admitting: Primary Care

## 2021-06-14 DIAGNOSIS — E559 Vitamin D deficiency, unspecified: Secondary | ICD-10-CM

## 2021-06-15 ENCOUNTER — Encounter: Payer: Self-pay | Admitting: Primary Care

## 2021-06-15 ENCOUNTER — Other Ambulatory Visit: Payer: Self-pay

## 2021-06-15 ENCOUNTER — Ambulatory Visit: Payer: BC Managed Care – PPO | Admitting: Primary Care

## 2021-06-15 VITALS — BP 120/82 | HR 74 | Temp 98.6°F | Ht 65.0 in | Wt 179.0 lb

## 2021-06-15 DIAGNOSIS — J4521 Mild intermittent asthma with (acute) exacerbation: Secondary | ICD-10-CM

## 2021-06-15 MED ORDER — PREDNISONE 20 MG PO TABS: ORAL_TABLET | ORAL | 0 refills | Status: DC

## 2021-06-15 MED ORDER — FLUTICASONE FUROATE-VILANTEROL 200-25 MCG/ACT IN AEPB: 1.0000 | INHALATION_SPRAY | Freq: Every day | RESPIRATORY_TRACT | 2 refills | Status: DC

## 2021-06-15 NOTE — Progress Notes (Signed)
Subjective:    Patient ID: [name], female    DOB: 27-Sep-1970, 50 y.o.   MRN: [id number]  HPI  [name] is a very pleasant 50 y.o. female with a history of migraines, asthma, esophagitis, GAD, seizure disorder who presents today to discuss shortness of breath.  Shortness of breath occurred three days ago while watching TV. She reached for her albuterol inhaler and realized it was empty. Also with dry cough that began a few days prior. Since then her SOB has been noticeable when working, has an active job at [employer], notices SOB with recurrent bending.   Currently prescribed Breo Ellipta 200-25 mcg daily and albuterol inhaler PRN. No recent use of [facility], never refilled after this was prescribed in June 2022. She denies asthma issues in months.    Review of Systems  Constitutional:  Negative for fever.  HENT:  Negative for congestion.   Respiratory:  Positive for cough and shortness of breath. Negative for chest tightness and wheezing.         Past Medical History:  Diagnosis Date   Allergy    Anxiety    Asthma    worse when younger   GERD (gastroesophageal reflux disease)    Migraine    1-2x/month   Persistent cough for 3 weeks or longer 10/09/2019   PONV (postoperative nausea and vomiting)    Seizure ([place])    Vertigo    no episodes for 7 yrs    Social History   Socioeconomic History   Marital status: Widowed    Spouse name: [name]   Number of children: 0   Years of education: 10th   Highest education level: Not on file  Occupational History    Employer: [id number]  Tobacco Use   Smoking status: Former    Packs/day: 1.00    Types: Cigarettes    Quit date: 08/28/2013    Years since quitting: 7.8   Smokeless tobacco: Never   Tobacco comments:    Quit Janurary 1st  Vaping Use   Vaping Use: Never used  Substance and Sexual Activity   Alcohol use: No    Alcohol/week: 0.0 standard drinks   Drug use: No   Sexual activity: Not  Currently    Birth control/protection: Surgical  Other Topics Concern   Not on file  Social History Narrative   Patient lives alone   No children.   Works at [employer].   Enjoys going to [employer], going to estate sells.   Right handed   Caffeine: 1 Dr. [name] every morning   Social Determinants of Health   Financial Resource Strain: Not on file  Food Insecurity: Not on file  Transportation Needs: Not on file  Physical Activity: Not on file  Stress: Not on file  Social Connections: Not on file  Intimate Partner Violence: Not on file    Past Surgical History:  Procedure Laterality Date   ABDOMINAL HYSTERECTOMY  2012   COLONOSCOPY     COLONOSCOPY WITH PROPOFOL N/A 04/24/2019   Procedure: COLONOSCOPY WITH PROPOFOL;  Surgeon: Virgel Manifold, MD;  Location: [facility] END[facility];  Service: Endoscopy;  Laterality: N/A;   ESOPHAGOGASTRODUODENOSCOPY (EGD) WITH PROPOFOL N/A 04/24/2019   Procedure: ESOPHAGOGASTRODUODENOSCOPY (EGD) WITH PROPOFOL;  Surgeon: Virgel Manifold, MD;  Location: [facility] END[facility];  Service: Endoscopy;  Laterality: N/A;   HAND SURGERY Right 05/07/2020   trigger release   UPPER GASTROINTESTINAL ENDOSCOPY      Family History  Problem Relation Age of Onset  Hypertension Mother    Colon cancer Neg Hx    Esophageal cancer Neg Hx    Stomach cancer Neg Hx    Rectal cancer Neg Hx     Allergies  Allergen Reactions   Asa Buff (Mag [Buffered Aspirin] Other (See Comments)    Stomach pain   Azithromycin Other (See Comments)    GI problems   Ibuprofen Other (See Comments)    GI upset   Penicillins Other (See Comments)    Unknown childhood allergic reaction Has patient had a PCN reaction causing immediate rash, facial/tongue/throat swelling, SOB or lightheadedness with hypotension: Unknown Has patient had a PCN reaction causing severe rash involving mucus membranes or skin necrosis: Unknown Has patient had a PCN reaction that required hospitalization:  Unknown Has patient had a PCN reaction occurring within the last 10 years: No If all of the above answers are "NO", then may proceed with Cephalosporin use.    Current Outpatient Medications on File Prior to Visit  Medication Sig Dispense Refill   escitalopram (LEXAPRO) 20 MG tablet TAKE 1 TABLET BY MOUTH ONCE DAILY FOR ANXIETY 90 tablet 3   famotidine (PEPCID) 20 MG tablet Take 20 mg by mouth 2 (two) times daily. If no relief from use of Pantoprazole     ondansetron (ZOFRAN) 4 MG tablet Take 1 tablet (4 mg total) by mouth every 8 (eight) hours as needed for nausea or vomiting. 20 tablet 0   PHENobarbital (LUMINAL) 97.2 MG tablet Take 2 tablets (194.4 mg total) by mouth daily. 180 tablet 1   rizatriptan (MAXALT-MLT) 10 MG disintegrating tablet Take 1 tablet (10 mg total) by mouth as needed for migraine. May repeat in 2 hours if needed 9 tablet 5   VENTOLIN HFA 108 (90 Base) MCG/ACT inhaler INHALE 1 TO 2 PUFFS INTO LUNGS EVERY 6 HOURS AS NEEDED FOR WHEEZING FOR SHORTNESS OF BREATH 18 g 0   Vitamin D, Ergocalciferol, (DRISDOL) 1.25 MG (50000 UNIT) CAPS capsule TAKE 1 CAPSULE BY MOUTH ONCE WEEKLY 12 capsule 0   cyclobenzaprine (FLEXERIL) 5 MG tablet Take 1-2 tablets (5-10 mg total) by mouth 3 (three) times daily as needed for muscle spasms. (Patient not taking: Reported on 06/15/2021) 30 tablet 0   fluticasone furoate-vilanterol (BREO ELLIPTA) 200-25 MCG/INH AEPB Inhale 1 puff into the lungs daily. For asthma (Patient not taking: Reported on 06/15/2021) 28 each 0   No current facility-administered medications on file prior to visit.    BP 120/82   Pulse 74   Temp 98.6 F (37 C) (Temporal)   Ht 5\' 5"  (1.651 m)   Wt 179 lb (81.2 kg)   SpO2 98%   BMI 29.79 kg/m  Objective:   Physical Exam Cardiovascular:     Rate and Rhythm: Normal rate and regular rhythm.  Pulmonary:     Effort: Pulmonary effort is normal.     Breath sounds: Normal breath sounds.     Comments: Increased effort during  deep breathing, had to take a break during evaluation.  Musculoskeletal:     Cervical back: Neck supple.  Skin:    General: Skin is warm and dry.          Assessment & Plan:      This visit occurred during the SARS-CoV-2 public health emergency.  Safety protocols were in place, including screening questions prior to the visit, additional usage of staff PPE, and extensive cleaning of exam room while observing appropriate contact time as indicated for disinfecting solutions.

## 2021-06-15 NOTE — Assessment & Plan Note (Addendum)
Uncontrolled today with acute exacerbation.  Exam notable for dyspnea with repeated inspiration.   Refill Breo 200-25 mcg and continue Ventolin 108 mcg Patient advised to use Breo daily, educated to rinse and spit after each use. Educated to call if needing to use rescue inhaler more than 3 times per week.  Start oral Prednisone regimen, 40 mg daily for 5 days.  I evaluated patient, was consulted regarding treatment, and agree with assessment and plan per Budd Palmer, RN, DNP student.   Allie Bossier, NP-C

## 2021-06-15 NOTE — Patient Instructions (Addendum)
It was great to see you today! We are refilling your Breo today. Your rescue inhaler, Ventolin has been filled already.  Always rinse and spit after use of Breo inhaler as it contains a steroid.  We have also sent in a short course of oral steroids, take 2 tablets once daily by mouth for 5 days.

## 2021-06-15 NOTE — Progress Notes (Signed)
Acute Office Visit  Subjective:    Patient ID: [name], female    DOB: 16-Jan-1971, 50 y.o.   MRN: [id number]  Chief Complaint  Patient presents with   Shortness of Breath    X 3 days denies any tightness in her chest or wheezing.     Shortness of Breath Pertinent negatives include no chest pain or wheezing.  [name] is a 50 year old female with a past medical history of asthma presents today for 3 days of shortness of breath. She was started on Breo in June 2022. She has not taken it in months, she only filled the prescription once.  She was watching a movie on Saturday, and she developed difficulty breathing, and realized she was out of medication. She denies tightness in her chest which is typically how she presents with asthma flare. She has had a dry cough since Sunday. Has not had any exposure to anyone with COVID or other URI, nor has she had any other URI-like symptoms. Bending over and ambulation make the SOB worse, making her stop to take a break before resuming activity. The SOB is not worse at night, not sleep disruptive. She cannot identify any asthma triggers.   She has associated a mild sharp pain to her left flank with inspiration and expiration, no precipitating traumatic event.   She has also not taken her Ventolin in several months. Prior to running out of this, she was very sporatically taking this medication, so little that she cannot recall the last time she used it.   Past Medical History:  Diagnosis Date   Allergy    Anxiety    Asthma    worse when younger   GERD (gastroesophageal reflux disease)    Migraine    1-2x/month   Persistent cough for 3 weeks or longer 10/09/2019   PONV (postoperative nausea and vomiting)    Seizure ([place])    Vertigo    no episodes for 7 yrs    Past Surgical History:  Procedure Laterality Date   ABDOMINAL HYSTERECTOMY  2012   COLONOSCOPY     COLONOSCOPY WITH PROPOFOL N/A 04/24/2019   Procedure:  COLONOSCOPY WITH PROPOFOL;  Surgeon: Virgel Manifold, MD;  Location: [facility] ENDOSCOPY;  Service: Endoscopy;  Laterality: N/A;   ESOPHAGOGASTRODUODENOSCOPY (EGD) WITH PROPOFOL N/A 04/24/2019   Procedure: ESOPHAGOGASTRODUODENOSCOPY (EGD) WITH PROPOFOL;  Surgeon: Virgel Manifold, MD;  Location: [facility] ENDOSCOPY;  Service: Endoscopy;  Laterality: N/A;   HAND SURGERY Right 05/07/2020   trigger release   UPPER GASTROINTESTINAL ENDOSCOPY      Family History  Problem Relation Age of Onset   Hypertension Mother    Colon cancer Neg Hx    Esophageal cancer Neg Hx    Stomach cancer Neg Hx    Rectal cancer Neg Hx     Social History   Socioeconomic History   Marital status: Widowed    Spouse name: [name]   Number of children: 0   Years of education: 10th   Highest education level: Not on file  Occupational History    Employer: [id number]  Tobacco Use   Smoking status: Former    Packs/day: 1.00    Types: Cigarettes    Quit date: 08/28/2013    Years since quitting: 7.8   Smokeless tobacco: Never   Tobacco comments:    Quit Janurary 1st  Vaping Use   Vaping Use: Never used  Substance and Sexual Activity   Alcohol use: No  Alcohol/week: 0.0 standard drinks   Drug use: No   Sexual activity: Not Currently    Birth control/protection: Surgical  Other Topics Concern   Not on file  Social History Narrative   Patient lives alone   No children.   Works at [employer].   Enjoys going to [employer], going to estate sells.   Right handed   Caffeine: 1 Dr. [name] every morning   Social Determinants of Health   Financial Resource Strain: Not on file  Food Insecurity: Not on file  Transportation Needs: Not on file  Physical Activity: Not on file  Stress: Not on file  Social Connections: Not on file  Intimate Partner Violence: Not on file    Outpatient Medications Prior to Visit  Medication Sig Dispense Refill   escitalopram (LEXAPRO) 20 MG tablet TAKE 1 TABLET BY MOUTH ONCE DAILY FOR  ANXIETY 90 tablet 3   famotidine (PEPCID) 20 MG tablet Take 20 mg by mouth 2 (two) times daily. If no relief from use of Pantoprazole     ondansetron (ZOFRAN) 4 MG tablet Take 1 tablet (4 mg total) by mouth every 8 (eight) hours as needed for nausea or vomiting. 20 tablet 0   PHENobarbital (LUMINAL) 97.2 MG tablet Take 2 tablets (194.4 mg total) by mouth daily. 180 tablet 1   rizatriptan (MAXALT-MLT) 10 MG disintegrating tablet Take 1 tablet (10 mg total) by mouth as needed for migraine. May repeat in 2 hours if needed 9 tablet 5   VENTOLIN HFA 108 (90 Base) MCG/ACT inhaler INHALE 1 TO 2 PUFFS INTO LUNGS EVERY 6 HOURS AS NEEDED FOR WHEEZING FOR SHORTNESS OF BREATH 18 g 0   Vitamin D, Ergocalciferol, (DRISDOL) 1.25 MG (50000 UNIT) CAPS capsule TAKE 1 CAPSULE BY MOUTH ONCE WEEKLY 12 capsule 0   cyclobenzaprine (FLEXERIL) 5 MG tablet Take 1-2 tablets (5-10 mg total) by mouth 3 (three) times daily as needed for muscle spasms. (Patient not taking: Reported on 06/15/2021) 30 tablet 0   fluticasone furoate-vilanterol (BREO ELLIPTA) 200-25 MCG/INH AEPB Inhale 1 puff into the lungs daily. For asthma (Patient not taking: Reported on 06/15/2021) 28 each 0   pantoprazole (PROTONIX) 40 MG tablet Take 1 tablet (40 mg total) by mouth 2 (two) times daily. 180 tablet 3   No facility-administered medications prior to visit.    Allergies  Allergen Reactions   Asa Buff (Mag [Buffered Aspirin] Other (See Comments)    Stomach pain   Azithromycin Other (See Comments)    GI problems   Ibuprofen Other (See Comments)    GI upset   Penicillins Other (See Comments)    Unknown childhood allergic reaction Has patient had a PCN reaction causing immediate rash, facial/tongue/throat swelling, SOB or lightheadedness with hypotension: Unknown Has patient had a PCN reaction causing severe rash involving mucus membranes or skin necrosis: Unknown Has patient had a PCN reaction that required hospitalization: Unknown Has patient  had a PCN reaction occurring within the last 10 years: No If all of the above answers are "NO", then may proceed with Cephalosporin use.    Review of Systems  HENT: Negative.    Respiratory:  Positive for shortness of breath. Negative for chest tightness and wheezing.   Cardiovascular:  Negative for chest pain.  Skin: Negative.       Objective:    Physical Exam Constitutional:      Appearance: She is well-developed.  Cardiovascular:     Rate and Rhythm: Normal rate and regular rhythm.  Pulmonary:  Effort: No respiratory distress.     Breath sounds: Normal breath sounds. No wheezing.     Comments: Dyspnea with repeated inspiration Chest:     Chest wall: No tenderness.  Skin:    General: Skin is warm and dry.  Neurological:     General: No focal deficit present.     Mental Status: She is alert and oriented to person, place, and time.    BP 120/82   Pulse 74   Temp 98.6 F (37 C) (Temporal)   Ht 5\' 5"  (1.651 m)   Wt 179 lb (81.2 kg)   SpO2 98%   BMI 29.79 kg/m  Wt Readings from Last 3 Encounters:  06/15/21 179 lb (81.2 kg)  05/11/21 173 lb (78.5 kg)  05/05/21 173 lb (78.5 kg)    There are no preventive care reminders to display for this patient.  There are no preventive care reminders to display for this patient.   Lab Results  Component Value Date   TSH 1.44 02/18/2021   Lab Results  Component Value Date   WBC 7.8 02/18/2021   HGB 14.1 02/18/2021   HCT 40.9 02/18/2021   MCV 93.7 02/18/2021   PLT 210.0 02/18/2021   Lab Results  Component Value Date   NA 138 10/27/2020   K 3.7 10/27/2020   CO2 30 10/27/2020   GLUCOSE 92 10/27/2020   BUN 9 10/27/2020   CREATININE 0.76 10/27/2020   BILITOT 0.3 10/27/2020   ALKPHOS 89 10/27/2020   AST 15 10/27/2020   ALT 16 10/27/2020   PROT 7.2 10/27/2020   ALBUMIN 4.1 10/27/2020   CALCIUM 9.2 10/27/2020   ANIONGAP 13 01/08/2020   GFR 90.78 10/27/2020   Lab Results  Component Value Date   CHOL 233 (H)  10/27/2020   Lab Results  Component Value Date   HDL 48.20 10/27/2020   Lab Results  Component Value Date   LDLCALC 117 (H) 11/30/2017   Lab Results  Component Value Date   TRIG 229.0 (H) 10/27/2020   Lab Results  Component Value Date   CHOLHDL 5 10/27/2020   Lab Results  Component Value Date   HGBA1C 5.4 08/13/2019       Assessment & Plan:   Problem List Items Addressed This Visit   None    No orders of the defined types were placed in this encounter.    Ninfa Meeker, RN

## 2021-06-16 ENCOUNTER — Telehealth: Payer: Self-pay

## 2021-06-16 DIAGNOSIS — J4520 Mild intermittent asthma, uncomplicated: Secondary | ICD-10-CM

## 2021-06-16 MED ORDER — ALBUTEROL SULFATE HFA 108 (90 BASE) MCG/ACT IN AERS: 2.0000 | INHALATION_SPRAY | Freq: Four times a day (QID) | RESPIRATORY_TRACT | 0 refills | Status: DC | PRN

## 2021-06-16 NOTE — Telephone Encounter (Signed)
Fax from pharmacy requesting to resend so generic can be dispensed.

## 2021-06-16 NOTE — Addendum Note (Signed)
Addended by: Pleas Koch on: 06/16/2021 03:03 PM   Modules accepted: Orders

## 2021-06-16 NOTE — Telephone Encounter (Signed)
Rx recent to pharmacy.

## 2021-06-17 ENCOUNTER — Encounter: Payer: Self-pay | Admitting: Gastroenterology

## 2021-07-13 ENCOUNTER — Ambulatory Visit (INDEPENDENT_AMBULATORY_CARE_PROVIDER_SITE_OTHER): Payer: BC Managed Care – PPO

## 2021-07-13 ENCOUNTER — Other Ambulatory Visit: Payer: Self-pay

## 2021-07-13 DIAGNOSIS — E538 Deficiency of other specified B group vitamins: Secondary | ICD-10-CM

## 2021-07-13 MED ORDER — CYANOCOBALAMIN 1000 MCG/ML IJ SOLN
1000.0000 ug | Freq: Once | INTRAMUSCULAR | Status: AC
  Administered 2021-07-13: 1000 ug via INTRAMUSCULAR

## 2021-07-13 NOTE — Progress Notes (Signed)
Per orders of Alma Friendly NP, an injection of B12 was given by Ophelia Shoulder, CMA.  Patient tolerated injection well.

## 2021-08-02 ENCOUNTER — Other Ambulatory Visit: Payer: Self-pay | Admitting: Primary Care

## 2021-08-02 DIAGNOSIS — E538 Deficiency of other specified B group vitamins: Secondary | ICD-10-CM

## 2021-08-02 DIAGNOSIS — E559 Vitamin D deficiency, unspecified: Secondary | ICD-10-CM

## 2021-08-02 NOTE — Telephone Encounter (Signed)
Patient will need lab only appointment in 1 month to repeat vitamin D and B12.  Please have her schedule.

## 2021-08-03 NOTE — Telephone Encounter (Signed)
Called patient lab appointment made. No further questions.

## 2021-08-04 ENCOUNTER — Ambulatory Visit: Payer: BC Managed Care – PPO | Admitting: Neurology

## 2021-08-04 ENCOUNTER — Encounter: Payer: Self-pay | Admitting: Neurology

## 2021-08-04 VITALS — BP 129/77 | HR 67 | Ht 65.0 in | Wt 179.8 lb

## 2021-08-04 DIAGNOSIS — R635 Abnormal weight gain: Secondary | ICD-10-CM | POA: Diagnosis not present

## 2021-08-04 DIAGNOSIS — R351 Nocturia: Secondary | ICD-10-CM

## 2021-08-04 DIAGNOSIS — R519 Headache, unspecified: Secondary | ICD-10-CM

## 2021-08-04 DIAGNOSIS — G4719 Other hypersomnia: Secondary | ICD-10-CM

## 2021-08-04 DIAGNOSIS — R0683 Snoring: Secondary | ICD-10-CM | POA: Diagnosis not present

## 2021-08-04 NOTE — Progress Notes (Signed)
Subjective:    Patient ID: [name] is a 50 y.o. female.  HPI    [name], MD, PhD [facility] [address], [address]O. [place], [place] [address]  Dear [name] and [name],    I saw your patient, [name], upon your kind request in my clinic today for initial consultation of her sleep disorder, in particular, concern for underlying obstructive sleep apnea. The patient is unaccompanied today. As you know, [name] is a 50 year old right-handed woman with an underlying medical history of migraines, seizure disorder, allergies, anxiety, asthma, vitamin D deficiency, vitamin B12 deficiency, vertigo, and overweight state, who reports snoring and excessive daytime somnolence as well as morning headaches.  I reviewed your office note from 03/14/2021.  Her Epworth sleepiness score is 14 out of 24, fatigue severity score is 47 out of 63.  I had evaluated her several years ago for sleep disturbance and suggested a sleep study, she did not pursue sleep testing at the time.  She reports weight gain of over 20 pounds.  Sadly, she lost her husband in 2020 to cancer.  She lives alone, no pets in the house, she has no children.  She does have a TV in the bedroom and sometimes watches it at night but does not leave it on all night.  Bedtime is generally between 6 and 7 PM as she has to get up early but she often does not get to go to bed that early.  Rise time is between 2:30 AM and 3 AM.  She works at 1 PM daily from Sunday through Thursday.  She is in the middle of any family history of sleep apnea.  She denies any nocturnal palpitations but has woken up with a headache.  She has nocturia about once per average night.  She quit smoking some 6 or 7 years ago, drinks alcohol rarely, does like to drink Dr. [name], regular, about 5 or 6 servings per day, cans or bottles.  She was diagnosed with low vitamin D and low vitamin B12 and has been on prescription vitamin D and  B12 injections.     Previously:   10/26/15: 50 year old right-handed woman with an underlying medical history of migraines, and juvenile myoclonic epilepsy, who reports snoring and excessive daytime somnolence, sleep disruption and nonrestorative sleep. I reviewed your office note from 10/22/2015. She has been on phenobarbital for years. She was recently started on amitriptyline for headaches. She takes 25 mg each night and it has helped some.  She stopped smoking in January 2016, since then, she states, she has gained about 30 lb.  Snoring can be allowed per husband. She denies morning headaches or nocturia. She denies restless leg symptoms but is a restless sleeper. Her Epworth sleepiness score is 16 out of 24 today, her fatigue score is 48 out of 63. She works as a Teacher, English as a foreign language at [employer]. Her usual work schedule is 7 to 4 and sometimes on weekends she has to work from 4 AM to 1 PM. Bedtime is usually between 9 and 10 PM and she does not typically get to bed earlier when she has to be up early for weekend shifts. Rise time typically is 5 to 5:30 AM. On weekends sometimes she has to be up at 2:30 AM to be at work at 4 AM. She denies a family history of OSA. She has multiple middle of the night awakenings, more than 3 times on an average night. She has some  trouble going back to sleep. She does not drink alcohol or use illicit drugs. She drinks one can of soda per day. She lives at home with her husband. They have no pets, no children.  Her Past Medical History Is Significant For: Past Medical History:  Diagnosis Date   Allergy    Anxiety    Asthma    worse when younger   GERD (gastroesophageal reflux disease)    Migraine    1-2x/month   Persistent cough for 3 weeks or longer 10/09/2019   PONV (postoperative nausea and vomiting)    Seizure ([place])    Vertigo    no episodes for 7 yrs    Her Past Surgical History Is Significant For: Past Surgical History:  Procedure Laterality Date    ABDOMINAL HYSTERECTOMY  2012   COLONOSCOPY     COLONOSCOPY WITH PROPOFOL N/A 04/24/2019   Procedure: COLONOSCOPY WITH PROPOFOL;  Surgeon: Virgel Manifold, MD;  Location: [facility] ENDOSCOPY;  Service: Endoscopy;  Laterality: N/A;   ESOPHAGOGASTRODUODENOSCOPY (EGD) WITH PROPOFOL N/A 04/24/2019   Procedure: ESOPHAGOGASTRODUODENOSCOPY (EGD) WITH PROPOFOL;  Surgeon: Virgel Manifold, MD;  Location: [facility] ENDOSCOPY;  Service: Endoscopy;  Laterality: N/A;   HAND SURGERY Right 05/07/2020   trigger release   UPPER GASTROINTESTINAL ENDOSCOPY      Her Family History Is Significant For: Family History  Problem Relation Age of Onset   Hypertension Mother    Colon cancer Neg Hx    Esophageal cancer Neg Hx    Stomach cancer Neg Hx    Rectal cancer Neg Hx     Her Social History Is Significant For: Social History   Socioeconomic History   Marital status: Widowed    Spouse name: [name]   Number of children: 0   Years of education: 10th   Highest education level: Not on file  Occupational History    Employer: [id number]  Tobacco Use   Smoking status: Former    Packs/day: 1.00    Types: Cigarettes    Quit date: 08/28/2013    Years since quitting: 7.9   Smokeless tobacco: Never   Tobacco comments:    Quit Janurary 1st  Vaping Use   Vaping Use: Never used  Substance and Sexual Activity   Alcohol use: No    Alcohol/week: 0.0 standard drinks   Drug use: No   Sexual activity: Not Currently    Birth control/protection: Surgical  Other Topics Concern   Not on file  Social History Narrative   Patient lives alone   No children.   Works at [employer].   Enjoys going to [employer], going to estate sells.   Right handed   Caffeine: 1 Dr. [name] all day long (5-6)   Social Determinants of Health   Financial Resource Strain: Not on file  Food Insecurity: Not on file  Transportation Needs: Not on file  Physical Activity: Not on file  Stress: Not on file  Social Connections: Not on file     Her Allergies Are:  Allergies  Allergen Reactions   Asa Buff (Mag [Buffered Aspirin] Other (See Comments)    Stomach pain   Azithromycin Other (See Comments)    GI problems   Ibuprofen Other (See Comments)    GI upset   Penicillins Other (See Comments)    Unknown childhood allergic reaction Has patient had a PCN reaction causing immediate rash, facial/tongue/throat swelling, SOB or lightheadedness with hypotension: Unknown Has patient had a PCN reaction causing severe rash involving mucus membranes or  skin necrosis: Unknown Has patient had a PCN reaction that required hospitalization: Unknown Has patient had a PCN reaction occurring within the last 10 years: No If all of the above answers are "NO", then may proceed with Cephalosporin use.  :   Her Current Medications Are:  Outpatient Encounter Medications as of 08/04/2021  Medication Sig   albuterol (VENTOLIN HFA) 108 (90 Base) MCG/ACT inhaler Inhale 2 puffs into the lungs every 6 (six) hours as needed for wheezing or shortness of breath.   escitalopram (LEXAPRO) 20 MG tablet TAKE 1 TABLET BY MOUTH ONCE DAILY FOR ANXIETY   famotidine (PEPCID) 20 MG tablet Take 20 mg by mouth 2 (two) times daily. If no relief from use of Pantoprazole   fluticasone furoate-vilanterol (BREO ELLIPTA) 200-25 MCG/ACT AEPB Inhale 1 puff into the lungs daily. For asthma, rinse mouth after each use   ondansetron (ZOFRAN) 4 MG tablet Take 1 tablet (4 mg total) by mouth every 8 (eight) hours as needed for nausea or vomiting.   PHENobarbital (LUMINAL) 97.2 MG tablet Take 2 tablets (194.4 mg total) by mouth daily.   predniSONE (DELTASONE) 20 MG tablet Take 2 tablets by mouth once daily for 5 days for asthma flare   rizatriptan (MAXALT-MLT) 10 MG disintegrating tablet Take 1 tablet (10 mg total) by mouth as needed for migraine. May repeat in 2 hours if needed   Vitamin D, Ergocalciferol, (DRISDOL) 1.25 MG (50000 UNIT) CAPS capsule Take 1 capsule by mouth once a  week   No facility-administered encounter medications on file as of 08/04/2021.  :   Review of Systems:  Out of a complete 14 point review of systems, all are reviewed and negative with the exception of these symptoms as listed below:   Review of Systems  Neurological:        Hard time waking up.  Daytime somnolence. Tired all the time.  Not restful sleeping for several months.  ESS 14, FSS 55.   Objective:  Neurological Exam  Physical Exam Physical Examination:   Vitals:   08/04/21 1518  BP: 129/77  Pulse: 67    General Examination: The patient is a very pleasant 50 y.o. female in no acute distress. She appears well-developed and well-nourished and well groomed.   HEENT: Normocephalic, atraumatic, pupils are equal, round and reactive to light, corrective eyeglasses in place.  Extraocular tracking is well-preserved, hearing grossly intact.  Face is symmetric with normal facial animation. Speech is clear without dysarthria noted. There is no hypophonia. There is no lip, neck/head, jaw or voice tremor. Neck is supple with full range of passive and active motion. There are no carotid bruits on auscultation. Oropharynx exam reveals: mild mouth dryness, moderate airway crowding, due to narrow airway entry, thicker soft palate, redundant soft palate and tonsils in place. Mallampati is class III. Tongue protrudes centrally and palate elevates symmetrically.   Chest: Clear to auscultation without wheezing, rhonchi or crackles noted.   Heart: S1+S2+0, regular and normal without murmurs, rubs or gallops noted.    Abdomen: Soft, non-tender and non-distended.   Extremities: There is trace pitting edema in the ankles bilaterally.   Skin: Warm and dry without trophic changes noted. There are no varicose veins.   Musculoskeletal: exam reveals no obvious joint deformities.    Neurologically:  Mental status: The patient is awake, alert and oriented in all 4 spheres. Her immediate and remote  memory, attention, language skills and fund of knowledge are appropriate. There is no evidence of aphasia, agnosia,  apraxia or anomia. Speech is clear with normal prosody and enunciation. Thought process is linear. Mood is normal and affect is normal.  Cranial nerves II - XII are as described above under HEENT exam.  Motor exam: Normal bulk, strength and tone is noted. There is no tremor. Fine motor skills and coordination: Grossly intact.  Cerebellar testing: No dysmetria or intention tremor. There is no truncal or gait ataxia.  Sensory exam: intact to light touch in the upper and lower extremities.  Gait, station and balance: She stands easily. No veering to one side is noted. No leaning to one side is noted. Posture is age-appropriate and stance is narrow based. Gait shows normal stride length and normal pace. No problems turning are noted.  Assessment and Plan:    In summary, [name] is a very pleasant 50 year old female with an underlying medical history of migraines, seizure disorder, allergies, anxiety, asthma, vitamin D deficiency, vitamin B12 deficiency, vertigo, and overweight state, whose history and physical exam are concerning for underlying obstructive sleep apnea (OSA). I discussed the possible diagnosis of sleep apnea, its prognosis and treatment options, we talked about positive airway pressure treatment with the help of an AutoPap or CPAP machine, dental device, the importance of weight management. I explained the risks and ramifications of untreated moderate to severe OSA, especially with respect to developing cardiovascular disease down the Road, including congestive heart failure, difficult to treat hypertension, cardiac arrhythmias, or stroke. Even type 2 diabetes has, in part, been linked to untreated OSA. Symptoms of untreated OSA include daytime sleepiness, memory problems, mood irritability and mood disorder such as depression and anxiety, lack of energy, as well as  recurrent headaches, especially morning headaches. We talked about trying to maintain a healthy lifestyle in general, as well as the importance of weight control and good sleep hygiene.  Your testing and we will keep her posted as to her test results by phone call as well.  We will plan to follow-up in sleep clinic accordingly.   I outlined the difference between a laboratory attended sleep study versus home sleep test.  I answered her questions today and she was in agreement.   Thank you very much for allowing me to participate in the care of this nice patient. If I can be of any further assistance to you please do not hesitate to talk to me.    Sincerely,     [name], MD, PhD

## 2021-08-15 ENCOUNTER — Ambulatory Visit (INDEPENDENT_AMBULATORY_CARE_PROVIDER_SITE_OTHER): Payer: BC Managed Care – PPO

## 2021-08-15 ENCOUNTER — Other Ambulatory Visit: Payer: Self-pay

## 2021-08-15 DIAGNOSIS — E538 Deficiency of other specified B group vitamins: Secondary | ICD-10-CM | POA: Diagnosis not present

## 2021-08-15 MED ORDER — CYANOCOBALAMIN 1000 MCG/ML IJ SOLN
1000.0000 ug | Freq: Once | INTRAMUSCULAR | Status: AC
  Administered 2021-08-15: 1000 ug via INTRAMUSCULAR

## 2021-08-15 NOTE — Progress Notes (Signed)
Patient presented for B 12 injection to right deltoid, patient voiced no concerns nor showed any signs of distress during injection. 

## 2021-08-17 ENCOUNTER — Ambulatory Visit: Payer: BC Managed Care – PPO | Admitting: Neurology

## 2021-08-17 DIAGNOSIS — R519 Headache, unspecified: Secondary | ICD-10-CM

## 2021-08-17 DIAGNOSIS — R0683 Snoring: Secondary | ICD-10-CM | POA: Diagnosis not present

## 2021-08-17 DIAGNOSIS — G4733 Obstructive sleep apnea (adult) (pediatric): Secondary | ICD-10-CM | POA: Diagnosis not present

## 2021-08-17 DIAGNOSIS — G4719 Other hypersomnia: Secondary | ICD-10-CM

## 2021-08-17 DIAGNOSIS — R635 Abnormal weight gain: Secondary | ICD-10-CM

## 2021-08-17 DIAGNOSIS — R351 Nocturia: Secondary | ICD-10-CM

## 2021-08-23 NOTE — Progress Notes (Signed)
   [facility]  HOME SLEEP TEST (Watch PAT) REPORT  STUDY DATE: 08/17/2021  DOB: April 17, 1971  MRN: [id number]  ORDERING CLINICIAN: [name], MD, PhD   REFERRING CLINICIAN: Dr. Helene Shoe, NP  CLINICAL INFORMATION/HISTORY: 50 year old right-handed woman with an underlying medical history of migraines, seizure disorder, allergies, anxiety, asthma, vitamin D deficiency, vitamin B12 deficiency, vertigo, and overweight state, who reports snoring and excessive daytime somnolence as well as morning headaches.   Epworth sleepiness score: 14/24.  BMI: 29.8 kg/m  FINDINGS:   Sleep Summary:   Total Recording Time (hours, min): 9 hours, 7 minutes  Total Sleep Time (hours, min):  8 hours, 12 minutes   Percent REM (%):    23.2%   Respiratory Indices:   Calculated pAHI (per hour):  15.4/hour         REM pAHI:    29.6/hour       NREM pAHI: 10.9/hour  Oxygen Saturation Statistics:    Oxygen Saturation (%) Mean: 94%   Minimum oxygen saturation (%):                 87%   O2 Saturation Range (%): 87-98%    O2 Saturation (minutes) <=88%: 0.2 min  Pulse Rate Statistics:   Pulse Mean (bpm):    67/min    Pulse Range (46-108/min)   IMPRESSION: OSA (obstructive sleep apnea)   RECOMMENDATION:  This home sleep test demonstrates moderate obstructive sleep apnea with a total AHI of 15.4/hour and O2 nadir of 87%.  Mild to moderate snoring was detected.  Treatment with positive airway pressure is recommended. The patient will be advised to proceed with an autoPAP titration/trial at home for now. A full night titration study may be considered to optimize treatment settings, if needed down the road.  Alternative treatment options may include weight loss, dental treatment with an oral appliance through dentistry or surgical options.  Concomitant weight loss is recommended.  Please note that untreated obstructive sleep apnea may carry additional perioperative  morbidity. Patients with significant obstructive sleep apnea should receive perioperative PAP therapy and the surgeons and particularly the anesthesiologist should be informed of the diagnosis and the severity of the sleep disordered breathing. The patient should be cautioned not to drive, work at heights, or operate dangerous or heavy equipment when tired or sleepy. Review and reiteration of good sleep hygiene measures should be pursued with any patient. Other causes of the patient's symptoms, including circadian rhythm disturbances, an underlying mood disorder, medication effect and/or an underlying medical problem cannot be ruled out based on this test. Clinical correlation is recommended. The patient and her referring provider will be notified of the test results. The patient will be seen in follow up in sleep clinic at [facility].  I certify that I have reviewed the raw data recording prior to the issuance of this report in accordance with the standards of the American Academy of Sleep Medicine (AASM).  INTERPRETING PHYSICIAN:   [name], MD, PhD  Board Certified in Neurology and Sleep Medicine  [facility] [address] [place], [place] [address] [phone]

## 2021-08-30 ENCOUNTER — Telehealth: Payer: Self-pay | Admitting: *Deleted

## 2021-08-30 ENCOUNTER — Encounter: Payer: Self-pay | Admitting: *Deleted

## 2021-08-30 NOTE — Addendum Note (Signed)
Addended by: [name] on: 08/30/2021 11:40 AM   Modules accepted: Orders

## 2021-08-30 NOTE — Telephone Encounter (Signed)
-----   Message from [name], MD sent at 08/30/2021 11:40 AM EST ----- Patient referred by Debbora Presto, NP, seen by me on 08/04/2021, patient had a HST on 08/17/2021.    Please call and notify the patient that the recent home sleep test showed obstructive sleep apnea in the moderate range. I recommend treatment in the form of autoPAP, which means, that we don't have to bring her in for a sleep study with CPAP, but will let her start using a so called autoPAP machine at home, which is a CPAP-like machine with self-adjusting pressures. We will send the order to a local DME company (of her choice, or as per insurance requirement). The DME representative will fit her with a mask, educate her on how to use the machine, how to put the mask on, etc. I have placed an order in the chart. Please send the order, talk to patient, send report to referring MD. We will need a FU in sleep clinic for 10 weeks post-PAP set up, please arrange that with me or one of our NPs. Also reinforce the need for compliance with treatment. Thanks,   [name], MD, PhD [facility])

## 2021-08-30 NOTE — Procedures (Signed)
   [facility]  HOME SLEEP TEST (Watch PAT) REPORT  STUDY DATE: 08/17/2021  DOB: 05/24/1971  MRN: [id number]  ORDERING CLINICIAN: [name], MD, PhD   REFERRING CLINICIAN: Dr. Helene Shoe, NP  CLINICAL INFORMATION/HISTORY: 51 year old right-handed woman with an underlying medical history of migraines, seizure disorder, allergies, anxiety, asthma, vitamin D deficiency, vitamin B12 deficiency, vertigo, and overweight state, who reports snoring and excessive daytime somnolence as well as morning headaches.   Epworth sleepiness score: 14/24.  BMI: 29.8 kg/m  FINDINGS:   Sleep Summary:   Total Recording Time (hours, min): 9 hours, 7 minutes  Total Sleep Time (hours, min):  8 hours, 12 minutes   Percent REM (%):    23.2%   Respiratory Indices:   Calculated pAHI (per hour):  15.4/hour         REM pAHI:    29.6/hour       NREM pAHI: 10.9/hour  Oxygen Saturation Statistics:    Oxygen Saturation (%) Mean: 94%   Minimum oxygen saturation (%):                 87%   O2 Saturation Range (%): 87-98%    O2 Saturation (minutes) <=88%: 0.2 min  Pulse Rate Statistics:   Pulse Mean (bpm):    67/min    Pulse Range (46-108/min)   IMPRESSION: OSA (obstructive sleep apnea)   RECOMMENDATION:  This home sleep test demonstrates moderate obstructive sleep apnea with a total AHI of 15.4/hour and O2 nadir of 87%.  Mild to moderate snoring was detected.  Treatment with positive airway pressure is recommended. The patient will be advised to proceed with an autoPAP titration/trial at home for now. A full night titration study may be considered to optimize treatment settings, if needed down the road.  Alternative treatment options may include weight loss, dental treatment with an oral appliance through dentistry or surgical options.  Concomitant weight loss is recommended.  Please note that untreated obstructive sleep apnea may carry additional perioperative  morbidity. Patients with significant obstructive sleep apnea should receive perioperative PAP therapy and the surgeons and particularly the anesthesiologist should be informed of the diagnosis and the severity of the sleep disordered breathing. The patient should be cautioned not to drive, work at heights, or operate dangerous or heavy equipment when tired or sleepy. Review and reiteration of good sleep hygiene measures should be pursued with any patient. Other causes of the patient's symptoms, including circadian rhythm disturbances, an underlying mood disorder, medication effect and/or an underlying medical problem cannot be ruled out based on this test. Clinical correlation is recommended. The patient and her referring provider will be notified of the test results. The patient will be seen in follow up in sleep clinic at [facility].  I certify that I have reviewed the raw data recording prior to the issuance of this report in accordance with the standards of the American Academy of Sleep Medicine (AASM).  INTERPRETING PHYSICIAN:   [name], MD, PhD  Board Certified in Neurology and Sleep Medicine  [facility] [address] [place], [place] [address] [phone]

## 2021-08-30 NOTE — Telephone Encounter (Signed)
Called patient and discussed results of home sleep test as noted below.  Patient understands moderate obstructive sleep apnea was found.  She is amenable to proceeding with AutoPap.  She is concerned about having something on her face however we did discuss that AutoPap is the preferred treatment for sleep apnea and they do have various masks so she may find one that is tolerable.  The patient's questions were answered.  We will send the orders to [facility].  Patient understands compliance requirements which includes using the machine at least 4 hours every night and also being seen in the clinic between 31 and 90 days after she is set up on her new machine.  The patient was scheduled for a follow-up on November 16, 2021 at 2:15 PM.  She understands to bring her machine and power cord.  She will call to reschedule if this appointment will not fall 31 to 90 days after she is set up on the machine.   Order faxed to [facility] [phone]. Received a receipt of confirmation. Letter mailed to pt.

## 2021-08-31 ENCOUNTER — Other Ambulatory Visit: Payer: Self-pay

## 2021-08-31 ENCOUNTER — Emergency Department (HOSPITAL_COMMUNITY)
Admission: EM | Admit: 2021-08-31 | Discharge: 2021-08-31 | Disposition: A | Discharge status: Home / Self Care | Payer: BC Managed Care – PPO | Attending: Emergency Medicine | Admitting: Emergency Medicine

## 2021-08-31 ENCOUNTER — Ambulatory Visit: Payer: BC Managed Care – PPO | Admitting: Primary Care

## 2021-08-31 ENCOUNTER — Encounter (HOSPITAL_COMMUNITY): Payer: Self-pay

## 2021-08-31 ENCOUNTER — Emergency Department (HOSPITAL_COMMUNITY): Payer: BC Managed Care – PPO

## 2021-08-31 DIAGNOSIS — J9811 Atelectasis: Secondary | ICD-10-CM | POA: Diagnosis not present

## 2021-08-31 DIAGNOSIS — R079 Chest pain, unspecified: Secondary | ICD-10-CM | POA: Diagnosis not present

## 2021-08-31 DIAGNOSIS — R0602 Shortness of breath: Secondary | ICD-10-CM | POA: Diagnosis not present

## 2021-08-31 DIAGNOSIS — R0789 Other chest pain: Secondary | ICD-10-CM | POA: Diagnosis not present

## 2021-08-31 LAB — CBC WITH DIFFERENTIAL/PLATELET
Abs Immature Granulocytes: 0.02 10*3/uL (ref 0.00–0.07)
Basophils Absolute: 0 10*3/uL (ref 0.0–0.1)
Basophils Relative: 1 %
Eosinophils Absolute: 0.2 10*3/uL (ref 0.0–0.5)
Eosinophils Relative: 3 %
HCT: 42.8 % (ref 36.0–46.0)
Hemoglobin: 14.3 g/dL (ref 12.0–15.0)
Immature Granulocytes: 0 %
Lymphocytes Relative: 27 %
Lymphs Abs: 1.3 10*3/uL (ref 0.7–4.0)
MCH: 31.6 pg (ref 26.0–34.0)
MCHC: 33.4 g/dL (ref 30.0–36.0)
MCV: 94.5 fL (ref 80.0–100.0)
Monocytes Absolute: 0.3 10*3/uL (ref 0.1–1.0)
Monocytes Relative: 6 %
Neutro Abs: 3.2 10*3/uL (ref 1.7–7.7)
Neutrophils Relative %: 63 %
Platelets: 199 10*3/uL (ref 150–400)
RBC: 4.53 MIL/uL (ref 3.87–5.11)
RDW: 11.9 % (ref 11.5–15.5)
WBC: 5 10*3/uL (ref 4.0–10.5)
nRBC: 0 % (ref 0.0–0.2)

## 2021-08-31 LAB — COMPREHENSIVE METABOLIC PANEL
ALT: 17 U/L (ref 0–44)
AST: 16 U/L (ref 15–41)
Albumin: 4.2 g/dL (ref 3.5–5.0)
Alkaline Phosphatase: 87 U/L (ref 38–126)
Anion gap: 5 (ref 5–15)
BUN: 9 mg/dL (ref 6–20)
CO2: 26 mmol/L (ref 22–32)
Calcium: 8.7 mg/dL — ABNORMAL LOW (ref 8.9–10.3)
Chloride: 107 mmol/L (ref 98–111)
Creatinine, Ser: 0.78 mg/dL (ref 0.44–1.00)
GFR, Estimated: >60 mL/min (ref >60)
Glucose, Bld: 75 mg/dL (ref 70–99)
Potassium: 3.6 mmol/L (ref 3.5–5.1)
Sodium: 138 mmol/L (ref 135–145)
Total Bilirubin: 0.5 mg/dL (ref 0.3–1.2)
Total Protein: 7.6 g/dL (ref 6.5–8.1)

## 2021-08-31 LAB — TROPONIN I (HIGH SENSITIVITY): Troponin I (High Sensitivity): <2 ng/L (ref <18)

## 2021-08-31 MED ORDER — ASPIRIN 81 MG PO CHEW
324.0000 mg | CHEWABLE_TABLET | Freq: Once | ORAL | Status: AC
  Administered 2021-08-31: 324 mg via ORAL
  Filled 2021-08-31: qty 4

## 2021-08-31 NOTE — ED Triage Notes (Signed)
Pt reports with sharp chest pain and SHOB since Monday.

## 2021-08-31 NOTE — ED Provider Notes (Signed)
Emergency Medicine Provider Triage Evaluation Note  [name] , a 51 y.o. female  was evaluated in triage.  Pt complains of chest pain.  Patient reports that she has had chest pain over the last 3 days.  Patient reports that on Monday pain was sharp, yesterday and today pain has been described as an ache.  Pain is located to mid sternum.  Patient states that pain is radiating to the left shoulder this morning.  Patient endorses associated shortness of breath.    Review of Systems  Positive: Chest pain, shortness of breath Negative: Palpitations, nausea, vomiting, diaphoresis  Physical Exam  BP 129/82 (BP Location: Right Arm)   Pulse 97   Temp 98.2 F (36.8 C) (Oral)   Resp 18   SpO2 98%  Gen:   Awake, no distress   Resp:  Normal effort MSK:   Moves extremities without difficulty, no swelling or tenderness to bilateral lower extremities. Other:    Medical Decision Making  Medically screening exam initiated at 6:24 AM.  Appropriate orders placed.  [name] was informed that the remainder of the evaluation will be completed by another provider, this initial triage assessment does not replace that evaluation, and the importance of remaining in the ED until their evaluation is complete.  ACS work-up initiated.  Patient ordered 324 mg of aspirin   Dyann Ruddle 08/31/21 [id number]    Merryl Hacker, MD 09/01/21 [phone]

## 2021-08-31 NOTE — ED Provider Notes (Signed)
[facility] DEPT Provider Note   CSN: [id number] Arrival date & time: 08/31/21  0556     History  Chief Complaint  Patient presents with   Chest Pain   Shortness of Breath    [name] is a 51 y.o. female.   Chest Pain Associated symptoms: shortness of breath   Shortness of Breath Associated symptoms: chest pain   Patient presents with left-sided shortness of breath.  Began 2 days ago with a sharp pain on the left parasternal area.  Initially sharp parasternal pain that began while she was driving.  That lasted most of the day at work.  Worse with movements.  Not worse necessarily with exertion.  Then pain is somewhat changed to more of a dull pain on the left upper chest.  States it goes to the left arm a little and that the arm just feels a little off.  Not weak and not having pain.  Not having exertional pain.  Slight shortness of breath at time.  No swelling in her legs.  Does not smoke.  Does have a cardiac history in her family but none personally.    Home Medications Prior to Admission medications   Medication Sig Start Date End Date Taking? Authorizing Provider  acetaminophen (TYLENOL) 500 MG tablet Take 1,000 mg by mouth every 6 (six) hours as needed for mild pain.   Yes [provider]  albuterol (VENTOLIN HFA) 108 (90 Base) MCG/ACT inhaler Inhale 2 puffs into the lungs every 6 (six) hours as needed for wheezing or shortness of breath. 06/16/21  Yes Pleas Koch, NP  escitalopram (LEXAPRO) 20 MG tablet TAKE 1 TABLET BY MOUTH ONCE DAILY FOR ANXIETY Patient taking differently: Take 20 mg by mouth daily. 10/27/20  Yes Pleas Koch, NP  ondansetron (ZOFRAN) 4 MG tablet Take 1 tablet (4 mg total) by mouth every 8 (eight) hours as needed for nausea or vomiting. 10/06/20  Yes Evelina Bucy, DPM  PHENobarbital (LUMINAL) 97.2 MG tablet Take 2 tablets (194.4 mg total) by mouth daily. 03/14/21  Yes Lomax, Amy, NP   rizatriptan (MAXALT-MLT) 10 MG disintegrating tablet Take 1 tablet (10 mg total) by mouth as needed for migraine. May repeat in 2 hours if needed Patient taking differently: Take 10 mg by mouth daily as needed for migraine. May repeat in 2 hours if needed 10/25/20  Yes Penumalli, Earlean Polka, MD  Vitamin D, Ergocalciferol, (DRISDOL) 1.25 MG (50000 UNIT) CAPS capsule Take 1 capsule by mouth once a week Patient taking differently: Take 50,000 Units by mouth every 7 (seven) days. Take 1 capsule by mouth once a week 08/02/21  Yes Pleas Koch, NP  fluticasone furoate-vilanterol (BREO ELLIPTA) 200-25 MCG/ACT AEPB Inhale 1 puff into the lungs daily. For asthma, rinse mouth after each use Patient not taking: Reported on 08/31/2021 06/15/21   Pleas Koch, NP  predniSONE (DELTASONE) 20 MG tablet Take 2 tablets by mouth once daily for 5 days for asthma flare Patient not taking: Reported on 08/31/2021 06/15/21   Pleas Koch, NP      Allergies    Asa buff (mag [buffered aspirin], Azithromycin, Ibuprofen, and Penicillins    Review of Systems   Review of Systems  Respiratory:  Positive for shortness of breath.   Cardiovascular:  Positive for chest pain.   Physical Exam Updated Vital Signs BP (!) 142/70 (BP Location: Left Arm)   Pulse (!) 58   Temp 98.2 F (36.8 C) (Oral)  Resp 18   SpO2 100%  Physical Exam Vitals and nursing note reviewed.  Cardiovascular:     Rate and Rhythm: Normal rate and regular rhythm.  Pulmonary:     Breath sounds: No wheezing.  Chest:     Chest wall: Tenderness present.     Comments: Tenderness to left parasternal area and left chest. Abdominal:     Tenderness: There is no abdominal tenderness.  Musculoskeletal:     Right lower leg: No edema.     Left lower leg: No edema.  Neurological:     Mental Status: She is alert.    ED Results / Procedures / Treatments   Labs (all labs ordered are listed, but only abnormal results are displayed) Labs  Reviewed  COMPREHENSIVE METABOLIC PANEL - Abnormal; Notable for the following components:      Result Value   Calcium 8.7 (*)    All other components within normal limits  CBC WITH DIFFERENTIAL/PLATELET  TROPONIN I (HIGH SENSITIVITY)  TROPONIN I (HIGH SENSITIVITY)    EKG EKG Interpretation  Date/Time:  Wednesday August 31 2021 06:17:13 EST Ventricular Rate:  88 PR Interval:  139 QRS Duration: 92 QT Interval:  371 QTC Calculation: 449 R Axis:   50 Text Interpretation: Sinus rhythm Low voltage, precordial leads No significant change since last tracing Confirmed by Davonna Belling [phone]) on 08/31/2021 3:27:31 PM  Radiology DG Chest Portable 1 View  Result Date: 08/31/2021 CLINICAL DATA:  Chest pain and tightness. EXAM: PORTABLE CHEST 1 VIEW COMPARISON:  10/09/2019 FINDINGS: 0618 hours. Low lung volumes. The lungs are clear without focal pneumonia, edema, pneumothorax or pleural effusion. Trace atelectasis noted in the bases. The cardiopericardial silhouette is within normal limits for size. The visualized bony structures of the thorax show no acute abnormality. Telemetry leads overlie the chest. IMPRESSION: Low volume film with basilar atelectasis. Electronically Signed   By: Misty Stanley M.D.   On: 08/31/2021 07:05    Procedures Procedures    Medications Ordered in ED Medications  aspirin chewable tablet 324 mg (324 mg Oral Given 08/31/21 2703)    ED Course/ Medical Decision Making/ A&P                           Medical Decision Making Patient with chest pain.  Differential diagnosis initially includes MI, nonspecific chest pain, pulm embolism, pneumonia.  Reviewed chest x-ray EKG and lab work.  Interpreted to show reassuring chest x-ray reassuring EKG and negative troponin.  Doubt cardiac cause of ischemia.  Overall low risk.  Patient is reproducible pain.  No rash but early zoster considered.  Appears stable for discharge home.  Will have follow-up with cardiology as  needed.  Discharged with        Final Clinical Impression(s) / ED Diagnoses Final diagnoses:  Nonspecific chest pain    Rx / DC Orders ED Discharge Orders     None         Davonna Belling, MD 08/31/21 1649

## 2021-09-02 ENCOUNTER — Other Ambulatory Visit: Payer: Self-pay

## 2021-09-02 ENCOUNTER — Ambulatory Visit: Payer: BC Managed Care – PPO | Admitting: Adult Health

## 2021-09-02 ENCOUNTER — Encounter: Payer: Self-pay | Admitting: Adult Health

## 2021-09-02 VITALS — BP 124/72 | HR 72 | Ht 65.0 in | Wt 174.8 lb

## 2021-09-02 DIAGNOSIS — E782 Mixed hyperlipidemia: Secondary | ICD-10-CM | POA: Diagnosis not present

## 2021-09-02 DIAGNOSIS — M25512 Pain in left shoulder: Secondary | ICD-10-CM | POA: Diagnosis not present

## 2021-09-02 DIAGNOSIS — J4520 Mild intermittent asthma, uncomplicated: Secondary | ICD-10-CM | POA: Diagnosis not present

## 2021-09-02 NOTE — Progress Notes (Signed)
Cardiology Clinic Note   Patient Name: [name] Date of Encounter: 09/02/2021  Primary Care Provider:  Pleas Koch, NP Primary Cardiologist:  Sandy Munroe, MD  Patient Profile    51 year old female with history of Asthma, GERD, Migraine headaches, chest pain felt to be musculoskeletal, seizures.    Past Medical History    Past Medical History:  Diagnosis Date   Allergy    Anxiety    Asthma    worse when younger   GERD (gastroesophageal reflux disease)    Migraine    1-2x/month   Persistent cough for 3 weeks or longer 10/09/2019   PONV (postoperative nausea and vomiting)    Seizure ([place])    Vertigo    no episodes for 7 yrs   Past Surgical History:  Procedure Laterality Date   ABDOMINAL HYSTERECTOMY  2012   COLONOSCOPY     COLONOSCOPY WITH PROPOFOL N/A 04/24/2019   Procedure: COLONOSCOPY WITH PROPOFOL;  Surgeon: Virgel Manifold, MD;  Location: [facility] ENDOSCOPY;  Service: Endoscopy;  Laterality: N/A;   ESOPHAGOGASTRODUODENOSCOPY (EGD) WITH PROPOFOL N/A 04/24/2019   Procedure: ESOPHAGOGASTRODUODENOSCOPY (EGD) WITH PROPOFOL;  Surgeon: Virgel Manifold, MD;  Location: [facility] ENDOSCOPY;  Service: Endoscopy;  Laterality: N/A;   HAND SURGERY Right 05/07/2020   trigger release   UPPER GASTROINTESTINAL ENDOSCOPY      Allergies  Allergies  Allergen Reactions   Asa Buff (Mag [Buffered Aspirin] Other (See Comments)    Stomach pain   Azithromycin Other (See Comments)    GI problems   Ibuprofen Other (See Comments)    GI upset   Penicillins Other (See Comments)    Unknown childhood allergic reaction Has patient had a PCN reaction causing immediate rash, facial/tongue/throat swelling, SOB or lightheadedness with hypotension: Unknown Has patient had a PCN reaction causing severe rash involving mucus membranes or skin necrosis: Unknown Has patient had a PCN reaction that required hospitalization: Unknown Has patient had a PCN reaction occurring  within the last 10 years: No If all of the above answers are "NO", then may proceed with Cephalosporin use.    History of Present Illness   [name] presents for post ED follow up after being seen for sharp pain in the left parasternal area with associated shortness of breath while driving. Then changed to dull pain. EKG was normal and she was ruled out for ACS. Pain was reproducible.   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  She states the pain gets worse with this, but she has had constant pain for several days.  Sometimes beginning as sharp and at other times becoming dull, mostly located in the shoulder area but also in the pectoral area on the left with some numbness in her left arm.  She denies any other associated symptoms of shortness of breath or diaphoresis.  Home Medications    Current Outpatient Medications  Medication Sig Dispense Refill   acetaminophen (TYLENOL) 500 MG tablet Take 1,000 mg by mouth every 6 (six) hours as needed for mild pain.     albuterol (VENTOLIN HFA) 108 (90 Base) MCG/ACT inhaler Inhale 2 puffs into the lungs every 6 (six) hours as needed for wheezing or shortness of breath. 18 g 0   escitalopram (LEXAPRO) 20 MG tablet TAKE 1 TABLET BY MOUTH ONCE DAILY FOR ANXIETY (Patient taking differently: Take 20 mg  by mouth daily.) 90 tablet 3   fluticasone furoate-vilanterol (BREO ELLIPTA) 200-25 MCG/ACT AEPB Inhale 1 puff into the lungs daily. For asthma, rinse mouth after each use 60 each 2   ondansetron (ZOFRAN) 4 MG tablet Take 1 tablet (4 mg total) by mouth every 8 (eight) hours as needed for nausea or vomiting. 20 tablet 0   PHENobarbital (LUMINAL) 97.2 MG tablet Take 2 tablets (194.4 mg total) by mouth daily. 180 tablet 1   rizatriptan (MAXALT-MLT) 10 MG disintegrating tablet Take 1 tablet (10 mg total) by mouth as needed for migraine. May repeat  in 2 hours if needed (Patient taking differently: Take 10 mg by mouth daily as needed for migraine. May repeat in 2 hours if needed) 9 tablet 5   Vitamin D, Ergocalciferol, (DRISDOL) 1.25 MG (50000 UNIT) CAPS capsule Take 1 capsule by mouth once a week (Patient taking differently: Take 50,000 Units by mouth every 7 (seven) days. Take 1 capsule by mouth once a week) 12 capsule 0   predniSONE (DELTASONE) 20 MG tablet Take 2 tablets by mouth once daily for 5 days for asthma flare (Patient not taking: Reported on 08/31/2021) 10 tablet 0   No current facility-administered medications for this visit.     Family History    Family History  Problem Relation Age of Onset   Hypertension Mother    Colon cancer Neg Hx    Esophageal cancer Neg Hx    Stomach cancer Neg Hx    Rectal cancer Neg Hx    She indicated that her mother is alive. She indicated that her father is alive. She indicated that her sister is alive. She indicated that her brother is alive. She indicated that the status of her neg hx is unknown.  Social History    Social History   Socioeconomic History   Marital status: Widowed    Spouse name: [name]   Number of children: 0   Years of education: 10th   Highest education level: Not on file  Occupational History    Employer: [id number]  Tobacco Use   Smoking status: Former    Packs/day: 1.00    Types: Cigarettes    Quit date: 08/28/2013    Years since quitting: 8.0   Smokeless tobacco: Never   Tobacco comments:    Quit Janurary 1st  Vaping Use   Vaping Use: Never used  Substance and Sexual Activity   Alcohol use: No    Alcohol/week: 0.0 standard drinks   Drug use: No   Sexual activity: Not Currently    Birth control/protection: Surgical  Other Topics Concern   Not on file  Social History Narrative   Patient lives alone   No children.   Works at [employer].   Enjoys going to [employer], going to estate sells.   Right handed   Caffeine: 1 Dr. [name] all day long (5-6)    Social Determinants of Health   Financial Resource Strain: Not on file  Food Insecurity: Not on file  Transportation Needs: Not on file  Physical Activity: Not on file  Stress: Not on file  Social Connections: Not on file  Intimate Partner Violence: Not on file     Review of Systems    General:  No chills, fever, night sweats or weight changes.  Cardiovascular:  No chest pain, dyspnea on exertion, edema, orthopnea, palpitations, paroxysmal nocturnal dyspnea.  Radial pulses are palpable at 1+. Dermatological: No rash, lesions/masses Respiratory: No cough, dyspnea Urologic: No hematuria, dysuria Abdominal:  No nausea, vomiting, diarrhea, bright red blood per rectum, melena, or hematemesis Neurologic:  No visual changes, wkns, changes in mental status. Musculoskeletal: Pain with range of motion of the left shoulder, raising left arm above her head, abduction, adduction, pain with palpation of the left shoulder and left pectoral area. All other systems reviewed and are otherwise negative except as noted above.     Physical Exam    VS:  BP 124/72   Pulse 72   Ht 5\' 5"  (1.651 m)   Wt 174 lb 12.8 oz (79.3 kg)   SpO2 93%   BMI 29.09 kg/m  , BMI Body mass index is 29.09 kg/m.     GEN: Well nourished, well developed, in no acute distress. HEENT: normal. Neck: Supple, no JVD, carotid bruits, or masses. Cardiac: RRR, no murmurs, rubs, or gallops. No clubbing, cyanosis, edema.  Radials/DP/PT 2+ and equal bilaterally.  Respiratory:  Respirations regular and unlabored, clear to auscultation bilaterally. GI: Soft, nontender, nondistended, BS + x 4. MS: no deformity or atrophy.  Pain with range of motion of the left shoulder, reproducible pain with palpation. Skin: warm and dry, no rash. Neuro:  Strength and sensation are intact. Psych: Normal affect.  Accessory Clinical Findings      Lab Results  Component Value Date   WBC 5.0 08/31/2021   HGB 14.3 08/31/2021   HCT 42.8  08/31/2021   MCV 94.5 08/31/2021   PLT 199 08/31/2021   Lab Results  Component Value Date   CREATININE 0.78 08/31/2021   BUN 9 08/31/2021   NA 138 08/31/2021   K 3.6 08/31/2021   CL 107 08/31/2021   CO2 26 08/31/2021   Lab Results  Component Value Date   ALT 17 08/31/2021   AST 16 08/31/2021   ALKPHOS 87 08/31/2021   BILITOT 0.5 08/31/2021   Lab Results  Component Value Date   CHOL 233 (H) 10/27/2020   HDL 48.20 10/27/2020   LDLCALC 117 (H) 11/30/2017   LDLDIRECT 137.0 10/27/2020   TRIG 229.0 (H) 10/27/2020   CHOLHDL 5 10/27/2020    Lab Results  Component Value Date   HGBA1C 5.4 08/13/2019    Review of Prior Studies: Echocardiogram 04/07/2019 1. The left ventricle has normal systolic function, with an ejection  fraction of 55-60%. The cavity size was normal. Left ventricular diastolic  parameters were normal. No evidence of left ventricular regional wall  motion abnormalities.   2. The right ventricle has normal systolic function. The cavity was  normal. There is no increase in right ventricular wall thickness. Right  ventricular systolic pressure could not be assessed.   3. The aorta is normal in size and structure.   Assessment & Plan   1.  Left-sided chest pain and left shoulder pain: I believe this is musculoskeletal and related to her left shoulder.  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  The pain is been going on for several months, is persistent and waxes and wanes concerning intensity, and quality from sharp to dull with associated numbness in her left arm.  I am going to refer her to [facility] as she has been seen by them in the past for other issues.  In the interim of asked her to use a warm heating pad or wearable stick-on heating pad, take Tylenol as needed pain.  2.  Mixed hyperlipidemia: Recent labs by PCP on 05/14/2021 total cholesterol 168 triglycerides 385 LDL 61, HDL 30.  She is not on any statin  medications at this time.  Would recommend statin therapy to keep cholesterol level less than 150.  Follow-up with PCP for ongoing management.  3.  History of asthma: Followed by PCP.  Current medicines are reviewed at length with the patient today.  I have spent  25 min's  dedicated to the care of this patient on the date of this encounter to include pre-visit review of records, assessment, management and diagnostic testing,with shared decision making.  Signed, Phill Myron. West Pugh, ANP, AACC Yes you are 09/02/2021 12:11 PM    [address] Office [phone] Fax [phone]  Notice: This dictation was prepared with Dragon dictation along with smaller phrase technology. Any transcriptional errors that result from this process are unintentional and may not be corrected upon review.

## 2021-09-02 NOTE — Patient Instructions (Signed)
Medication Instructions:  No Changes *If you need a refill on your cardiac medications before your next appointment, please call your pharmacy*   Lab Work: No Labs If you have labs (blood work) drawn today and your tests are completely normal, you will receive your results only by: [place] (if you have MyChart) OR A paper copy in the mail If you have any lab test that is abnormal or we need to change your treatment, we will call you to review the results.   Testing/Procedures: No Testing   Follow-Up: At [facility], you and your health needs are our priority.  As part of our continuing mission to provide you with exceptional heart care, we have created designated Provider Care Teams.  These Care Teams include your primary Cardiologist (physician) and Advanced Practice Providers (APPs -  Physician Assistants and Nurse Practitioners) who all work together to provide you with the care you need, when you need it.  We recommend signing up for the patient portal called "MyChart".  Sign up information is provided on this After Visit Summary.  MyChart is used to connect with patients for Virtual Visits (Telemedicine).  Patients are able to view lab/test results, encounter notes, upcoming appointments, etc.  Non-urgent messages can be sent to your provider as well.   To learn more about what you can do with MyChart, go to [email or web address].    Your next appointment:   6 month(s)  The format for your next appointment:   In Person  Provider:   Elouise Munroe, MD     Other Instructions Tylenol every 8 hour as needed. Warm heating pad or pain patches as needed every 8 hours.

## 2021-09-05 ENCOUNTER — Other Ambulatory Visit: Payer: Self-pay

## 2021-09-05 ENCOUNTER — Other Ambulatory Visit (INDEPENDENT_AMBULATORY_CARE_PROVIDER_SITE_OTHER): Payer: BC Managed Care – PPO

## 2021-09-05 DIAGNOSIS — E538 Deficiency of other specified B group vitamins: Secondary | ICD-10-CM | POA: Diagnosis not present

## 2021-09-05 DIAGNOSIS — E559 Vitamin D deficiency, unspecified: Secondary | ICD-10-CM | POA: Diagnosis not present

## 2021-09-05 LAB — VITAMIN B12: Vitamin B-12: 262 pg/mL (ref 211–911)

## 2021-09-05 LAB — VITAMIN D 25 HYDROXY (VIT D DEFICIENCY, FRACTURES): VITD: 27.16 ng/mL — ABNORMAL LOW (ref 30.00–100.00)

## 2021-09-08 ENCOUNTER — Other Ambulatory Visit: Payer: Self-pay | Admitting: Primary Care

## 2021-09-08 DIAGNOSIS — E559 Vitamin D deficiency, unspecified: Secondary | ICD-10-CM

## 2021-09-08 MED ORDER — VITAMIN D (ERGOCALCIFEROL) 1.25 MG (50000 UNIT) PO CAPS: ORAL_CAPSULE | ORAL | 1 refills | Status: DC

## 2021-09-16 ENCOUNTER — Encounter: Payer: Self-pay | Admitting: Podiatry

## 2021-09-19 ENCOUNTER — Ambulatory Visit (INDEPENDENT_AMBULATORY_CARE_PROVIDER_SITE_OTHER): Payer: BC Managed Care – PPO

## 2021-09-19 ENCOUNTER — Ambulatory Visit: Payer: BC Managed Care – PPO

## 2021-09-19 ENCOUNTER — Other Ambulatory Visit: Payer: Self-pay

## 2021-09-19 DIAGNOSIS — E538 Deficiency of other specified B group vitamins: Secondary | ICD-10-CM | POA: Diagnosis not present

## 2021-09-19 MED ORDER — CYANOCOBALAMIN 1000 MCG/ML IJ SOLN
1000.0000 ug | Freq: Once | INTRAMUSCULAR | Status: AC
  Administered 2021-09-19: 1000 ug via INTRAMUSCULAR

## 2021-09-19 NOTE — Progress Notes (Signed)
[name] presents today for injection per MD orders. B12 injection administered IM in right Upper Arm. Administration without incident. Patient tolerated well. Jacque Garrels,cma

## 2021-10-18 ENCOUNTER — Ambulatory Visit: Payer: BC Managed Care – PPO | Admitting: General Practice

## 2021-10-20 ENCOUNTER — Other Ambulatory Visit: Payer: Self-pay

## 2021-10-20 ENCOUNTER — Ambulatory Visit (INDEPENDENT_AMBULATORY_CARE_PROVIDER_SITE_OTHER): Payer: BC Managed Care – PPO

## 2021-10-20 DIAGNOSIS — E538 Deficiency of other specified B group vitamins: Secondary | ICD-10-CM | POA: Diagnosis not present

## 2021-10-20 MED ORDER — CYANOCOBALAMIN 1000 MCG/ML IJ SOLN
1000.0000 ug | Freq: Once | INTRAMUSCULAR | Status: AC
  Administered 2021-10-20: 1000 ug via INTRAMUSCULAR

## 2021-10-20 NOTE — Progress Notes (Signed)
Per orders of Allie Bossier, monthly injection of B12 given by Loreen Freud. Patient tolerated injection well.

## 2021-10-25 ENCOUNTER — Other Ambulatory Visit: Payer: Self-pay | Admitting: Primary Care

## 2021-10-25 DIAGNOSIS — F411 Generalized anxiety disorder: Secondary | ICD-10-CM

## 2021-10-25 NOTE — Telephone Encounter (Signed)
Support pool:  Patient is due in March or April for CPE/follow-up.  Please schedule.

## 2021-10-27 ENCOUNTER — Other Ambulatory Visit: Payer: Self-pay

## 2021-10-27 DIAGNOSIS — G40B09 Juvenile myoclonic epilepsy, not intractable, without status epilepticus: Secondary | ICD-10-CM

## 2021-10-27 MED ORDER — PHENOBARBITAL 97.2 MG PO TABS: 194.4000 mg | ORAL_TABLET | Freq: Every day | ORAL | 0 refills | Status: DC

## 2021-10-27 NOTE — Progress Notes (Signed)
Last OV was on 08/04/21.  Next OV is scheduled for 11/16/21 .  Last RX was written on 08/01/21 for 180 tabs.   [place] Drug Database has been reviewed.

## 2021-10-27 NOTE — Telephone Encounter (Signed)
Tried to reach pt to schedule a cpe/follow-up but no answer from pt.

## 2021-11-16 ENCOUNTER — Ambulatory Visit: Payer: BC Managed Care – PPO | Admitting: Neurology

## 2021-11-17 ENCOUNTER — Other Ambulatory Visit: Payer: Self-pay

## 2021-11-17 ENCOUNTER — Ambulatory Visit (INDEPENDENT_AMBULATORY_CARE_PROVIDER_SITE_OTHER): Payer: BC Managed Care – PPO

## 2021-11-17 DIAGNOSIS — E538 Deficiency of other specified B group vitamins: Secondary | ICD-10-CM | POA: Diagnosis not present

## 2021-11-17 MED ORDER — CYANOCOBALAMIN 1000 MCG/ML IJ SOLN
1000.0000 ug | Freq: Once | INTRAMUSCULAR | Status: AC
  Administered 2021-11-17: 1000 ug via INTRAMUSCULAR

## 2021-11-17 NOTE — Progress Notes (Signed)
Pt came in today to receive her vit B12 injection given in the right deltoid IM successfully

## 2021-12-13 ENCOUNTER — Ambulatory Visit: Payer: BC Managed Care – PPO | Admitting: Primary Care

## 2021-12-13 ENCOUNTER — Encounter: Payer: Self-pay | Admitting: Primary Care

## 2021-12-13 DIAGNOSIS — E663 Overweight: Secondary | ICD-10-CM | POA: Diagnosis not present

## 2021-12-13 NOTE — Assessment & Plan Note (Signed)
Long discussion today regarding her diet and the need to start exercising.   We discussed what constitutes a healthy diet, to stop eating fast food, to stop sugary drinks, to increase pressures and vegetables/lean protein, to start exercising, to increase water intake to 64 ounces daily.  We discussed websites for low and healthy calorie recipes, mobile app such as my fitness pal.  All questions were answered.  Will not be prescribing weight loss medication

## 2021-12-13 NOTE — Progress Notes (Signed)
Subjective:    Patient ID: [name], female    DOB: 1971-07-01, 51 y.o.   MRN: [id number]  HPI  [name] is a very pleasant 51 y.o. female with a history of migraines, asthma, GERD, epilepsy, fatigue, GAD, vitamin B12 deficiency, vitamin D deficiency who presents today to discuss obesity.  She is frustrated by her weight despite eating 0-3 times daily, drinking water. Her overweight appearance makes her feel bad about herself. She is not exercising. She is requesting a medication to curb her appetite.   Diet currently consists of:  Breakfast: Skips sometimes, sausage/gravy biscuit, pancakes - Fast food Lunch: Skips sometimes, Fast food Dinner: Take out food Snacks: Occasionally, granola bars Desserts: 4-7 times weekly  Beverages: Water - 2-3 bottles daily, Dr. [name] 24-36 ounces daily, sweet tea.   Exercise: None    BP Readings from Last 3 Encounters:  12/13/21 118/74  09/02/21 124/72  08/31/21 (!) 142/70   Wt Readings from Last 3 Encounters:  12/13/21 175 lb (79.4 kg)  09/02/21 174 lb 12.8 oz (79.3 kg)  08/04/21 179 lb 12.8 oz (81.6 kg)     Review of Systems  Respiratory:  Negative for shortness of breath.   Cardiovascular:  Negative for chest pain.  Neurological:  Negative for headaches.        Past Medical History:  Diagnosis Date   Allergy    Anxiety    Asthma    worse when younger   GERD (gastroesophageal reflux disease)    Migraine    1-2x/month   Persistent cough for 3 weeks or longer 10/09/2019   PONV (postoperative nausea and vomiting)    Seizure ([place])    Vertigo    no episodes for 7 yrs    Social History   Socioeconomic History   Marital status: Widowed    Spouse name: [name]   Number of children: 0   Years of education: 10th   Highest education level: Not on file  Occupational History    Employer: [email or web address]  Tobacco Use   Smoking status: Former    Packs/day: 1.00    Types: Cigarettes    Quit date: 08/28/2013     Years since quitting: 8.2   Smokeless tobacco: Never   Tobacco comments:    Quit Janurary 1st  Vaping Use   Vaping Use: Never used  Substance and Sexual Activity   Alcohol use: No    Alcohol/week: 0.0 standard drinks   Drug use: No   Sexual activity: Not Currently    Birth control/protection: Surgical  Other Topics Concern   Not on file  Social History Narrative   Patient lives alone   No children.   Works at [employer].   Enjoys going to [employer], going to estate sells.   Right handed   Caffeine: 1 Dr. [name] all day long (5-6)   Social Determinants of Health   Financial Resource Strain: Not on file  Food Insecurity: Not on file  Transportation Needs: Not on file  Physical Activity: Not on file  Stress: Not on file  Social Connections: Not on file  Intimate Partner Violence: Not on file    Past Surgical History:  Procedure Laterality Date   ABDOMINAL HYSTERECTOMY  2012   COLONOSCOPY     COLONOSCOPY WITH PROPOFOL N/A 04/24/2019   Procedure: COLONOSCOPY WITH PROPOFOL;  Surgeon: Virgel Manifold, MD;  Location: [facility] END[facility]COPY;  Service: Endoscopy;  Laterality: N/A;   ESOPHAGOGASTRODUODENOSCOPY (EGD) WITH PROPOFOL N/A 04/24/2019  Procedure: ESOPHAGOGASTRODUODENOSCOPY (EGD) WITH PROPOFOL;  Surgeon: Virgel Manifold, MD;  Location: [facility] END[facility]COPY;  Service: Endoscopy;  Laterality: N/A;   HAND SURGERY Right 05/07/2020   trigger release   UPPER GASTROINTESTINAL ENDOSCOPY      Family History  Problem Relation Age of Onset   Hypertension Mother    Colon cancer Neg Hx    Esophageal cancer Neg Hx    Stomach cancer Neg Hx    Rectal cancer Neg Hx     Allergies  Allergen Reactions   Asa Buff (Mag [Buffered Aspirin] Other (See Comments)    Stomach pain   Azithromycin Other (See Comments)    GI problems   Ibuprofen Other (See Comments)    GI upset   Penicillins Other (See Comments)    Unknown childhood allergic reaction Has patient had a PCN reaction causing  immediate rash, facial/tongue/throat swelling, SOB or lightheadedness with hypotension: Unknown Has patient had a PCN reaction causing severe rash involving mucus membranes or skin necrosis: Unknown Has patient had a PCN reaction that required hospitalization: Unknown Has patient had a PCN reaction occurring within the last 10 years: No If all of the above answers are "NO", then may proceed with Cephalosporin use.    Current Outpatient Medications on File Prior to Visit  Medication Sig Dispense Refill   escitalopram (LEXAPRO) 20 MG tablet TAKE 1 TABLET BY MOUTH ONCE DAILY FOR ANXIETY 90 tablet 0   ondansetron (ZOFRAN) 4 MG tablet Take 1 tablet (4 mg total) by mouth every 8 (eight) hours as needed for nausea or vomiting. 20 tablet 0   PHENobarbital (LUMINAL) 97.2 MG tablet Take 2 tablets (194.4 mg total) by mouth daily. 180 tablet 0   rizatriptan (MAXALT-MLT) 10 MG disintegrating tablet Take 1 tablet (10 mg total) by mouth as needed for migraine. May repeat in 2 hours if needed (Patient taking differently: Take 10 mg by mouth daily as needed for migraine. May repeat in 2 hours if needed) 9 tablet 5   Vitamin D, Ergocalciferol, (DRISDOL) 1.25 MG (50000 UNIT) CAPS capsule Take 1 capsule by mouth once a week 12 capsule 1   No current facility-administered medications on file prior to visit.    BP 118/74   Pulse 84   Temp 98.6 F (37 C) (Oral)   Ht '5\' 5"'$  (1.651 m)   Wt 175 lb (79.4 kg)   SpO2 97%   BMI 29.12 kg/m  Objective:   Physical Exam Cardiovascular:     Rate and Rhythm: Normal rate and regular rhythm.  Pulmonary:     Effort: Pulmonary effort is normal.     Breath sounds: Normal breath sounds.  Musculoskeletal:     Cervical back: Neck supple.  Skin:    General: Skin is warm and dry.          Assessment & Plan:      This visit occurred during the SARS-CoV-2 public health emergency.  Safety protocols were in place, including screening questions prior to the visit,  additional usage of staff PPE, and extensive cleaning of exam room while observing appropriate contact time as indicated for disinfecting solutions.

## 2021-12-13 NOTE — Patient Instructions (Addendum)
It's important to improve your diet by reducing consumption of fast food, fried food, processed snack foods, sugary drinks. Increase consumption of fresh vegetables and fruits, whole grains, water.  Ensure you are drinking 64 ounces of water daily.  Start exercising. You should be getting 150 minutes of moderate intensity exercise weekly.  Look up low calorie and healthy meals:  SkinnyTaste.com Pinterest  AllRecipes.com FoodNetwork.com  It was a pleasure to see you today!

## 2021-12-21 ENCOUNTER — Ambulatory Visit (INDEPENDENT_AMBULATORY_CARE_PROVIDER_SITE_OTHER): Payer: BC Managed Care – PPO

## 2021-12-21 DIAGNOSIS — E538 Deficiency of other specified B group vitamins: Secondary | ICD-10-CM | POA: Diagnosis not present

## 2021-12-21 MED ORDER — CYANOCOBALAMIN 1000 MCG/ML IJ SOLN
1000.0000 ug | Freq: Once | INTRAMUSCULAR | Status: AC
  Administered 2021-12-21: 1000 ug via INTRAMUSCULAR

## 2021-12-21 NOTE — Progress Notes (Signed)
Pt came in the afternoon to receive her vit b12 injection, pt was given injection  IM Vit B12 1,000 mcg/ml in the rt deltoid successfully w/o any concerns.

## 2022-01-04 ENCOUNTER — Ambulatory Visit (INDEPENDENT_AMBULATORY_CARE_PROVIDER_SITE_OTHER): Payer: BC Managed Care – PPO

## 2022-01-04 ENCOUNTER — Encounter: Payer: Self-pay | Admitting: Podiatry

## 2022-01-04 ENCOUNTER — Ambulatory Visit: Payer: BC Managed Care – PPO | Admitting: Podiatry

## 2022-01-04 DIAGNOSIS — M79671 Pain in right foot: Secondary | ICD-10-CM

## 2022-01-04 DIAGNOSIS — L600 Ingrowing nail: Secondary | ICD-10-CM | POA: Diagnosis not present

## 2022-01-04 DIAGNOSIS — M722 Plantar fascial fibromatosis: Secondary | ICD-10-CM

## 2022-01-04 DIAGNOSIS — D2121 Benign neoplasm of connective and other soft tissue of right lower limb, including hip: Secondary | ICD-10-CM | POA: Diagnosis not present

## 2022-01-04 MED ORDER — TRIAMCINOLONE ACETONIDE 10 MG/ML IJ SUSP
10.0000 mg | Freq: Once | INTRAMUSCULAR | Status: AC
  Administered 2022-01-04: 10 mg

## 2022-01-04 NOTE — Patient Instructions (Signed)

## 2022-01-05 NOTE — Progress Notes (Signed)
Subjective:   Patient ID: [name], female   DOB: 51 y.o.   MRN: [id number]   HPI Patient presents with chronic ingrown toenail of the big toes of both feet which have been bothersome for her.  Also states that she wants to get the nodule in the bottom of her right arch fixed like the last one in her right heel has become very sore over the last 4 months.  Patient states this has become gradually more of an issue for her over this last period of time and more painful and did well with the surgery on the left foot   ROS      Objective:  Physical Exam  Neurovascular status intact muscle strength adequate range of motion within normal limits.  Patient is noted to have a painful nodule plantar aspect right arch measuring 2.5 cm x 1.5 cm and is noted to have exquisite discomfort plantar heel right and damaged hallux nails of both feet with looseness of the right hallux nail and pain on both the medial lateral borders of both feet.  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  We first discussed the ingrown toenails and she wants this fixed and I allowed her to read consent form for removal of the nail borders but I did explain she may ultimately lose the entire nail may require permanent nail procedure for the entire toe.  Patient at this point has both hallux is anesthetized 60 mg like Marcaine mixture sterile prep done bilateral and using sterile instrumentation remove the medial lateral borders of the hallux bilateral exposed matrix applied phenol 3 applications 30 seconds followed by alcohol lavage sterile dressing gave instructions on soaks leave dressings on 24 hours take them off earlier if  throbbing were to occur and instructed her on care at home.  Sterile prep injected the plantar fascia right 3 mg Kenalog 5 mg Xylocaine and she decided to do the surgery at the end of the month we allowed her to read consent form going over all possible alternative treatments complications for plantar fibroma excision patient understands surgery wants it done understands that just because 1 foot doing 1 weight no guarantee the other 1 will and recovery can take 6 months.  Patient scheduled outpatient surgery and is encouraged to call with questions

## 2022-01-06 ENCOUNTER — Telehealth: Payer: Self-pay

## 2022-01-06 NOTE — Telephone Encounter (Signed)
DOS 01/24/2022  PLANTAR FIBROMA RT - 28062  BCBS EFFECTIVE DATE - 08/28/2021  PLAN DEDUCTIBLE - $2750.00 W/ $168.00 REMAINING OUT OF POCKET - $6850.00 W/ $3678.00 REMAINING COPAY $0.00 COINSURANCE - 25% PER SERVICE YEAR COINS  SPOKE TO SUSAN AT [facility] STATED CPT 06301 DOES NOT REQUIRE AUTH. CALL REF # [id number] C 01/06/2022 10:04AM

## 2022-01-09 ENCOUNTER — Encounter: Payer: Self-pay | Admitting: Podiatry

## 2022-01-17 ENCOUNTER — Other Ambulatory Visit: Payer: Self-pay | Admitting: Primary Care

## 2022-01-17 DIAGNOSIS — F411 Generalized anxiety disorder: Secondary | ICD-10-CM

## 2022-01-18 DIAGNOSIS — M79676 Pain in unspecified toe(s): Secondary | ICD-10-CM

## 2022-01-18 NOTE — Telephone Encounter (Signed)
Patient due for general follow up/CPE. Can we get her scheduled?

## 2022-01-18 NOTE — Telephone Encounter (Signed)
Left message to return call to our office.  

## 2022-01-18 NOTE — Telephone Encounter (Signed)
Pt called back. She is having surgery 01-24-22. Says she will not be able to drive for a month. Scheduled CPE for 03-16-22.

## 2022-01-23 ENCOUNTER — Other Ambulatory Visit: Payer: Self-pay | Admitting: Family Medicine

## 2022-01-23 DIAGNOSIS — G40B09 Juvenile myoclonic epilepsy, not intractable, without status epilepticus: Secondary | ICD-10-CM

## 2022-01-24 ENCOUNTER — Other Ambulatory Visit: Payer: Self-pay | Admitting: Podiatry

## 2022-01-24 ENCOUNTER — Encounter: Payer: Self-pay | Admitting: Podiatry

## 2022-01-24 DIAGNOSIS — M722 Plantar fascial fibromatosis: Secondary | ICD-10-CM

## 2022-01-24 DIAGNOSIS — D1723 Benign lipomatous neoplasm of skin and subcutaneous tissue of right leg: Secondary | ICD-10-CM | POA: Diagnosis not present

## 2022-01-24 DIAGNOSIS — D492 Neoplasm of unspecified behavior of bone, soft tissue, and skin: Secondary | ICD-10-CM | POA: Diagnosis not present

## 2022-01-24 HISTORY — PX: FOOT SURGERY: SHX648

## 2022-01-24 MED ORDER — ONDANSETRON HCL 4 MG PO TABS: 4.0000 mg | ORAL_TABLET | Freq: Three times a day (TID) | ORAL | 0 refills | Status: DC | PRN

## 2022-01-24 MED ORDER — PHENOBARBITAL 97.2 MG PO TABS: 194.4000 mg | ORAL_TABLET | Freq: Every day | ORAL | 0 refills | Status: DC

## 2022-01-24 MED ORDER — OXYCODONE-ACETAMINOPHEN 10-325 MG PO TABS: 1.0000 | ORAL_TABLET | Freq: Three times a day (TID) | ORAL | 0 refills | Status: DC | PRN

## 2022-01-24 NOTE — Telephone Encounter (Signed)
Last OV was on 08/04/21.  Next OV is pending to be scheduled.  Last RX was written on 10/27/21 for 180 tabs.   [place] Drug Database has been reviewed.

## 2022-01-24 NOTE — Addendum Note (Signed)
Addended by: Wallene Huh on: 01/24/2022 07:01 AM   Modules accepted: Orders

## 2022-01-25 ENCOUNTER — Ambulatory Visit: Payer: BC Managed Care – PPO

## 2022-01-25 ENCOUNTER — Telehealth: Payer: Self-pay

## 2022-01-25 NOTE — Telephone Encounter (Signed)
Invalid Number

## 2022-01-30 ENCOUNTER — Encounter: Payer: BC Managed Care – PPO | Admitting: Podiatry

## 2022-02-01 ENCOUNTER — Encounter: Payer: Self-pay | Admitting: Podiatry

## 2022-02-01 ENCOUNTER — Ambulatory Visit (INDEPENDENT_AMBULATORY_CARE_PROVIDER_SITE_OTHER): Payer: BC Managed Care – PPO | Admitting: Podiatry

## 2022-02-01 DIAGNOSIS — M722 Plantar fascial fibromatosis: Secondary | ICD-10-CM

## 2022-02-13 ENCOUNTER — Encounter: Payer: Self-pay | Admitting: Podiatry

## 2022-02-13 ENCOUNTER — Ambulatory Visit (INDEPENDENT_AMBULATORY_CARE_PROVIDER_SITE_OTHER): Payer: BC Managed Care – PPO

## 2022-02-13 DIAGNOSIS — D2122 Benign neoplasm of connective and other soft tissue of left lower limb, including hip: Secondary | ICD-10-CM

## 2022-02-13 NOTE — Progress Notes (Signed)
Patient seen in office for suture removal of the right foot.   Patient denies nausea, vomiting, fever and chills at this visit.   Sutures removed from right foot without complication. Anklet compression sleeve applied to left foot with a surgical shoe.   Advised patient to gradually work her way into the surgical shoe over the next few days and transition into a supportive athletic shoe in about 2 weeks, using the air fracture walker boot as needed.  Patient verbalized understanding.

## 2022-02-13 NOTE — Patient Instructions (Signed)
You were given a surgical shoe at today's visit. You can gradual work your way into where the surgical shoe over the next few days. The air fracture walker boot can be used as needed. In 2 weeks you can began to transition into a supportive athletic shoe. Please call the office with any questions, concerns or comments.

## 2022-03-01 ENCOUNTER — Ambulatory Visit (INDEPENDENT_AMBULATORY_CARE_PROVIDER_SITE_OTHER): Payer: BC Managed Care – PPO

## 2022-03-01 DIAGNOSIS — E538 Deficiency of other specified B group vitamins: Secondary | ICD-10-CM

## 2022-03-01 MED ORDER — CYANOCOBALAMIN 1000 MCG/ML IJ SOLN
1000.0000 ug | Freq: Once | INTRAMUSCULAR | Status: AC
  Administered 2022-03-01: 1000 ug via INTRAMUSCULAR

## 2022-03-01 NOTE — Progress Notes (Signed)
Per orders of Allie Bossier, NP, monthly injection of B12 given by Loreen Freud. Patient tolerated injection well.

## 2022-03-16 ENCOUNTER — Ambulatory Visit (INDEPENDENT_AMBULATORY_CARE_PROVIDER_SITE_OTHER): Payer: BC Managed Care – PPO | Admitting: Primary Care

## 2022-03-16 ENCOUNTER — Encounter: Payer: Self-pay | Admitting: Primary Care

## 2022-03-16 VITALS — BP 110/76 | HR 98 | Temp 98.6°F | Ht 65.0 in | Wt 173.0 lb

## 2022-03-16 DIAGNOSIS — R519 Headache, unspecified: Secondary | ICD-10-CM | POA: Diagnosis not present

## 2022-03-16 DIAGNOSIS — E538 Deficiency of other specified B group vitamins: Secondary | ICD-10-CM | POA: Diagnosis not present

## 2022-03-16 DIAGNOSIS — F411 Generalized anxiety disorder: Secondary | ICD-10-CM | POA: Diagnosis not present

## 2022-03-16 DIAGNOSIS — E559 Vitamin D deficiency, unspecified: Secondary | ICD-10-CM | POA: Diagnosis not present

## 2022-03-16 DIAGNOSIS — L309 Dermatitis, unspecified: Secondary | ICD-10-CM

## 2022-03-16 DIAGNOSIS — E785 Hyperlipidemia, unspecified: Secondary | ICD-10-CM

## 2022-03-16 DIAGNOSIS — G40B09 Juvenile myoclonic epilepsy, not intractable, without status epilepticus: Secondary | ICD-10-CM

## 2022-03-16 DIAGNOSIS — Z0001 Encounter for general adult medical examination with abnormal findings: Secondary | ICD-10-CM | POA: Diagnosis not present

## 2022-03-16 DIAGNOSIS — G43009 Migraine without aura, not intractable, without status migrainosus: Secondary | ICD-10-CM | POA: Diagnosis not present

## 2022-03-16 DIAGNOSIS — K5901 Slow transit constipation: Secondary | ICD-10-CM

## 2022-03-16 DIAGNOSIS — J4520 Mild intermittent asthma, uncomplicated: Secondary | ICD-10-CM | POA: Diagnosis not present

## 2022-03-16 MED ORDER — TRIAMCINOLONE ACETONIDE 0.1 % EX CREA: 1.0000 | TOPICAL_CREAM | Freq: Two times a day (BID) | CUTANEOUS | 0 refills | Status: DC

## 2022-03-16 MED ORDER — PROPRANOLOL HCL ER 80 MG PO CP24: 80.0000 mg | ORAL_CAPSULE | Freq: Every day | ORAL | 0 refills | Status: DC

## 2022-03-16 NOTE — Assessment & Plan Note (Signed)
Controlled.  Continue Lexapro 20 mg daily. Continue to monitor.

## 2022-03-16 NOTE — Assessment & Plan Note (Addendum)
Uncontrolled.   She is taking Tylenol daily for treatment. Discussed options, she agrees to daily headache prevention.   Rx for propranolol ER 80 mg daily sent to pharmacy.  She will update in a few weeks.

## 2022-03-16 NOTE — Assessment & Plan Note (Addendum)
Uncontrolled.  Unfortunately, numerous maintenance inhalers are cost prohibitive.  Also albuterol has been cost prohibitive.  I instructed her to contact her insurance to find out what they will cover for maintenance and rescue inhalers.  She would benefit from both.  Thus far Symbicort, Proventil, Ventolin and Breo have been cost prohibitive.

## 2022-03-16 NOTE — Assessment & Plan Note (Signed)
No recent seizures.  Following with neurology.  Continue phenobarbital 194.4 mg daily.

## 2022-03-16 NOTE — Patient Instructions (Addendum)
Stop by the lab prior to leaving today I will notify you of your results once received.   Please follow back up with your GI doctor.  Please call your insurance company to find out which maintenance and rescue inhalers they will cover and are affordable for your asthma.  Start propanolol ER 80 mg capsules for headache prevention.  Take 1 capsule by mouth every day.  It was a pleasure to see you today!    Preventive Care 22-53 Years Old, Female Preventive care refers to lifestyle choices and visits with your health care provider that can promote health and wellness. Preventive care visits are also called wellness exams. What can I expect for my preventive care visit? Counseling Your health care provider may ask you questions about your: Medical history, including: Past medical problems. Family medical history. Pregnancy history. Current health, including: Menstrual cycle. Method of birth control. Emotional well-being. Home life and relationship well-being. Sexual activity and sexual health. Lifestyle, including: Alcohol, nicotine or tobacco, and drug use. Access to firearms. Diet, exercise, and sleep habits. Work and work Statistician. Sunscreen use. Safety issues such as seatbelt and bike helmet use. Physical exam Your health care provider will check your: Height and weight. These may be used to calculate your BMI (body mass index). BMI is a measurement that tells if you are at a healthy weight. Waist circumference. This measures the distance around your waistline. This measurement also tells if you are at a healthy weight and may help predict your risk of certain diseases, such as type 2 diabetes and high blood pressure. Heart rate and blood pressure. Body temperature. Skin for abnormal spots. What immunizations do I need?  Vaccines are usually given at various ages, according to a schedule. Your health care provider will recommend vaccines for you based on your age,  medical history, and lifestyle or other factors, such as travel or where you work. What tests do I need? Screening Your health care provider may recommend screening tests for certain conditions. This may include: Lipid and cholesterol levels. Diabetes screening. This is done by checking your blood sugar (glucose) after you have not eaten for a while (fasting). Pelvic exam and Pap test. Hepatitis B test. Hepatitis C test. HIV (human immunodeficiency virus) test. STI (sexually transmitted infection) testing, if you are at risk. Lung cancer screening. Colorectal cancer screening. Mammogram. Talk with your health care provider about when you should start having regular mammograms. This may depend on whether you have a family history of breast cancer. BRCA-related cancer screening. This may be done if you have a family history of breast, ovarian, tubal, or peritoneal cancers. Bone density scan. This is done to screen for osteoporosis. Talk with your health care provider about your test results, treatment options, and if necessary, the need for more tests. Follow these instructions at home: Eating and drinking  Eat a diet that includes fresh fruits and vegetables, whole grains, lean protein, and low-fat dairy products. Take vitamin and mineral supplements as recommended by your health care provider. Do not drink alcohol if: Your health care provider tells you not to drink. You are pregnant, may be pregnant, or are planning to become pregnant. If you drink alcohol: Limit how much you have to 0-1 drink a day. Know how much alcohol is in your drink. In the [place] one drink equals one 12 oz bottle of beer (355 mL), one 5 oz glass of wine (148 mL), or one 1 oz glass of hard liquor (44 mL). Lifestyle  Brush your teeth every morning and night with fluoride toothpaste. Floss one time each day. Exercise for at least 30 minutes 5 or more days each week. Do not use any products that contain nicotine or  tobacco. These products include cigarettes, chewing tobacco, and vaping devices, such as e-cigarettes. If you need help quitting, ask your health care provider. Do not use drugs. If you are sexually active, practice safe sex. Use a condom or other form of protection to prevent STIs. If you do not wish to become pregnant, use a form of birth control. If you plan to become pregnant, see your health care provider for a prepregnancy visit. Take aspirin only as told by your health care provider. Make sure that you understand how much to take and what form to take. Work with your health care provider to find out whether it is safe and beneficial for you to take aspirin daily. Find healthy ways to manage stress, such as: Meditation, yoga, or listening to music. Journaling. Talking to a trusted person. Spending time with friends and family. Minimize exposure to UV radiation to reduce your risk of skin cancer. Safety Always wear your seat belt while driving or riding in a vehicle. Do not drive: If you have been drinking alcohol. Do not ride with someone who has been drinking. When you are tired or distracted. While texting. If you have been using any mind-altering substances or drugs. Wear a helmet and other protective equipment during sports activities. If you have firearms in your house, make sure you follow all gun safety procedures. Seek help if you have been physically or sexually abused. What's next? Visit your health care provider once a year for an annual wellness visit. Ask your health care provider how often you should have your eyes and teeth checked. Stay up to date on all vaccines. This information is not intended to replace advice given to you by your health care provider. Make sure you discuss any questions you have with your health care provider. Document Revised: 02/09/2021 Document Reviewed: 02/09/2021 Elsevier Patient Education  [place]. y.

## 2022-03-16 NOTE — Assessment & Plan Note (Signed)
Out of vitamin D 50,000 IU capsules for several months. Repeat vitamin D level pending.

## 2022-03-16 NOTE — Assessment & Plan Note (Signed)
Repeat lipid panel pending. 

## 2022-03-16 NOTE — Progress Notes (Signed)
Subjective:    Patient ID: [name], female    DOB: Jan 29, 1971, 51 y.o.   MRN: [id number]  HPI  [name] is a very pleasant 51 y.o. female who presents today for complete physical and follow up of chronic conditions.  She would also like mention chronic, daily headaches. Chronic for years. Headaches occur most everyday and are located to the frontal lobes, parietal lobes, and a band like pressure around her head. She will take 4-6 pills of Tylenol most everyday for treatment with improvement. Overall migraines are infrequent. She denies nausea and photophobia with headaches.   She would also like to mention acute on chronic eczema flare.  History of intermittent eczema to the posterior neck for years.  Typically she uses over-the-counter topical cream, but recently this has not helped with her recent flare.  Immunizations: -Tetanus: 2021 -Influenza: Did not complete last season  -Covid-19: Never completed -Shingles: Never completed, declines    Diet: Fair diet.  Exercise: No regular exercise.  Eye exam: Completes annually  Dental exam: Completes semi-annually   Pap Smear: Hysterectomy   Mammogram: Completed in October 2022  Colonoscopy: Never completed, has been referred a few times.    BP Readings from Last 3 Encounters:  03/16/22 110/76  12/13/21 118/74  09/02/21 124/72       Review of Systems  Constitutional:  Negative for unexpected weight change.  HENT:  Negative for rhinorrhea.   Respiratory:  Negative for cough and shortness of breath.   Cardiovascular:  Negative for chest pain.  Gastrointestinal:  Positive for constipation. Negative for diarrhea.  Genitourinary:  Negative for difficulty urinating.  Musculoskeletal:  Positive for arthralgias.  Skin:  Negative for rash.  Allergic/Immunologic: Negative for environmental allergies.  Neurological:  Positive for headaches. Negative for dizziness.  Psychiatric/Behavioral:  The patient is  not nervous/anxious.          Past Medical History:  Diagnosis Date   Allergy    Anxiety    Asthma    worse when younger   Cellulitis of face 05/05/2021   GERD (gastroesophageal reflux disease)    Migraine    1-2x/month   Persistent cough for 3 weeks or longer 10/09/2019   PONV (postoperative nausea and vomiting)    Seizure ([place])    Vaginal itching 05/27/2020   Vertigo    no episodes for 7 yrs    Social History   Socioeconomic History   Marital status: Widowed    Spouse name: [name]   Number of children: 0   Years of education: 10th   Highest education level: Not on file  Occupational History    Employer: [id number]  Tobacco Use   Smoking status: Former    Packs/day: 1.00    Types: Cigarettes    Quit date: 08/28/2013    Years since quitting: 8.5   Smokeless tobacco: Never   Tobacco comments:    Quit Janurary 1st  Vaping Use   Vaping Use: Never used  Substance and Sexual Activity   Alcohol use: No    Alcohol/week: 0.0 standard drinks of alcohol   Drug use: No   Sexual activity: Not Currently    Birth control/protection: Surgical  Other Topics Concern   Not on file  Social History Narrative   Patient lives alone   No children.   Works at [employer].   Enjoys going to [employer], going to estate sells.   Right handed   Caffeine: 1 Dr. [name] all day long (5-6)  Social Determinants of Health   Financial Resource Strain: Not on file  Food Insecurity: Not on file  Transportation Needs: Not on file  Physical Activity: Not on file  Stress: Not on file  Social Connections: Not on file  Intimate Partner Violence: Not on file    Past Surgical History:  Procedure Laterality Date   ABDOMINAL HYSTERECTOMY  2012   COLONOSCOPY     COLONOSCOPY WITH PROPOFOL N/A 04/24/2019   Procedure: COLONOSCOPY WITH PROPOFOL;  Surgeon: Virgel Manifold, MD;  Location: [facility] ENDOSCOPY;  Service: Endoscopy;  Laterality: N/A;   ESOPHAGOGASTRODUODENOSCOPY (EGD) WITH PROPOFOL N/A  04/24/2019   Procedure: ESOPHAGOGASTRODUODENOSCOPY (EGD) WITH PROPOFOL;  Surgeon: Virgel Manifold, MD;  Location: [facility] ENDOSCOPY;  Service: Endoscopy;  Laterality: N/A;   FOOT SURGERY Right 01/24/2022   HAND SURGERY Right 05/07/2020   trigger release   UPPER GASTROINTESTINAL ENDOSCOPY      Family History  Problem Relation Age of Onset   Hypertension Mother    Colon cancer Neg Hx    Esophageal cancer Neg Hx    Stomach cancer Neg Hx    Rectal cancer Neg Hx     Allergies  Allergen Reactions   Asa Buff (Mag [Buffered Aspirin] Other (See Comments)    Stomach pain   Azithromycin Other (See Comments)    GI problems   Ibuprofen Other (See Comments)    GI upset   Penicillins Other (See Comments)    Unknown childhood allergic reaction Has patient had a PCN reaction causing immediate rash, facial/tongue/throat swelling, SOB or lightheadedness with hypotension: Unknown Has patient had a PCN reaction causing severe rash involving mucus membranes or skin necrosis: Unknown Has patient had a PCN reaction that required hospitalization: Unknown Has patient had a PCN reaction occurring within the last 10 years: No If all of the above answers are "NO", then may proceed with Cephalosporin use.    Current Outpatient Medications on File Prior to Visit  Medication Sig Dispense Refill   escitalopram (LEXAPRO) 20 MG tablet TAKE 1 TABLET BY MOUTH ONCE DAILY FOR ANXIETY 90 tablet 0   ondansetron (ZOFRAN) 4 MG tablet Take 1 tablet (4 mg total) by mouth every 8 (eight) hours as needed for nausea or vomiting. 10 tablet 0   PHENobarbital (LUMINAL) 97.2 MG tablet Take 2 tablets (194.4 mg total) by mouth daily. 180 tablet 0   rizatriptan (MAXALT-MLT) 10 MG disintegrating tablet Take 1 tablet (10 mg total) by mouth as needed for migraine. May repeat in 2 hours if needed (Patient taking differently: Take 10 mg by mouth daily as needed for migraine. May repeat in 2 hours if needed) 9 tablet 5   No current  facility-administered medications on file prior to visit.    BP 110/76   Pulse 98   Temp 98.6 F (37 C) (Oral)   Ht '5\' 5"'$  (1.651 m)   Wt 173 lb (78.5 kg)   SpO2 96%   BMI 28.79 kg/m  Objective:   Physical Exam HENT:     Right Ear: Tympanic membrane and ear canal normal.     Left Ear: Tympanic membrane and ear canal normal.     Nose: Nose normal.  Eyes:     Conjunctiva/sclera: Conjunctivae normal.     Pupils: Pupils are equal, round, and reactive to light.  Neck:     Thyroid: No thyromegaly.  Cardiovascular:     Rate and Rhythm: Normal rate and regular rhythm.     Heart sounds: No murmur heard.  Pulmonary:     Effort: Pulmonary effort is normal.     Breath sounds: Normal breath sounds. No rales.  Abdominal:     General: Bowel sounds are normal.     Palpations: Abdomen is soft.     Tenderness: There is no abdominal tenderness.  Musculoskeletal:        General: Normal range of motion.     Cervical back: Neck supple.  Lymphadenopathy:     Cervical: No cervical adenopathy.  Skin:    General: Skin is warm and dry.     Findings: No rash.  Neurological:     Mental Status: She is alert and oriented to person, place, and time.     Cranial Nerves: No cranial nerve deficit.     Deep Tendon Reflexes: Reflexes are normal and symmetric.  Psychiatric:        Mood and Affect: Mood normal.           Assessment & Plan:   Problem List Items Addressed This Visit       Cardiovascular and Mediastinum   Migraine without aura and without status migrainosus, not intractable    Stable, no concerns today.  Continue Maxalt 10 mg PRN.       Relevant Medications   propranolol ER (INDERAL LA) 80 MG 24 hr capsule     Respiratory   Asthma    Uncontrolled.  Unfortunately, numerous maintenance inhalers are cost prohibitive.  Also albuterol has been cost prohibitive.  I instructed her to contact her insurance to find out what they will cover for maintenance and rescue inhalers.   She would benefit from both.  Thus far Symbicort, Proventil, Ventolin and Breo have been cost prohibitive.        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  Following with neurology.  Continue phenobarbital 194.4 mg daily.        Musculoskeletal and Integument   Eczema    Chronic, intermittent.  Prescription provided for triamcinolone 0.1% cream to use twice daily as needed.      Relevant Medications   triamcinolone cream (KENALOG) 0.1 %     Other   GAD (generalized anxiety disorder)    Controlled.  Continue Lexapro 20 mg daily. Continue to monitor.       Constipation    Uncontrolled, no recent follow-up with GI. Recommended she follow back up with GI to attempt colonoscopy again.      Vitamin B 12 deficiency    Continue monthly B12 injections.  Repeat B12 level pending.      Relevant Orders   Vitamin B12   Vitamin D deficiency    Out of vitamin D 50,000 IU capsules for several months. Repeat vitamin D level pending.      Relevant Orders   VITAMIN D 25 Hydroxy (Vit-D Deficiency, Fractures)   Frequent headaches    Uncontrolled.   She is taking Tylenol daily for treatment. Discussed options, she agrees to daily headache prevention.   Rx for propranolol ER 80 mg daily sent to pharmacy.  She will update in a few weeks.       Relevant Medications   propranolol ER (INDERAL LA) 80 MG 24 hr capsule   Encounter for annual general medical examination with abnormal findings in adult - Primary    Declines Shingrix vaccines. Mammogram up-to-date. Colonoscopy overdue, she will follow back up with GI.  Discussed the importance of a healthy  diet and regular exercise in order for weight loss, and to reduce the risk of further co-morbidity.  Exam stable. Labs pending.  Follow up in 1 year for repeat physical.       Hyperlipidemia    Repeat lipid panel pending.         Relevant Medications   propranolol ER (INDERAL LA) 80 MG 24 hr capsule   Other Relevant Orders   Lipid panel   Hemoglobin A1c   Comprehensive metabolic panel   CBC       Pleas Koch, NP

## 2022-03-16 NOTE — Assessment & Plan Note (Signed)
Stable, no concerns today.  Continue Maxalt 10 mg PRN.

## 2022-03-16 NOTE — Assessment & Plan Note (Signed)
Uncontrolled, no recent follow-up with GI. Recommended she follow back up with GI to attempt colonoscopy again.

## 2022-03-16 NOTE — Assessment & Plan Note (Signed)
Continue monthly B12 injections.  Repeat B12 level pending.

## 2022-03-16 NOTE — Assessment & Plan Note (Signed)
Chronic, intermittent.  Prescription provided for triamcinolone 0.1% cream to use twice daily as needed.

## 2022-03-16 NOTE — Assessment & Plan Note (Signed)
Declines Shingrix vaccines. Mammogram up-to-date. Colonoscopy overdue, she will follow back up with GI.  Discussed the importance of a healthy diet and regular exercise in order for weight loss, and to reduce the risk of further co-morbidity.  Exam stable. Labs pending.  Follow up in 1 year for repeat physical.

## 2022-03-17 LAB — LIPID PANEL
Cholesterol: 260 mg/dL — ABNORMAL HIGH (ref 0–200)
HDL: 55.6 mg/dL (ref >39.00)
LDL Cholesterol: 174 mg/dL — ABNORMAL HIGH (ref 0–99)
NonHDL: 204.06
Total CHOL/HDL Ratio: 5
Triglycerides: 151 mg/dL — ABNORMAL HIGH (ref 0.0–149.0)
VLDL: 30.2 mg/dL (ref 0.0–40.0)

## 2022-03-17 LAB — CBC
HCT: 41.4 % (ref 36.0–46.0)
Hemoglobin: 13.9 g/dL (ref 12.0–15.0)
MCHC: 33.5 g/dL (ref 30.0–36.0)
MCV: 93.3 fl (ref 78.0–100.0)
Platelets: 212 10*3/uL (ref 150.0–400.0)
RBC: 4.44 Mil/uL (ref 3.87–5.11)
RDW: 13.3 % (ref 11.5–15.5)
WBC: 7.5 10*3/uL (ref 4.0–10.5)

## 2022-03-17 LAB — COMPREHENSIVE METABOLIC PANEL
ALT: 13 U/L (ref 0–35)
AST: 13 U/L (ref 0–37)
Albumin: 4.5 g/dL (ref 3.5–5.2)
Alkaline Phosphatase: 87 U/L (ref 39–117)
BUN: 11 mg/dL (ref 6–23)
CO2: 27 mEq/L (ref 19–32)
Calcium: 9.2 mg/dL (ref 8.4–10.5)
Chloride: 103 mEq/L (ref 96–112)
Creatinine, Ser: 0.8 mg/dL (ref 0.40–1.20)
GFR: 84.53 mL/min (ref >60.00)
Glucose, Bld: 94 mg/dL (ref 70–99)
Potassium: 4.3 mEq/L (ref 3.5–5.1)
Sodium: 139 mEq/L (ref 135–145)
Total Bilirubin: 0.3 mg/dL (ref 0.2–1.2)
Total Protein: 7.4 g/dL (ref 6.0–8.3)

## 2022-03-17 LAB — HEMOGLOBIN A1C: Hgb A1c MFr Bld: 5.5 % (ref 4.6–6.5)

## 2022-03-17 LAB — VITAMIN B12: Vitamin B-12: 437 pg/mL (ref 211–911)

## 2022-03-17 LAB — VITAMIN D 25 HYDROXY (VIT D DEFICIENCY, FRACTURES): VITD: 26.26 ng/mL — ABNORMAL LOW (ref 30.00–100.00)

## 2022-03-20 ENCOUNTER — Encounter: Payer: Self-pay | Admitting: Podiatry

## 2022-04-02 NOTE — Progress Notes (Signed)
Subjective:   Patient ID: [name], female   DOB: 51 y.o.   MRN: [id number]   HPI Patient states doing very well with surgery   ROS      Objective:  Physical Exam  Neurovascular status intact negative Bevelyn Buckles' sign noted plantar incision site healing well wound edges well coapted minimal edema noted     Assessment:  Doing well post plantar fibroma right     Plan:  Advised on continued compression elevation and immobilization with boot and nonweightbearing for several more weeks and reapplied sterile dressing to the left foot

## 2022-04-04 ENCOUNTER — Ambulatory Visit (INDEPENDENT_AMBULATORY_CARE_PROVIDER_SITE_OTHER): Payer: BC Managed Care – PPO

## 2022-04-04 DIAGNOSIS — E538 Deficiency of other specified B group vitamins: Secondary | ICD-10-CM | POA: Diagnosis not present

## 2022-04-04 MED ORDER — CYANOCOBALAMIN 1000 MCG/ML IJ SOLN
1000.0000 ug | Freq: Once | INTRAMUSCULAR | Status: AC
  Administered 2022-04-04: 1000 ug via INTRAMUSCULAR

## 2022-04-04 NOTE — Progress Notes (Addendum)
Per orders of Alma Friendly, NP injection of monthly B12 given by Kris Mouton. Patient tolerated injection well.

## 2022-04-12 ENCOUNTER — Other Ambulatory Visit: Payer: Self-pay | Admitting: Primary Care

## 2022-04-12 DIAGNOSIS — F411 Generalized anxiety disorder: Secondary | ICD-10-CM

## 2022-04-26 ENCOUNTER — Other Ambulatory Visit: Payer: Self-pay | Admitting: Diagnostic Neuroimaging

## 2022-04-26 ENCOUNTER — Telehealth: Payer: Self-pay | Admitting: Family Medicine

## 2022-04-26 DIAGNOSIS — G40B09 Juvenile myoclonic epilepsy, not intractable, without status epilepticus: Secondary | ICD-10-CM

## 2022-04-27 MED ORDER — PHENOBARBITAL 97.2 MG PO TABS: 194.4000 mg | ORAL_TABLET | Freq: Every day | ORAL | 0 refills | Status: DC

## 2022-05-02 ENCOUNTER — Other Ambulatory Visit: Payer: Self-pay | Admitting: Primary Care

## 2022-05-02 DIAGNOSIS — Z1231 Encounter for screening mammogram for malignant neoplasm of breast: Secondary | ICD-10-CM

## 2022-05-09 ENCOUNTER — Ambulatory Visit: Payer: BC Managed Care – PPO

## 2022-05-12 ENCOUNTER — Ambulatory Visit: Payer: BC Managed Care – PPO

## 2022-05-17 ENCOUNTER — Ambulatory Visit (INDEPENDENT_AMBULATORY_CARE_PROVIDER_SITE_OTHER): Payer: BC Managed Care – PPO

## 2022-05-17 DIAGNOSIS — E538 Deficiency of other specified B group vitamins: Secondary | ICD-10-CM

## 2022-05-17 MED ORDER — CYANOCOBALAMIN 1000 MCG/ML IJ SOLN
1000.0000 ug | Freq: Once | INTRAMUSCULAR | Status: AC
  Administered 2022-05-17: 1000 ug via INTRAMUSCULAR

## 2022-05-17 NOTE — Progress Notes (Signed)
Per orders of Alma Friendly, injection of monthly B12 given by Kris Mouton. Patient tolerated injection well.

## 2022-05-26 ENCOUNTER — Ambulatory Visit: Payer: BC Managed Care – PPO

## 2022-06-08 ENCOUNTER — Ambulatory Visit
Admission: RE | Admit: 2022-06-08 | Discharge: 2022-06-08 | Disposition: A | Discharge status: Home / Self Care | Payer: BC Managed Care – PPO | Source: Ambulatory Visit | Attending: Primary Care | Admitting: Primary Care

## 2022-06-08 DIAGNOSIS — Z1231 Encounter for screening mammogram for malignant neoplasm of breast: Secondary | ICD-10-CM

## 2022-06-14 ENCOUNTER — Encounter: Payer: Self-pay | Admitting: Podiatry

## 2022-06-14 ENCOUNTER — Ambulatory Visit: Payer: BC Managed Care – PPO | Admitting: Podiatry

## 2022-06-14 DIAGNOSIS — L600 Ingrowing nail: Secondary | ICD-10-CM | POA: Diagnosis not present

## 2022-06-14 DIAGNOSIS — M7751 Other enthesopathy of right foot: Secondary | ICD-10-CM

## 2022-06-14 MED ORDER — TRIAMCINOLONE ACETONIDE 10 MG/ML IJ SUSP
10.0000 mg | Freq: Once | INTRAMUSCULAR | Status: AC
  Administered 2022-06-14: 10 mg

## 2022-06-15 NOTE — Progress Notes (Signed)
Subjective:   Patient ID: [name], female   DOB: 51 y.o.   MRN: [id number]   HPI Patient presents stating the right hallux nail has become very damaged and is growing abnormally and needs to be removed permanently.  Also getting pain on the outside of the right foot that has become inflamed and making it hard to wear shoe doing well with surgery   ROS      Objective:  Physical Exam  Neurovascular status intact with patient found to have severely damaged right over left hallux nailbed with inflammation fluid around the fifth metatarsal head right and well-healed surgical site plantar aspect right     Assessment:  Damaged right hallux nail painful when pressed with inflammation fluid around the fifth MPJ right foot      Plan:  H&P reviewed conditions recommended nail removal explained procedure risk and that this will be permanent.  Patient signed consent form after review and at this time I infiltrated the right hallux 60 mg like Marcaine mixture sterile prep done using sterile instrumentation removed the right hallux exposed matrix applied phenol 5 applications 30 seconds followed by alcohol lavage sterile dressing gave instructions on soaks and to wear dressing for the next 24 hours and take off earlier if throbbing were to occur.  Sterile prep and injected around the fifth MPJ right 3 mg dexamethasone Kenalog 5 mg Xylocaine and we will see back may ultimately require surgery on this

## 2022-06-20 ENCOUNTER — Ambulatory Visit: Payer: BC Managed Care – PPO

## 2022-07-05 ENCOUNTER — Other Ambulatory Visit: Payer: Self-pay | Admitting: Diagnostic Neuroimaging

## 2022-07-05 ENCOUNTER — Other Ambulatory Visit: Payer: Self-pay | Admitting: Podiatry

## 2022-07-05 DIAGNOSIS — G43009 Migraine without aura, not intractable, without status migrainosus: Secondary | ICD-10-CM

## 2022-07-13 DIAGNOSIS — E538 Deficiency of other specified B group vitamins: Secondary | ICD-10-CM

## 2022-07-13 MED ORDER — CYANOCOBALAMIN 1000 MCG/ML IJ SOLN
1000.0000 ug | Freq: Once | INTRAMUSCULAR | Status: AC
  Administered 2022-07-13: 1000 ug via INTRAMUSCULAR

## 2022-07-13 NOTE — Progress Notes (Signed)
Per orders of Allie Bossier, DPN AGNP-C, injection of B-12 given by Francella Solian in left deltoid. Patient tolerated injection well. Patient will make appointment for 1 month.

## 2022-07-14 ENCOUNTER — Ambulatory Visit: Payer: BC Managed Care – PPO | Admitting: Primary Care

## 2022-07-24 ENCOUNTER — Other Ambulatory Visit: Payer: Self-pay | Admitting: Family Medicine

## 2022-07-24 DIAGNOSIS — G40B09 Juvenile myoclonic epilepsy, not intractable, without status epilepticus: Secondary | ICD-10-CM

## 2022-07-25 ENCOUNTER — Other Ambulatory Visit: Payer: Self-pay | Admitting: Family Medicine

## 2022-07-25 DIAGNOSIS — G40B09 Juvenile myoclonic epilepsy, not intractable, without status epilepticus: Secondary | ICD-10-CM

## 2022-07-25 MED ORDER — PHENOBARBITAL 97.2 MG PO TABS: 194.4000 mg | ORAL_TABLET | Freq: Every day | ORAL | 0 refills | Status: DC

## 2022-07-25 NOTE — Telephone Encounter (Signed)
Pt is scheduled for follow up with Amy on 08/16/22 at 11:30am

## 2022-07-25 NOTE — Telephone Encounter (Signed)
Patient is due for a refill on phenobarbital. Patient has an upcoming appointment next month. [place] Controlled Substance Registry checked and is appropriate.

## 2022-07-25 NOTE — Telephone Encounter (Signed)
Please call patient and schedule her an appointment so that we can refill her phenobarbital. Please let POD 1 know when this has been completed.

## 2022-07-25 NOTE — Telephone Encounter (Signed)
Please call patient to reschedule her 12/27 appointment and let POD 1 know when this has been completed so we can refill her phenobarbital.

## 2022-08-14 NOTE — Patient Instructions (Signed)
Below is our plan:  We will continue phenobarbital as prescribed. I will update labs. Discuss option for an oral appliance with your dentist. Consider weight management. Dr Dalbert Garnet with [facility] could be an option. Try amitriptyline for headaches per PCP direction.   Please continue using your CPAP regularly. While your insurance requires that you use CPAP at least 4 hours each night on 70% of the nights, I recommend, that you not skip any nights and use it throughout the night if you can. Getting used to CPAP and staying with the treatment long term does take time and patience and discipline. Untreated obstructive sleep apnea when it is moderate to severe can have an adverse impact on cardiovascular health and raise her risk for heart disease, arrhythmias, hypertension, congestive heart failure, stroke and diabetes. Untreated obstructive sleep apnea causes sleep disruption, nonrestorative sleep, and sleep deprivation. This can have an impact on your day to day functioning and cause daytime sleepiness and impairment of cognitive function, memory loss, mood disturbance, and problems focussing. Using CPAP regularly can improve these symptoms.  Please make sure you are staying well hydrated. I recommend 50-60 ounces daily. Well balanced diet and regular exercise encouraged. Consistent sleep schedule with 6-8 hours recommended.   Please continue follow up with care team as directed.   Follow up with me in 6 months   You may receive a survey regarding today's visit. I encourage you to leave honest feed back as I do use this information to improve patient care. Thank you for seeing me today!

## 2022-08-14 NOTE — Progress Notes (Unsigned)
No chief complaint on file.   HISTORY OF PRESENT ILLNESS:  08/14/22 ALL:  [name] returns for follow up for headaches, seizures and OSA. She was last seen by me 02/2021 and reported worsening headaches. Sleep referral placed and HST 07/2021 showed moderate OSA with total AHI 15.4/h, REM AHI 29.6/h. CPAP advised. ... She has continued propranolol LA '80mg'$  daily and rizatriptan as needed.   She continues phenobarbital 194.'4mg'$  daily for seizure management.   08/04/2021 ALL: [name] is a 51 y.o. female here today for follow up for headaches and seizures. She continues phenobarbital 2 tablets daily. No recent seizures. She continues to have daily headaches. Most occur in the mornings or when she wakes from sleep. She was seen by PCP for excessive daytime sleepiness. She was missing work due to not being able to wake up. She is taking Tylenol daily, usually about 8 - '500mg'$  tablets. She has about 1 migraine per month. Rizatriptan usually aborts migraine. She was told by her late husband that she snores. She denies family history of sleep apnea. She was seen by Dr Rexene Alberts in 2017 and sleep study advised but she did not pursue.    HISTORY (copied from Dr Gladstone Lighter previous note)  UPDATE (02/09/20, VRP): Since last visit, doing about the same. Daily low grade HA, but rare migraine. No longer on ajovy (hard to take shots on her own). Symptoms are stable. No seizures.   PRIOR HPI (12/25/18): - doing well; no seizures - HA are improving on ajovy - unfortunately patient's husband deceased now (from cancer) - overall stable   UPDATE (12/31/17, VRP): Since last visit, doing the same with HA (daily basis). Tried TPX, but HA worse. Rizatriptan helps. Tried lower PB, but had muscle spasms / myoclonus, but no grand mal seizures.    UPDATE (09/02/16, VRP): Since last visit, doing well. No seizures. Tolerating meds. No alleviating or aggravating factors. In last 3 weeks has had more HA / migraines  (daily). Rizatriptan and tylenol not helping that much. Had cut down tylenol for a while, but now back up to 6 tabs per day. Drinking 1 soda per day. Eating 3 times per day. Sleep is a little better.   UPDATE (09/02/16, VRP): Since last visit, doing well --> no seizures. Tolerating meds. No alleviating or aggravating factors. Has had 3 weeks of consistent headaches. Taking up to tylenol x 6 tabs per day.   UPDATE 06/26/16: Since last visit, doing well. No seizures. Sleep is improved on her own. Tried on amitriptyline, but HA felt worse, so she stopped it. Having 2-3 migraine per month, well controlled with rizatriptan.   UPDATE 12/24/15: Since last visit, no seizures. Still with daily headaches. Also with 1 severe migraine per month. Eating 2 x per day. Soda (1 per day). Still with 6 tylenol tabs per day. Amitriptyline helped some in the beginning, but seems to be less effective now.   UPDATE 10/22/15: Since last visit, no seizures. Has cut down soda (now 1-2 per day), has quit smoking, and now exercising some. Still only eats 1 meal per day. Still taking 5-9 tabs tylenol on daily basis. Has 1 migraine per month (pounding global HA, sens to light / sound). Also with daily lower level headaches. Still poor sleep quality.   UPDATE 10/21/14: Since last visit, no seizures. C/o daily headaches, and taking daily Tylenol. She has interrupted sleep at nighttime. She continues to drink 5-6 Dr. [name] cans per day. She eats once per day.  She has increased stress related to work. Fortunately patient has been able to quit smoking cigarettes since 08/28/2014.   UPDATE 10/21/13: Since last visit, no new events or seizures. Now has [facility], but no PCP. Tolerating PB 97.'2mg'$  tabs (2 tabs qAM).   UPDATE 05/29/12: No seizure. No PCP. No insurance.   UPDATE 12/22/10: No seizure since age 34's.  Tolerating PB, and wants to stay on it.  Having 1 migraine every 1-2 years (global, severe, pressure, photophobia, phonophobia).   Usually midrin helps.  Never tried triptan.  Also using tylenol for tension HA.  Last migraine Aug 2011.   PRIOR HPI (12/15/08, Dr. Gaynell Face): "[name] is a 51 year old married woman with a long-standing history of juvenile myoclonic epilepsy.  She has not had seizures for years.  The only manifestation of her symptoms has been generalized tonic-clonic seizures.  She has taken and tolerated Phenobarbital without significant side effects. Patient has daily headaches that I think are more tension type in nature than migraine.  Become on later in the day except when she has stress. Since her last visit, she had hysterectomy and oophorectomy for removal of an ovarian cyst.  She was noted to have scarred fallopian tubes at the time of operation."   REVIEW OF SYSTEMS: Out of a complete 14 system review of symptoms, the patient complains only of the following symptoms, headaches, anxiety and all other reviewed systems are negative.   ALLERGIES: Allergies  Allergen Reactions   Asa Buff (Mag [Buffered Aspirin] Other (See Comments)    Stomach pain   Aspirin Other (See Comments)   Azithromycin Other (See Comments)    GI problems   Ibuprofen Other (See Comments)    GI upset   Penicillins Other (See Comments)    Unknown childhood allergic reaction Has patient had a PCN reaction causing immediate rash, facial/tongue/throat swelling, SOB or lightheadedness with hypotension: Unknown Has patient had a PCN reaction causing severe rash involving mucus membranes or skin necrosis: Unknown Has patient had a PCN reaction that required hospitalization: Unknown Has patient had a PCN reaction occurring within the last 10 years: No If all of the above answers are "NO", then may proceed with Cephalosporin use.     HOME MEDICATIONS: Outpatient Medications Prior to Visit  Medication Sig Dispense Refill   escitalopram (LEXAPRO) 20 MG tablet TAKE 1 TABLET BY MOUTH ONCE DAILY FOR ANXIETY 90 tablet 3   ondansetron  (ZOFRAN) 4 MG tablet Take 1 tablet (4 mg total) by mouth every 8 (eight) hours as needed for nausea or vomiting. 10 tablet 0   PHENobarbital (LUMINAL) 97.2 MG tablet Take 2 tablets (194.4 mg total) by mouth daily. 180 tablet 0   propranolol ER (INDERAL LA) 80 MG 24 hr capsule Take 1 capsule (80 mg total) by mouth daily. For headache prevention 30 capsule 0   rizatriptan (MAXALT-MLT) 10 MG disintegrating tablet DISSOLVE 1 TABLET IN MOUTH ONCE DAILY AS NEEDED FOR MIGRAINE. MAY REPEAT IN 2 HOURS IF NEEDED 9 tablet 0   triamcinolone cream (KENALOG) 0.1 % Apply 1 Application topically 2 (two) times daily. 30 g 0   No facility-administered medications prior to visit.     PAST MEDICAL HISTORY: Past Medical History:  Diagnosis Date   Allergy    Anxiety    Asthma    worse when younger   Cellulitis of face 05/05/2021   GERD (gastroesophageal reflux disease)    Migraine    1-2x/month   Persistent cough for 3 weeks or longer  10/09/2019   PONV (postoperative nausea and vomiting)    Seizure ([place])    Vaginal itching 05/27/2020   Vertigo    no episodes for 7 yrs     PAST SURGICAL HISTORY: Past Surgical History:  Procedure Laterality Date   ABDOMINAL HYSTERECTOMY  2012   COLONOSCOPY     COLONOSCOPY WITH PROPOFOL N/A 04/24/2019   Procedure: COLONOSCOPY WITH PROPOFOL;  Surgeon: Virgel Manifold, MD;  Location: [facility];  Service: Endoscopy;  Laterality: N/A;   ESOPHAGOGASTRODUODENOSCOPY (EGD) WITH PROPOFOL N/A 04/24/2019   Procedure: ESOPHAGOGASTRODUODENOSCOPY (EGD) WITH PROPOFOL;  Surgeon: Virgel Manifold, MD;  Location: [facility] END[facility]COPY;  Service: Endoscopy;  Laterality: N/A;   FOOT SURGERY Right 01/24/2022   HAND SURGERY Right 05/07/2020   trigger release   UPPER GASTROINTESTINAL ENDOSCOPY       FAMILY HISTORY: Family History  Problem Relation Age of Onset   Hypertension Mother    Colon cancer Neg Hx    Esophageal cancer Neg Hx    Stomach cancer Neg Hx    Rectal cancer  Neg Hx      SOCIAL HISTORY: Social History   Socioeconomic History   Marital status: Widowed    Spouse name: [name]   Number of children: 0   Years of education: 10th   Highest education level: Not on file  Occupational History    Employer: [employer]  Tobacco Use   Smoking status: Former    Packs/day: 1.00    Types: Cigarettes    Quit date: 08/28/2013    Years since quitting: 8.9   Smokeless tobacco: Never   Tobacco comments:    Quit Janurary 1st  Vaping Use   Vaping Use: Never used  Substance and Sexual Activity   Alcohol use: No    Alcohol/week: 0.0 standard drinks of alcohol   Drug use: No   Sexual activity: Not Currently    Birth control/protection: Surgical  Other Topics Concern   Not on file  Social History Narrative   Patient lives alone   No children.   Works at [employer].   Enjoys going to [employer], going to estate sells.   Right handed   Caffeine: 1 Dr. [name] all day long (5-6)   Social Determinants of Health   Financial Resource Strain: Not on file  Food Insecurity: Not on file  Transportation Needs: Not on file  Physical Activity: Not on file  Stress: Not on file  Social Connections: Not on file  Intimate Partner Violence: Not on file     PHYSICAL EXAM  There were no vitals filed for this visit.  There is no height or weight on file to calculate BMI.   Generalized: Well developed, in no acute distress  Cardiology: normal rate and rhythm, no murmur auscultated  Respiratory: clear to auscultation bilaterally    Neurological examination  Mentation: Alert oriented to time, place, history taking. Follows all commands speech and language fluent Cranial nerve II-XII: Pupils were equal round reactive to light. Extraocular movements were full, visual field were full on confrontational test. Facial sensation and strength were normal. Uvula tongue midline. Head turning and shoulder shrug  were normal and symmetric. Motor: The motor testing reveals 5  over 5 strength of all 4 extremities. Good symmetric motor tone is noted throughout.  Sensory: Sensory testing is intact to soft touch on all 4 extremities. No evidence of extinction is noted.  Coordination: Cerebellar testing reveals good finger-nose-finger and heel-to-shin bilaterally.  Gait and station: Gait is normal. Tandem  gait is normal. Romberg is negative. No drift is seen.  Reflexes: Deep tendon reflexes are symmetric and normal bilaterally.    DIAGNOSTIC DATA (LABS, IMAGING, TESTING) - I reviewed patient records, labs, notes, testing and imaging myself where available.  Lab Results  Component Value Date   WBC 7.5 03/16/2022   HGB 13.9 03/16/2022   HCT 41.4 03/16/2022   MCV 93.3 03/16/2022   PLT 212.0 03/16/2022      Component Value Date/Time   NA 139 03/16/2022 1434   NA 140 10/22/2015 1206   K 4.3 03/16/2022 1434   CL 103 03/16/2022 1434   CO2 27 03/16/2022 1434   GLUCOSE 94 03/16/2022 1434   BUN 11 03/16/2022 1434   BUN 9 10/22/2015 1206   CREATININE 0.80 03/16/2022 1434   CREATININE 0.63 11/30/2017 1549   CALCIUM 9.2 03/16/2022 1434   PROT 7.4 03/16/2022 1434   PROT 6.5 10/22/2015 1206   ALBUMIN 4.5 03/16/2022 1434   ALBUMIN 4.1 10/22/2015 1206   AST 13 03/16/2022 1434   ALT 13 03/16/2022 1434   ALKPHOS 87 03/16/2022 1434   BILITOT 0.3 03/16/2022 1434   BILITOT 0.2 10/22/2015 1206   GFRNONAA >60 08/31/2021 0610   GFRAA >60 01/08/2020 2145   Lab Results  Component Value Date   CHOL 260 (H) 03/16/2022   HDL 55.60 03/16/2022   LDLCALC 174 (H) 03/16/2022   LDLDIRECT 137.0 10/27/2020   TRIG 151.0 (H) 03/16/2022   CHOLHDL 5 03/16/2022   Lab Results  Component Value Date   HGBA1C 5.5 03/16/2022   Lab Results  Component Value Date   VITAMINB12 437 03/16/2022   Lab Results  Component Value Date   TSH 1.44 02/18/2021        No data to display               No data to display           ASSESSMENT AND PLAN  51 y.o. year old female   has a past medical history of Allergy, Anxiety, Asthma, Cellulitis of face (05/05/2021), GERD (gastroesophageal reflux disease), Migraine, Persistent cough for 3 weeks or longer (10/09/2019), PONV (postoperative nausea and vomiting), Seizure ([place]), Vaginal itching (05/27/2020), and Vertigo. here with    No diagnosis found.  Symphany is doing well from a seizure perspective. We will update labs and continue phenobarbital 194.'4mg'$  daily. She was encouraged to consider sleep study for evaluation for possible sleep apnea. She is in agreement and referral placed, today. We have reviewed concerns of possible analgesic rebound headaches. She was encouraged to wean OTC analgesic use. Complementary therapies reviewed for headache management. Healthy lifestyle habits encouraged. She will follow up with me pending sleep study results. She verbalizes understanding and agreement with this plan.    No orders of the defined types were placed in this encounter.    No orders of the defined types were placed in this encounter.     Debbora Presto, MSN, FNP-C 08/14/2022, 2:02 PM  [facility] [address], [place], [place] [address] [phone]

## 2022-08-15 ENCOUNTER — Ambulatory Visit: Payer: BC Managed Care – PPO | Admitting: Primary Care

## 2022-08-15 ENCOUNTER — Encounter: Payer: Self-pay | Admitting: Primary Care

## 2022-08-15 VITALS — BP 122/84 | HR 81 | Temp 98.1°F | Ht 65.0 in | Wt 182.0 lb

## 2022-08-15 DIAGNOSIS — E785 Hyperlipidemia, unspecified: Secondary | ICD-10-CM

## 2022-08-15 DIAGNOSIS — E538 Deficiency of other specified B group vitamins: Secondary | ICD-10-CM | POA: Diagnosis not present

## 2022-08-15 DIAGNOSIS — G43009 Migraine without aura, not intractable, without status migrainosus: Secondary | ICD-10-CM | POA: Diagnosis not present

## 2022-08-15 DIAGNOSIS — R519 Headache, unspecified: Secondary | ICD-10-CM | POA: Diagnosis not present

## 2022-08-15 MED ORDER — AMITRIPTYLINE HCL 25 MG PO TABS: 25.0000 mg | ORAL_TABLET | Freq: Every day | ORAL | 0 refills | Status: DC

## 2022-08-15 MED ORDER — CYANOCOBALAMIN 1000 MCG/ML IJ SOLN
1000.0000 ug | Freq: Once | INTRAMUSCULAR | Status: AC
  Administered 2022-08-15: 1000 ug via INTRAMUSCULAR

## 2022-08-15 MED ORDER — ONDANSETRON HCL 4 MG PO TABS: 4.0000 mg | ORAL_TABLET | Freq: Three times a day (TID) | ORAL | 0 refills | Status: DC | PRN

## 2022-08-15 NOTE — Progress Notes (Signed)
Subjective:    Patient ID: [name], female    DOB: 02/28/1971, 51 y.o.   MRN: [id number]  HPI  [name] is a very pleasant 51 y.o. female with a history of vitamin B12 deficiency, low vitamin D, migraines, seizure disorder, GAD, asthma who presents today   1) Vitamin B12 Deficiency: Currently managed on vitamin B12 1000 mcg once monthly.  Her last B12 lab in July 2023 revealed a level of 437, improved from 262 six months prior.  2) Hyperlipidemia: Currently not managed on treatment.  Last lipid panel was completed in July 2023 with LDL of 174, increased from 137 in March 2022.  After her visit in July 2023 was recommended to repeat her cholesterol level 4 to 6 months later after efforts of dietary changes and exercise.  Since her last visit she is not exercising. She mostly eats at home. Over the last 2 weeks she's been eating cereal. She has been working to cut back on fried foods. She drinks little water, mostly drinks soda.   She has a family history of high cholesterol in her brother and mother.   3) Frequent Headaches: Occurring daily and move around to different regions including frontal, temporal, and parietal lobes. During her visit in July 2023 she was placed on Propranolol ER 80 mg for prevention which was ineffective. Today she mentions that propranolol caused a headache.   She is managed on rizitriptan 10 mg PRN which helps with migraine abortion. She is needing a refill of her Zofran.   BP Readings from Last 3 Encounters:  08/15/22 122/84  03/16/22 110/76  12/13/21 118/74   Wt Readings from Last 3 Encounters:  08/15/22 182 lb (82.6 kg)  03/16/22 173 lb (78.5 kg)  12/13/21 175 lb (79.4 kg)      Review of Systems  Respiratory:  Negative for shortness of breath.   Cardiovascular:  Negative for chest pain.  Gastrointestinal:  Positive for constipation.  Neurological:  Negative for dizziness.         Past Medical History:  Diagnosis  Date   Allergy    Anxiety    Asthma    worse when younger   Cellulitis of face 05/05/2021   GERD (gastroesophageal reflux disease)    Migraine    1-2x/month   Persistent cough for 3 weeks or longer 10/09/2019   PONV (postoperative nausea and vomiting)    Seizure ([place])    Vaginal itching 05/27/2020   Vertigo    no episodes for 7 yrs    Social History   Socioeconomic History   Marital status: Widowed    Spouse name: [name]   Number of children: 0   Years of education: 10th   Highest education level: Not on file  Occupational History    Employer: [employer]  Tobacco Use   Smoking status: Former    Packs/day: 1.00    Types: Cigarettes    Quit date: 08/28/2013    Years since quitting: 8.9   Smokeless tobacco: Never   Tobacco comments:    Quit Janurary 1st  Vaping Use   Vaping Use: Never used  Substance and Sexual Activity   Alcohol use: No    Alcohol/week: 0.0 standard drinks of alcohol   Drug use: No   Sexual activity: Not Currently    Birth control/protection: Surgical  Other Topics Concern   Not on file  Social History Narrative   Patient lives alone   No children.   Works at [employer].  Enjoys going to [employer], going to estate sells.   Right handed   Caffeine: 1 Dr. [name] all day long (5-6)   Social Determinants of Health   Financial Resource Strain: Not on file  Food Insecurity: Not on file  Transportation Needs: Not on file  Physical Activity: Not on file  Stress: Not on file  Social Connections: Not on file  Intimate Partner Violence: Not on file    Past Surgical History:  Procedure Laterality Date   ABDOMINAL HYSTERECTOMY  2012   COLONOSCOPY     COLONOSCOPY WITH PROPOFOL N/A 04/24/2019   Procedure: COLONOSCOPY WITH PROPOFOL;  Surgeon: Virgel Manifold, MD;  Location: [facility] END[facility]COPY;  Service: Endoscopy;  Laterality: N/A;   ESOPHAGOGASTRODUODENOSCOPY (EGD) WITH PROPOFOL N/A 04/24/2019   Procedure: ESOPHAGOGASTRODUODENOSCOPY (EGD) WITH PROPOFOL;   Surgeon: Virgel Manifold, MD;  Location: [facility];  Service: Endoscopy;  Laterality: N/A;   FOOT SURGERY Right 01/24/2022   HAND SURGERY Right 05/07/2020   trigger release   UPPER GASTROINTESTINAL ENDOSCOPY      Family History  Problem Relation Age of Onset   Hypertension Mother    Colon cancer Neg Hx    Esophageal cancer Neg Hx    Stomach cancer Neg Hx    Rectal cancer Neg Hx     Allergies  Allergen Reactions   Asa Buff (Mag [Buffered Aspirin] Other (See Comments)    Stomach pain   Aspirin Other (See Comments)   Azithromycin Other (See Comments)    GI problems   Ibuprofen Other (See Comments)    GI upset   Penicillins Other (See Comments)    Unknown childhood allergic reaction Has patient had a PCN reaction causing immediate rash, facial/tongue/throat swelling, SOB or lightheadedness with hypotension: Unknown Has patient had a PCN reaction causing severe rash involving mucus membranes or skin necrosis: Unknown Has patient had a PCN reaction that required hospitalization: Unknown Has patient had a PCN reaction occurring within the last 10 years: No If all of the above answers are "NO", then may proceed with Cephalosporin use.    Current Outpatient Medications on File Prior to Visit  Medication Sig Dispense Refill   escitalopram (LEXAPRO) 20 MG tablet TAKE 1 TABLET BY MOUTH ONCE DAILY FOR ANXIETY 90 tablet 3   PHENobarbital (LUMINAL) 97.2 MG tablet Take 2 tablets (194.4 mg total) by mouth daily. 180 tablet 0   rizatriptan (MAXALT-MLT) 10 MG disintegrating tablet DISSOLVE 1 TABLET IN MOUTH ONCE DAILY AS NEEDED FOR MIGRAINE. MAY REPEAT IN 2 HOURS IF NEEDED 9 tablet 0   triamcinolone cream (KENALOG) 0.1 % Apply 1 Application topically 2 (two) times daily. 30 g 0   No current facility-administered medications on file prior to visit.    BP 122/84   Pulse 81   Temp 98.1 F (36.7 C) (Temporal)   Ht '5\' 5"'$  (1.651 m)   Wt 182 lb (82.6 kg)   SpO2 97%   BMI 30.29  kg/m  Objective:   Physical Exam Cardiovascular:     Rate and Rhythm: Normal rate and regular rhythm.  Pulmonary:     Effort: Pulmonary effort is normal.     Breath sounds: Normal breath sounds.  Musculoskeletal:     Cervical back: Neck supple.  Skin:    General: Skin is warm and dry.           Assessment & Plan:   Problem List Items Addressed This Visit       Cardiovascular and Mediastinum  Migraine without aura and without status migrainosus, not intractable - Primary   Relevant Medications   ondansetron (ZOFRAN) 4 MG tablet   amitriptyline (ELAVIL) 25 MG tablet     Other   Vitamin B 12 deficiency    Continue B12 injections at 1000 mcg. Repeat B12 level pending.      Relevant Orders   Vitamin B12   Frequent headaches    Uncontrolled.  Propranolol caused increased headaches.  Start amitriptyline 25 mg HS. Recommended she update in a few weeks. Also following with neurology.  Continue Maxalt 10 mg PRN. Refill provided for Zofran.       Relevant Medications   amitriptyline (ELAVIL) 25 MG tablet   Hyperlipidemia    Likely partially genetic, also related to inactivity and poor diet.  Discussed the importance of a healthy diet and regular exercise in order for weight loss, and to reduce the risk of further co-morbidity.  Repeat lipid panel pending today.      Relevant Orders   Lipid panel       Pleas Koch, NP

## 2022-08-15 NOTE — Assessment & Plan Note (Signed)
Likely partially genetic, also related to inactivity and poor diet.  Discussed the importance of a healthy diet and regular exercise in order for weight loss, and to reduce the risk of further co-morbidity.  Repeat lipid panel pending today.

## 2022-08-15 NOTE — Patient Instructions (Signed)
Stop by the lab prior to leaving today. I will notify you of your results once received.   It was a pleasure to see you today!  

## 2022-08-15 NOTE — Assessment & Plan Note (Signed)
Continue B12 injections at 1000 mcg. Repeat B12 level pending.

## 2022-08-15 NOTE — Assessment & Plan Note (Signed)
Uncontrolled.  Propranolol caused increased headaches.  Start amitriptyline 25 mg HS. Recommended she update in a few weeks. Also following with neurology.  Continue Maxalt 10 mg PRN. Refill provided for Zofran.

## 2022-08-15 NOTE — Addendum Note (Signed)
Addended by: Pat Kocher on: 08/15/2022 03:13 PM   Modules accepted: Orders

## 2022-08-16 ENCOUNTER — Ambulatory Visit: Payer: BC Managed Care – PPO | Admitting: Family Medicine

## 2022-08-16 ENCOUNTER — Encounter: Payer: Self-pay | Admitting: Family Medicine

## 2022-08-16 VITALS — BP 129/85 | HR 83 | Ht 65.0 in | Wt 182.5 lb

## 2022-08-16 DIAGNOSIS — G43009 Migraine without aura, not intractable, without status migrainosus: Secondary | ICD-10-CM | POA: Diagnosis not present

## 2022-08-16 DIAGNOSIS — G40B09 Juvenile myoclonic epilepsy, not intractable, without status epilepticus: Secondary | ICD-10-CM | POA: Diagnosis not present

## 2022-08-16 DIAGNOSIS — G4733 Obstructive sleep apnea (adult) (pediatric): Secondary | ICD-10-CM

## 2022-08-16 LAB — LIPID PANEL
Cholesterol: 255 mg/dL — ABNORMAL HIGH (ref 0–200)
HDL: 53.2 mg/dL (ref >39.00)
NonHDL: 202.14
Total CHOL/HDL Ratio: 5
Triglycerides: 223 mg/dL — ABNORMAL HIGH (ref 0.0–149.0)
VLDL: 44.6 mg/dL — ABNORMAL HIGH (ref 0.0–40.0)

## 2022-08-16 LAB — LDL CHOLESTEROL, DIRECT: Direct LDL: 167 mg/dL

## 2022-08-16 LAB — VITAMIN B12: Vitamin B-12: 335 pg/mL (ref 211–911)

## 2022-08-17 LAB — PHENOBARBITAL LEVEL: Phenobarbital, Serum: 29 ug/mL (ref 15–40)

## 2022-08-23 ENCOUNTER — Ambulatory Visit: Payer: BC Managed Care – PPO | Admitting: Family Medicine

## 2022-09-19 ENCOUNTER — Ambulatory Visit: Payer: BC Managed Care – PPO | Admitting: *Deleted

## 2022-09-19 DIAGNOSIS — E538 Deficiency of other specified B group vitamins: Secondary | ICD-10-CM

## 2022-09-19 MED ORDER — CYANOCOBALAMIN 1000 MCG/ML IJ SOLN
1000.0000 ug | Freq: Once | INTRAMUSCULAR | Status: AC
  Administered 2022-09-19: 1000 ug via INTRAMUSCULAR

## 2022-09-19 NOTE — Progress Notes (Signed)
Per orders of Romeo Apple injection of Vitamin B12  given by Lauralyn Primes. Patient tolerated injection well.

## 2022-10-06 ENCOUNTER — Other Ambulatory Visit: Payer: Self-pay | Admitting: Diagnostic Neuroimaging

## 2022-10-06 DIAGNOSIS — G43009 Migraine without aura, not intractable, without status migrainosus: Secondary | ICD-10-CM

## 2022-10-10 ENCOUNTER — Other Ambulatory Visit: Payer: Self-pay

## 2022-10-10 DIAGNOSIS — G43009 Migraine without aura, not intractable, without status migrainosus: Secondary | ICD-10-CM

## 2022-10-10 MED ORDER — RIZATRIPTAN BENZOATE 10 MG PO TBDP: ORAL_TABLET | ORAL | 0 refills | Status: DC

## 2022-10-17 ENCOUNTER — Other Ambulatory Visit: Payer: Self-pay | Admitting: *Deleted

## 2022-10-17 DIAGNOSIS — G40B09 Juvenile myoclonic epilepsy, not intractable, without status epilepticus: Secondary | ICD-10-CM

## 2022-10-17 MED ORDER — PHENOBARBITAL 97.2 MG PO TABS: 194.4000 mg | ORAL_TABLET | Freq: Every day | ORAL | 1 refills | Status: DC

## 2022-10-17 NOTE — Telephone Encounter (Signed)
Last seen 08/16/22 and next f/u 03/12/23. Per drug registry, last refilled 07/25/22 #[id number].

## 2022-10-17 NOTE — Telephone Encounter (Signed)
Patient was in the lobby dropping off paperwork and wanted to make sure that her prescription is called in since she has one left for tomorrow.

## 2022-10-18 DIAGNOSIS — Z0289 Encounter for other administrative examinations: Secondary | ICD-10-CM

## 2022-10-24 ENCOUNTER — Ambulatory Visit (INDEPENDENT_AMBULATORY_CARE_PROVIDER_SITE_OTHER): Payer: BC Managed Care – PPO

## 2022-10-24 DIAGNOSIS — E538 Deficiency of other specified B group vitamins: Secondary | ICD-10-CM

## 2022-10-24 MED ORDER — CYANOCOBALAMIN 1000 MCG/ML IJ SOLN
1000.0000 ug | Freq: Once | INTRAMUSCULAR | Status: AC
  Administered 2022-10-24: 1000 ug via INTRAMUSCULAR

## 2022-10-24 NOTE — Progress Notes (Signed)
Per orders of Dr. Elsie Stain, injection of B-12 given by Barkley Bruns in right deltoid. Patient tolerated injection well.

## 2022-10-26 ENCOUNTER — Encounter: Payer: Self-pay | Admitting: *Deleted

## 2022-10-30 ENCOUNTER — Telehealth: Payer: Self-pay | Admitting: *Deleted

## 2022-10-30 NOTE — Telephone Encounter (Signed)
I faxed pt [name] form on 10/30/2022

## 2022-10-30 NOTE — Telephone Encounter (Signed)
Gave completed/signed FMLA back to medical records to process for pt.

## 2022-11-14 DIAGNOSIS — M720 Palmar fascial fibromatosis [Dupuytren]: Secondary | ICD-10-CM | POA: Diagnosis not present

## 2022-12-29 ENCOUNTER — Encounter: Payer: Self-pay | Admitting: Primary Care

## 2022-12-29 ENCOUNTER — Ambulatory Visit: Payer: BC Managed Care – PPO | Admitting: Primary Care

## 2022-12-29 VITALS — BP 118/72 | HR 80 | Temp 98.7°F | Ht 65.0 in | Wt 179.0 lb

## 2022-12-29 DIAGNOSIS — R21 Rash and other nonspecific skin eruption: Secondary | ICD-10-CM | POA: Diagnosis not present

## 2022-12-29 MED ORDER — CLOBETASOL PROPIONATE 0.05 % EX CREA: 1.0000 | TOPICAL_CREAM | Freq: Two times a day (BID) | CUTANEOUS | 0 refills | Status: DC

## 2022-12-29 NOTE — Progress Notes (Signed)
Subjective:    Patient ID: [name], female    DOB: 10/31/1970, 52 y.o.   MRN: [id number]  Rash    [name] is a very pleasant 52 y.o. female with a history of hot flashes, asthma, migraines, eczema, GAD  who presents today to discuss rash.   Symptom onset 2-3 months ago with an irritated rash to the right posterior neck. Her rash is itchy and is more irritated with the hot weather.   Interestingly, her rash feels better with warm water. She's applied Eucerin, triamcinolone 0.1% cream, Nivea without improvement.   She denies rash elsewhere to her body, changes in soaps/detergents, unlaundered bed linens, pain, tingling.    Review of Systems  Skin:  Positive for rash.  Neurological:  Negative for numbness.         Past Medical History:  Diagnosis Date   Allergy    Anxiety    Asthma    worse when younger   Cellulitis of face 05/05/2021   GERD (gastroesophageal reflux disease)    Migraine    1-2x/month   Persistent cough for 3 weeks or longer 10/09/2019   PONV (postoperative nausea and vomiting)    Seizure (HCC)    Vaginal itching 05/27/2020   Vertigo    no episodes for 7 yrs    Social History   Socioeconomic History   Marital status: Widowed    Spouse name: Raiford Noble   Number of children: 0   Years of education: 10th   Highest education level: Not on file  Occupational History    Employer: [employer]  Tobacco Use   Smoking status: Former    Packs/day: 1    Types: Cigarettes    Quit date: 08/28/2013    Years since quitting: 9.3   Smokeless tobacco: Never   Tobacco comments:    Quit Janurary 1st  Vaping Use   Vaping Use: Never used  Substance and Sexual Activity   Alcohol use: No    Alcohol/week: 0.0 standard drinks of alcohol   Drug use: No   Sexual activity: Not Currently    Birth control/protection: Surgical  Other Topics Concern   Not on file  Social History Narrative   Patient lives alone   No children.   Works at [employer].    Enjoys going to [employer], going to estate sells.   Right handed   Caffeine: 1 Dr. Reino Kent all day long (5-6)   Social Determinants of Health   Financial Resource Strain: Not on file  Food Insecurity: Not on file  Transportation Needs: Not on file  Physical Activity: Not on file  Stress: Not on file  Social Connections: Not on file  Intimate Partner Violence: Not on file    Past Surgical History:  Procedure Laterality Date   ABDOMINAL HYSTERECTOMY  2012   COLONOSCOPY     COLONOSCOPY WITH PROPOFOL N/A 04/24/2019   Procedure: COLONOSCOPY WITH PROPOFOL;  Surgeon: Pasty Spillers, MD;  Location: [facility];  Service: Endoscopy;  Laterality: N/A;   ESOPHAGOGASTRODUODENOSCOPY (EGD) WITH PROPOFOL N/A 04/24/2019   Procedure: ESOPHAGOGASTRODUODENOSCOPY (EGD) WITH PROPOFOL;  Surgeon: Pasty Spillers, MD;  Location: [facility];  Service: Endoscopy;  Laterality: N/A;   FOOT SURGERY Right 01/24/2022   HAND SURGERY Right 05/07/2020   trigger release   UPPER GASTROINTESTINAL ENDOSCOPY      Family History  Problem Relation Age of Onset   Hypertension Mother    Colon cancer Neg Hx    Esophageal cancer Neg  Hx    Stomach cancer Neg Hx    Rectal cancer Neg Hx     Allergies  Allergen Reactions   Asa Buff (Mag [Buffered Aspirin] Other (See Comments)    Stomach pain   Aspirin Other (See Comments)   Azithromycin Other (See Comments)    GI problems   Ibuprofen Other (See Comments)    GI upset   Penicillins Other (See Comments)    Unknown childhood allergic reaction Has patient had a PCN reaction causing immediate rash, facial/tongue/throat swelling, SOB or lightheadedness with hypotension: Unknown Has patient had a PCN reaction causing severe rash involving mucus membranes or skin necrosis: Unknown Has patient had a PCN reaction that required hospitalization: Unknown Has patient had a PCN reaction occurring within the last 10 years: No If all of the above answers are  "NO", then may proceed with Cephalosporin use.    Current Outpatient Medications on File Prior to Visit  Medication Sig Dispense Refill   amitriptyline (ELAVIL) 25 MG tablet Take 1 tablet (25 mg total) by mouth at bedtime. For headache prevention 30 tablet 0   escitalopram (LEXAPRO) 20 MG tablet TAKE 1 TABLET BY MOUTH ONCE DAILY FOR ANXIETY 90 tablet 3   ondansetron (ZOFRAN) 4 MG tablet Take 1 tablet (4 mg total) by mouth every 8 (eight) hours as needed for nausea or vomiting. 10 tablet 0   PHENobarbital (LUMINAL) 97.2 MG tablet Take 2 tablets (194.4 mg total) by mouth daily. 180 tablet 1   rizatriptan (MAXALT-MLT) 10 MG disintegrating tablet DISSOLVE 1 TABLET IN MOUTH ONCE DAILY AS NEEDED FOR MIGRAINE. MAY REPEAT IN 2 HOURS IF NEEDED 9 tablet 0   triamcinolone cream (KENALOG) 0.1 % Apply 1 Application topically 2 (two) times daily. (Patient not taking: Reported on 12/29/2022) 30 g 0   No current facility-administered medications on file prior to visit.    BP 118/72   Pulse 80   Temp 98.7 F (37.1 C) (Temporal)   Ht 5\' 5"  (1.651 m)   Wt 179 lb (81.2 kg)   SpO2 97%   BMI 29.79 kg/m  Objective:   Physical Exam Constitutional:      General: She is not in acute distress. Cardiovascular:     Rate and Rhythm: Normal rate.  Pulmonary:     Effort: Pulmonary effort is normal.  Skin:    General: Skin is warm and dry.     Findings: Rash present.     Comments: 6-10 raised, light to bright red spots, some with scaling to right lower and upper posterior neck. Slight evidence into lower scalp/hairline.            Assessment & Plan:  Rash and nonspecific skin eruption Assessment & Plan: Unclear etiology.  At initial glance her rash appears more like herpes zoster, but the duration and characteristic of symptoms doesn't make sense. At this point, Valtrex may not be effective as symptoms began months ago.   Start clobetasol 0.05% cream BID. She will update in 1 week.    Orders: -      Clobetasol Propionate; Apply 1 Application topically 2 (two) times daily.  Dispense: 30 g; Refill: 0        Doreene Nest, NP

## 2022-12-29 NOTE — Patient Instructions (Signed)
Start clobetasol 0.05% cream. Apply the cream twice daily.  Please update me!

## 2022-12-29 NOTE — Assessment & Plan Note (Signed)
Unclear etiology.  At initial glance her rash appears more like herpes zoster, but the duration and characteristic of symptoms doesn't make sense. At this point, Valtrex may not be effective as symptoms began months ago.   Start clobetasol 0.05% cream BID. She will update in 1 week.

## 2023-01-24 ENCOUNTER — Ambulatory Visit: Payer: BC Managed Care – PPO | Admitting: Podiatry

## 2023-01-24 ENCOUNTER — Encounter: Payer: Self-pay | Admitting: Podiatry

## 2023-01-24 ENCOUNTER — Ambulatory Visit (INDEPENDENT_AMBULATORY_CARE_PROVIDER_SITE_OTHER): Payer: BC Managed Care – PPO

## 2023-01-24 DIAGNOSIS — M7751 Other enthesopathy of right foot: Secondary | ICD-10-CM

## 2023-01-24 DIAGNOSIS — M21621 Bunionette of right foot: Secondary | ICD-10-CM

## 2023-01-24 MED ORDER — TRIAMCINOLONE ACETONIDE 10 MG/ML IJ SUSP
10.0000 mg | Freq: Once | INTRAMUSCULAR | Status: AC
Start: 2023-01-24 — End: 2023-01-24
  Administered 2023-01-24: 10 mg

## 2023-01-24 NOTE — Progress Notes (Signed)
Subjective:   Patient ID: [name], female   DOB: 52 y.o.   MRN: [id number]   HPI Patient presents stating she has a lot of pain in the outside of the right foot and it started a couple months ago   ROS      Objective:  Physical Exam  Neurovascular status intact muscle strength found to be adequate range of motion adequate with inflammation around the fifth MPJ right foot with fluid buildup around the joint surface and pain with palpation.  Good digital perfusion well-oriented     Assessment:  Inflammatory capsulitis and fluid buildup around the fifth MPJ right foot with tailor's bunion deformity     Plan:  H&P reviewed and went ahead today did do sterile prep injected around the fifth MPJ 3 mg dexamethasone Kenalog 5 mg Xylocaine and discussed the possibility for osteotomy in the future.  Patient will be seen back as symptoms indicate  X-rays do indicate inflammation with moderate elevation of the 4 5 intermetatarsal angle and what appears to be fluid around the joint surface no signs of arthritis or other pathology

## 2023-02-11 ENCOUNTER — Other Ambulatory Visit: Payer: Self-pay | Admitting: Diagnostic Neuroimaging

## 2023-02-11 DIAGNOSIS — G43009 Migraine without aura, not intractable, without status migrainosus: Secondary | ICD-10-CM

## 2023-02-16 ENCOUNTER — Encounter: Payer: Self-pay | Admitting: Primary Care

## 2023-02-16 ENCOUNTER — Ambulatory Visit: Payer: BC Managed Care – PPO | Admitting: Primary Care

## 2023-02-16 VITALS — BP 102/76 | HR 122 | Temp 98.6°F | Ht 65.0 in | Wt 170.0 lb

## 2023-02-16 DIAGNOSIS — U071 COVID-19: Secondary | ICD-10-CM | POA: Diagnosis not present

## 2023-02-16 HISTORY — DX: COVID-19: U07.1

## 2023-02-16 LAB — POCT INFLUENZA A/B
Influenza A, POC: NEGATIVE
Influenza B, POC: NEGATIVE

## 2023-02-16 LAB — POC COVID19 BINAXNOW: SARS Coronavirus 2 Ag: POSITIVE — AB

## 2023-02-16 MED ORDER — KETOROLAC TROMETHAMINE 60 MG/2ML IM SOLN
60.0000 mg | Freq: Once | INTRAMUSCULAR | Status: AC
Start: 2023-02-16 — End: 2023-02-16
  Administered 2023-02-16: 60 mg via INTRAMUSCULAR

## 2023-02-16 MED ORDER — MOLNUPIRAVIR EUA 200MG CAPSULE
4.0000 | ORAL_CAPSULE | Freq: Two times a day (BID) | ORAL | 0 refills | Status: AC
Start: 2023-02-16 — End: 2023-02-21

## 2023-02-16 MED ORDER — PREDNISONE 20 MG PO TABS
ORAL_TABLET | ORAL | 0 refills | Status: DC
Start: 2023-02-16 — End: 2023-03-12

## 2023-02-16 MED ORDER — ONDANSETRON 4 MG PO TBDP
4.0000 mg | ORAL_TABLET | Freq: Three times a day (TID) | ORAL | 0 refills | Status: DC | PRN
Start: 2023-02-16 — End: 2023-06-13

## 2023-02-16 NOTE — Addendum Note (Signed)
Addended by: Lonia Blood on: 02/16/2023 10:14 AM   Modules accepted: Orders

## 2023-02-16 NOTE — Assessment & Plan Note (Signed)
Positive COVID test today in the office. Negative influenza.  She is at high risk for hospitalization given her history of asthma and seizures.  Start molnupiravir antiviral treatment, 4 capsules twice daily x 5 days. There was an interaction between Paxlovid and her phenobarbital.  Start prednisone 20 mg tablets. Take 2 tablets by mouth once daily in the morning for 5 days for chest tightness and wheezing.  Toradol 60 mg IM provided today for headache.  Hospital precautions provided.  Work note provided.

## 2023-02-16 NOTE — Patient Instructions (Signed)
Start molnupiravir antiviral medication for COVID-19 infection.  Take 4 capsules by mouth twice daily for 5 days.  Start prednisone 20 mg tablets. Take 2 tablets by mouth once daily in the morning for 5 days.  You can take the Zofran antinausea medicine every 8 hours as needed for nausea.  Remain out of work for now.  You can return Tuesday if you are feeling better but you must wear a mask.  If you begin feeling worse then please go to the hospital.  It was a pleasure to see you today!

## 2023-02-16 NOTE — Addendum Note (Signed)
Addended by: Doreene Nest on: 02/16/2023 10:02 AM   Modules accepted: Level of Service

## 2023-02-16 NOTE — Progress Notes (Signed)
Subjective:    Patient ID: [name], female    DOB: 11/28/1970, 51 y.o.   MRN: [id number]  Sore Throat  Associated symptoms include congestion, coughing, ear pain, headaches and shortness of breath.    [name] is a very pleasant 52 y.o. female with a history of asthma, GERD, epilepsy, frequent headaches who presents today to discuss URI symptoms.  Symptom onset 3 days ago with sore throat, headache, fever of 102, dry cough, and dizziness. She almost called EMS two evenings ago due as "my head felt that it was going to bust". This morning she vomited 3 times, began feeling weak, noticed a dry and loss her sense of taste and smell, chest tightness, wheezing.   She has not tested for Covid-19 infection. She felt her fever break this morning as she was "burning up"  She's trying to stay hydrated with Gatorade and water. She's been taking Tylenol and Nyquil for her fevers.   Review of Systems  Constitutional:  Positive for fatigue and fever.  HENT:  Positive for congestion, ear pain and sore throat.   Respiratory:  Positive for cough, chest tightness, shortness of breath and wheezing.   Neurological:  Positive for headaches.         Past Medical History:  Diagnosis Date   Allergy    Anxiety    Asthma    worse when younger   Cellulitis of face 05/05/2021   GERD (gastroesophageal reflux disease)    Migraine    1-2x/month   Persistent cough for 3 weeks or longer 10/09/2019   PONV (postoperative nausea and vomiting)    Seizure (HCC)    Vaginal itching 05/27/2020   Vertigo    no episodes for 7 yrs    Social History   Socioeconomic History   Marital status: Widowed    Spouse name: [name]   Number of children: 0   Years of education: 10th   Highest education level: Not on file  Occupational History    Employer: [employer]  Tobacco Use   Smoking status: Former    Packs/day: 1    Types: Cigarettes    Quit date: 08/28/2013    Years since quitting: 9.4    Smokeless tobacco: Never   Tobacco comments:    Quit Janurary 1st  Vaping Use   Vaping Use: Never used  Substance and Sexual Activity   Alcohol use: No    Alcohol/week: 0.0 standard drinks of alcohol   Drug use: No   Sexual activity: Not Currently    Birth control/protection: Surgical  Other Topics Concern   Not on file  Social History Narrative   Patient lives alone   No children.   Works at [employer].   Enjoys going to [employer], going to estate sells.   Right handed   Caffeine: 1 Dr. Reino Kent all day long (5-6)   Social Determinants of Health   Financial Resource Strain: Not on file  Food Insecurity: Not on file  Transportation Needs: Not on file  Physical Activity: Not on file  Stress: Not on file  Social Connections: Not on file  Intimate Partner Violence: Not on file    Past Surgical History:  Procedure Laterality Date   ABDOMINAL HYSTERECTOMY  2012   COLONOSCOPY     COLONOSCOPY WITH PROPOFOL N/A 04/24/2019   Procedure: COLONOSCOPY WITH PROPOFOL;  Surgeon: Pasty Spillers, MD;  Location: [facility];  Service: Endoscopy;  Laterality: N/A;   ESOPHAGOGASTRODUODENOSCOPY (EGD) WITH PROPOFOL N/A 04/24/2019  Procedure: ESOPHAGOGASTRODUODENOSCOPY (EGD) WITH PROPOFOL;  Surgeon: Pasty Spillers, MD;  Location: [facility];  Service: Endoscopy;  Laterality: N/A;   FOOT SURGERY Right 01/24/2022   HAND SURGERY Right 05/07/2020   trigger release   UPPER GASTROINTESTINAL ENDOSCOPY      Family History  Problem Relation Age of Onset   Hypertension Mother    Colon cancer Neg Hx    Esophageal cancer Neg Hx    Stomach cancer Neg Hx    Rectal cancer Neg Hx     Allergies  Allergen Reactions   Asa Buff (Mag [Buffered Aspirin] Other (See Comments)    Stomach pain   Aspirin Other (See Comments)   Azithromycin Other (See Comments)    GI problems   Ibuprofen Other (See Comments)    GI upset   Penicillins Other (See Comments)    Unknown childhood allergic  reaction Has patient had a PCN reaction causing immediate rash, facial/tongue/throat swelling, SOB or lightheadedness with hypotension: Unknown Has patient had a PCN reaction causing severe rash involving mucus membranes or skin necrosis: Unknown Has patient had a PCN reaction that required hospitalization: Unknown Has patient had a PCN reaction occurring within the last 10 years: No If all of the above answers are "NO", then may proceed with Cephalosporin use.    Current Outpatient Medications on File Prior to Visit  Medication Sig Dispense Refill   amitriptyline (ELAVIL) 25 MG tablet Take 1 tablet (25 mg total) by mouth at bedtime. For headache prevention 30 tablet 0   clobetasol cream (TEMOVATE) 0.05 % Apply 1 Application topically 2 (two) times daily. 30 g 0   escitalopram (LEXAPRO) 20 MG tablet TAKE 1 TABLET BY MOUTH ONCE DAILY FOR ANXIETY 90 tablet 3   PHENobarbital (LUMINAL) 97.2 MG tablet Take 2 tablets (194.4 mg total) by mouth daily. 180 tablet 1   rizatriptan (MAXALT-MLT) 10 MG disintegrating tablet DISSOLVE 1 TABLET IN MOUTH ONCE DAILY AS NEEDED FOR MIGRAINE. MAY REPEAT IN 2 HOURS IF NEEDED 9 tablet 0   triamcinolone cream (KENALOG) 0.1 % Apply 1 Application topically 2 (two) times daily. (Patient not taking: Reported on 12/29/2022) 30 g 0   No current facility-administered medications on file prior to visit.    BP 102/76   Pulse (!) 122   Temp 98.6 F (37 C) (Oral)   Ht 5\' 5"  (1.651 m)   Wt 170 lb (77.1 kg)   SpO2 98%   BMI 28.29 kg/m  Objective:   Physical Exam Constitutional:      Appearance: She is ill-appearing.  HENT:     Right Ear: Tympanic membrane is bulging. Tympanic membrane is not injected or erythematous.     Left Ear: Tympanic membrane is bulging. Tympanic membrane is not injected or erythematous.  Cardiovascular:     Rate and Rhythm: Normal rate and regular rhythm.  Pulmonary:     Effort: Pulmonary effort is normal.     Breath sounds: Examination of  the left-lower field reveals wheezing. Wheezing present.     Comments: Dry cough noted during exam Skin:    General: Skin is warm and dry.           Assessment & Plan:  COVID-19 virus infection Assessment & Plan: Positive COVID test today in the office. Negative influenza.  She is at high risk for hospitalization given her history of asthma and seizures.  Start molnupiravir antiviral treatment, 4 capsules twice daily x 5 days. There was an interaction between Paxlovid and her phenobarbital.  Start prednisone 20 mg tablets. Take 2 tablets by mouth once daily in the morning for 5 days for chest tightness and wheezing.  Toradol 60 mg IM provided today for headache.  Hospital precautions provided.  Work note provided.  Orders: -     Ondansetron; Take 1 tablet (4 mg total) by mouth every 8 (eight) hours as needed for nausea or vomiting.  Dispense: 15 tablet; Refill: 0 -     predniSONE; Take 2 tablets by mouth once daily for 5 days.  Dispense: 10 tablet; Refill: 0 -     molnupiravir EUA; Take 4 capsules (800 mg total) by mouth 2 (two) times daily for 5 days.  Dispense: 40 capsule; Refill: 0        Doreene Nest, NP

## 2023-02-20 ENCOUNTER — Telehealth: Payer: Self-pay | Admitting: Primary Care

## 2023-02-20 NOTE — Telephone Encounter (Signed)
Patient dropped off document FMLA, to be filled out by provider. Patient requested to send it back via Fax within 5-days. Document is located in providers tray at front office.Please advise at Mobile [phone] (mobile) 

## 2023-02-21 NOTE — Telephone Encounter (Signed)
Noted. Paperwork completed and placed in [employer].

## 2023-02-21 NOTE — Telephone Encounter (Signed)
Spoke with patient, she did return to work today. She is feeling okay most of her symptoms have resolved except the loss of appetite and feeling very weak still. She would like paperwork faxed directly to employer once completed.

## 2023-02-21 NOTE — Telephone Encounter (Signed)
Placed in [place] inbox for review and completion

## 2023-02-21 NOTE — Telephone Encounter (Signed)
Please call patient:  I reviewed her paperwork.   She marked that she would return to work today, did she return to work today? How she feeling?

## 2023-02-26 NOTE — Telephone Encounter (Signed)
Patient contacted the office regarding FMLA ppw. Stated that there was an issue to where a page was missed in her paperwork. She was told neither provider or her had filled out the page needed. Advised patient to possibly bring in another copy or to have her HR dept fax another copy to [place] to fill out

## 2023-02-27 NOTE — Telephone Encounter (Signed)
Spoke with patient, she will drop off additional paperwork for [place] to fill out tomorrow.

## 2023-02-28 ENCOUNTER — Telehealth: Payer: Self-pay | Admitting: Primary Care

## 2023-02-28 NOTE — Telephone Encounter (Signed)
Patient dropped off additional FMLA ppw. Placed in [place] inbox for review and completion

## 2023-02-28 NOTE — Telephone Encounter (Signed)
Patient dropped off document FMLA, to be filled out by provider. Patient requested to send it back via Fax within 5-days. Document is located in providers tray at front office.Please advise at Mobile [phone] (mobile)

## 2023-03-02 NOTE — Telephone Encounter (Signed)
Completed and placed in [facility]'s inbox for faxing. 

## 2023-03-02 NOTE — Telephone Encounter (Signed)
Faxed over the completed form.

## 2023-03-12 ENCOUNTER — Ambulatory Visit: Payer: BC Managed Care – PPO | Admitting: Family Medicine

## 2023-03-12 ENCOUNTER — Encounter: Payer: Self-pay | Admitting: Family Medicine

## 2023-03-12 VITALS — BP 112/72 | HR 89 | Ht 65.0 in | Wt 174.4 lb

## 2023-03-12 DIAGNOSIS — G43009 Migraine without aura, not intractable, without status migrainosus: Secondary | ICD-10-CM | POA: Diagnosis not present

## 2023-03-12 DIAGNOSIS — G4733 Obstructive sleep apnea (adult) (pediatric): Secondary | ICD-10-CM

## 2023-03-12 DIAGNOSIS — G40B09 Juvenile myoclonic epilepsy, not intractable, without status epilepticus: Secondary | ICD-10-CM | POA: Diagnosis not present

## 2023-03-12 MED ORDER — QULIPTA 60 MG PO TABS: 60.0000 mg | ORAL_TABLET | Freq: Every day | ORAL | 3 refills | Status: DC

## 2023-03-12 MED ORDER — PHENOBARBITAL 97.2 MG PO TABS
194.4000 mg | ORAL_TABLET | Freq: Every day | ORAL | 1 refills | Status: DC
Start: 2023-03-12 — End: 2023-10-10

## 2023-03-12 NOTE — Progress Notes (Signed)
Chief Complaint  Patient presents with   Follow-up    Pt in room 1. Here for seizures. Pt is not using cpap, too expensive.  Pt reports no recent seizures. Pt reports daily headaches , takes tylenol daily headaches.     HISTORY OF PRESENT ILLNESS:  03/12/23 ALL:  [name] returns for follow up for seizures and OSA. She was last seen 07/2022 and doing well on phenobarbital 194.4mg  daily. She reported continued headaches. PCP had started amitriptyline just prior to our appt but she had not started. She reported being unable to afford CPAP and was going to speak to her dentist regarding an oral appliance.   Since, she continues to have daily tension style headaches. She has 12 migrainous headaches, on average, per month. Rizatriptan works well for abortive therapy. She was unable to afford copay for CPAP. She has not spoken to dentist about oral appliance. No seizure activity.   Tried and failed: Ajovy, amitriptyline, topiramate, propranolol, rizatriptan   08/16/2022 ALL: [name] returns for follow up for headaches, seizures and OSA. She was last seen by me 02/2021 and reported worsening headaches. Sleep referral placed and She was seen in consult with Dr Frances Furbish 07/2021. HST 07/2021 showed moderate OSA with total AHI 15.4/h, REM AHI 29.6/h. CPAP advised. She reports not being able to afford a machine. She was going to have to pay a deposit and pay 75 per month. She lives alone and could not afford.   She has discontinued propranolol LA 80mg  daily. She did not feel it was helpful. Dr Chestine Spore, PCP, started amitriptyline at follow up yesterday. She has not started, yet. She continues rizatriptan as needed. She continues to have daily headaches. She may have 2-3 severe migraines per month.   She continues phenobarbital 194.4mg  daily for seizure management. No recent seizure activity. She is tolerating med well with no obvious adverse effects.   03/14/2021 ALL: [name] is a 52 y.o. female here  today for follow up for headaches and seizures. She continues phenobarbital 2 tablets daily. No recent seizures. She continues to have daily headaches. Most occur in the mornings or when she wakes from sleep. She was seen by PCP for excessive daytime sleepiness. She was missing work due to not being able to wake up. She is taking Tylenol daily, usually about 8 - 500mg  tablets. She has about 1 migraine per month. Rizatriptan usually aborts migraine. She was told by her late husband that she snores. She denies family history of sleep apnea. She was seen by Dr Frances Furbish in 2017 and sleep study advised but she did not pursue.    HISTORY (copied from Dr Richrd Humbles previous note)  UPDATE (02/09/20, VRP): Since last visit, doing about the same. Daily low grade HA, but rare migraine. No longer on ajovy (hard to take shots on her own). Symptoms are stable. No seizures.   PRIOR HPI (12/25/18): - doing well; no seizures - HA are improving on ajovy - unfortunately patient's husband deceased now (from cancer) - overall stable   UPDATE (12/31/17, VRP): Since last visit, doing the same with HA (daily basis). Tried TPX, but HA worse. Rizatriptan helps. Tried lower PB, but had muscle spasms / myoclonus, but no grand mal seizures.    UPDATE (09/02/16, VRP): Since last visit, doing well. No seizures. Tolerating meds. No alleviating or aggravating factors. In last 3 weeks has had more HA / migraines (daily). Rizatriptan and tylenol not helping that much. Had cut down tylenol for a  while, but now back up to 6 tabs per day. Drinking 1 soda per day. Eating 3 times per day. Sleep is a little better.   UPDATE (09/02/16, VRP): Since last visit, doing well --> no seizures. Tolerating meds. No alleviating or aggravating factors. Has had 3 weeks of consistent headaches. Taking up to tylenol x 6 tabs per day.   UPDATE 06/26/16: Since last visit, doing well. No seizures. Sleep is improved on her own. Tried on amitriptyline, but HA felt  worse, so she stopped it. Having 2-3 migraine per month, well controlled with rizatriptan.   UPDATE 12/24/15: Since last visit, no seizures. Still with daily headaches. Also with 1 severe migraine per month. Eating 2 x per day. Soda (1 per day). Still with 6 tylenol tabs per day. Amitriptyline helped some in the beginning, but seems to be less effective now.   UPDATE 10/22/15: Since last visit, no seizures. Has cut down soda (now 1-2 per day), has quit smoking, and now exercising some. Still only eats 1 meal per day. Still taking 5-9 tabs tylenol on daily basis. Has 1 migraine per month (pounding global HA, sens to light / sound). Also with daily lower level headaches. Still poor sleep quality.   UPDATE 10/21/14: Since last visit, no seizures. C/o daily headaches, and taking daily Tylenol. She has interrupted sleep at nighttime. She continues to drink 5-6 Dr. Reino Kent cans per day. She eats once per day. She has increased stress related to work. Fortunately patient has been able to quit smoking cigarettes since 08/28/2014.   UPDATE 10/21/13: Since last visit, no new events or seizures. Now has [facility], but no PCP. Tolerating PB 97.2mg  tabs (2 tabs qAM).   UPDATE 05/29/12: No seizure. No PCP. No insurance.   UPDATE 12/22/10: No seizure since age 32's.  Tolerating PB, and wants to stay on it.  Having 1 migraine every 1-2 years (global, severe, pressure, photophobia, phonophobia).  Usually midrin helps.  Never tried triptan.  Also using tylenol for tension HA.  Last migraine Aug 2011.   PRIOR HPI (12/15/08, Dr. Sharene Skeans): "[name] is a 52 year old married woman with a long-standing history of juvenile myoclonic epilepsy.  She has not had seizures for years.  The only manifestation of her symptoms has been generalized tonic-clonic seizures.  She has taken and tolerated Phenobarbital without significant side effects. Patient has daily headaches that I think are more tension type in nature than migraine.  Become  on later in the day except when she has stress. Since her last visit, she had hysterectomy and oophorectomy for removal of an ovarian cyst.  She was noted to have scarred fallopian tubes at the time of operation."   REVIEW OF SYSTEMS: Out of a complete 14 system review of symptoms, the patient complains only of the following symptoms, headaches, anxiety and all other reviewed systems are negative.   ALLERGIES: Allergies  Allergen Reactions   Asa Buff (Mag [Buffered Aspirin] Other (See Comments)    Stomach pain   Aspirin Other (See Comments)   Azithromycin Other (See Comments)    GI problems   Ibuprofen Other (See Comments)    GI upset   Penicillins Other (See Comments)    Unknown childhood allergic reaction Has patient had a PCN reaction causing immediate rash, facial/tongue/throat swelling, SOB or lightheadedness with hypotension: Unknown Has patient had a PCN reaction causing severe rash involving mucus membranes or skin necrosis: Unknown Has patient had a PCN reaction that required hospitalization: Unknown Has patient had  a PCN reaction occurring within the last 10 years: No If all of the above answers are "NO", then may proceed with Cephalosporin use.     HOME MEDICATIONS: Outpatient Medications Prior to Visit  Medication Sig Dispense Refill   clobetasol cream (TEMOVATE) 0.05 % Apply 1 Application topically 2 (two) times daily. 30 g 0   escitalopram (LEXAPRO) 20 MG tablet TAKE 1 TABLET BY MOUTH ONCE DAILY FOR ANXIETY 90 tablet 3   ondansetron (ZOFRAN-ODT) 4 MG disintegrating tablet Take 1 tablet (4 mg total) by mouth every 8 (eight) hours as needed for nausea or vomiting. 15 tablet 0   PHENobarbital (LUMINAL) 97.2 MG tablet Take 2 tablets (194.4 mg total) by mouth daily. 180 tablet 1   rizatriptan (MAXALT-MLT) 10 MG disintegrating tablet DISSOLVE 1 TABLET IN MOUTH ONCE DAILY AS NEEDED FOR MIGRAINE. MAY REPEAT IN 2 HOURS IF NEEDED 9 tablet 0   triamcinolone cream (KENALOG) 0.1 %  Apply 1 Application topically 2 (two) times daily. 30 g 0   amitriptyline (ELAVIL) 25 MG tablet Take 1 tablet (25 mg total) by mouth at bedtime. For headache prevention 30 tablet 0   predniSONE (DELTASONE) 20 MG tablet Take 2 tablets by mouth once daily for 5 days. 10 tablet 0   No facility-administered medications prior to visit.     PAST MEDICAL HISTORY: Past Medical History:  Diagnosis Date   Allergy    Anxiety    Asthma    worse when younger   Cellulitis of face 05/05/2021   GERD (gastroesophageal reflux disease)    Migraine    1-2x/month   Persistent cough for 3 weeks or longer 10/09/2019   PONV (postoperative nausea and vomiting)    Seizure (HCC)    Vaginal itching 05/27/2020   Vertigo    no episodes for 7 yrs     PAST SURGICAL HISTORY: Past Surgical History:  Procedure Laterality Date   ABDOMINAL HYSTERECTOMY  2012   COLONOSCOPY     COLONOSCOPY WITH PROPOFOL N/A 04/24/2019   Procedure: COLONOSCOPY WITH PROPOFOL;  Surgeon: Pasty Spillers, MD;  Location: [facility];  Service: Endoscopy;  Laterality: N/A;   ESOPHAGOGASTRODUODENOSCOPY (EGD) WITH PROPOFOL N/A 04/24/2019   Procedure: ESOPHAGOGASTRODUODENOSCOPY (EGD) WITH PROPOFOL;  Surgeon: Pasty Spillers, MD;  Location: [facility] ENDOSCOPY;  Service: Endoscopy;  Laterality: N/A;   FOOT SURGERY Right 01/24/2022   HAND SURGERY Right 05/07/2020   trigger release   UPPER GASTROINTESTINAL ENDOSCOPY       FAMILY HISTORY: Family History  Problem Relation Age of Onset   Hypertension Mother    Colon cancer Neg Hx    Esophageal cancer Neg Hx    Stomach cancer Neg Hx    Rectal cancer Neg Hx      SOCIAL HISTORY: Social History   Socioeconomic History   Marital status: Widowed    Spouse name: Raiford Noble   Number of children: 0   Years of education: 10th   Highest education level: Not on file  Occupational History    Employer: [id number]  Tobacco Use   Smoking status: Former    Current packs/day: 0.00    Types:  Cigarettes    Quit date: 08/28/2013    Years since quitting: 9.5   Smokeless tobacco: Never   Tobacco comments:    Quit Janurary 1st  Vaping Use   Vaping status: Never Used  Substance and Sexual Activity   Alcohol use: No    Alcohol/week: 0.0 standard drinks of alcohol   Drug use: No  Sexual activity: Not Currently    Birth control/protection: Surgical  Other Topics Concern   Not on file  Social History Narrative   Patient lives alone   No children.   Works at [employer].   Enjoys going to [employer], going to estate sells.   Right handed   Caffeine: 1 Dr. Reino Kent all day long (5-6)   Social Determinants of Health   Financial Resource Strain: Not on file  Food Insecurity: Not on file  Transportation Needs: Not on file  Physical Activity: Not on file  Stress: Not on file  Social Connections: Not on file  Intimate Partner Violence: Not on file     PHYSICAL EXAM  Vitals:   03/12/23 1352  BP: 112/72  Pulse: 89  Weight: 174 lb 6.4 oz (79.1 kg)  Height: 5\' 5"  (1.651 m)     Body mass index is 29.02 kg/m.   Generalized: Well developed, in no acute distress  Cardiology: normal rate and rhythm, no murmur auscultated  Respiratory: clear to auscultation bilaterally    Neurological examination  Mentation: Alert oriented to time, place, history taking. Follows all commands speech and language fluent Cranial nerve II-XII: Pupils were equal round reactive to light. Extraocular movements were full, visual field were full on confrontational test. Facial sensation and strength were normal. Uvula tongue midline. Head turning and shoulder shrug  were normal and symmetric. Motor: The motor testing reveals 5 over 5 strength of all 4 extremities. Good symmetric motor tone is noted throughout.  Sensory: Sensory testing is intact to soft touch on all 4 extremities. No evidence of extinction is noted.  Coordination: Cerebellar testing reveals good finger-nose-finger and heel-to-shin  bilaterally.  Gait and station: Gait is normal. Tandem gait is normal. Romberg is negative. No drift is seen.  Reflexes: Deep tendon reflexes are symmetric and normal bilaterally.    DIAGNOSTIC DATA (LABS, IMAGING, TESTING) - I reviewed patient records, labs, notes, testing and imaging myself where available.  Lab Results  Component Value Date   WBC 7.5 03/16/2022   HGB 13.9 03/16/2022   HCT 41.4 03/16/2022   MCV 93.3 03/16/2022   PLT 212.0 03/16/2022      Component Value Date/Time   NA 139 03/16/2022 1434   NA 140 10/22/2015 1206   K 4.3 03/16/2022 1434   CL 103 03/16/2022 1434   CO2 27 03/16/2022 1434   GLUCOSE 94 03/16/2022 1434   BUN 11 03/16/2022 1434   BUN 9 10/22/2015 1206   CREATININE 0.80 03/16/2022 1434   CREATININE 0.63 11/30/2017 1549   CALCIUM 9.2 03/16/2022 1434   PROT 7.4 03/16/2022 1434   PROT 6.5 10/22/2015 1206   ALBUMIN 4.5 03/16/2022 1434   ALBUMIN 4.1 10/22/2015 1206   AST 13 03/16/2022 1434   ALT 13 03/16/2022 1434   ALKPHOS 87 03/16/2022 1434   BILITOT 0.3 03/16/2022 1434   BILITOT 0.2 10/22/2015 1206   GFRNONAA >60 08/31/2021 0610   GFRAA >60 01/08/2020 2145   Lab Results  Component Value Date   CHOL 255 (H) 08/15/2022   HDL 53.20 08/15/2022   LDLCALC 174 (H) 03/16/2022   LDLDIRECT 167.0 08/15/2022   TRIG 223.0 (H) 08/15/2022   CHOLHDL 5 08/15/2022   Lab Results  Component Value Date   HGBA1C 5.5 03/16/2022   Lab Results  Component Value Date   VITAMINB12 335 08/15/2022   Lab Results  Component Value Date   TSH 1.44 02/18/2021        No data to  display               No data to display           ASSESSMENT AND PLAN  52 y.o. year old female  has a past medical history of Allergy, Anxiety, Asthma, Cellulitis of face (05/05/2021), GERD (gastroesophageal reflux disease), Migraine, Persistent cough for 3 weeks or longer (10/09/2019), PONV (postoperative nausea and vomiting), Seizure (HCC), Vaginal itching (05/27/2020),  and Vertigo. here with    Juvenile myoclonic epilepsy, not intractable, without status epilepticus (HCC)  OSA (obstructive sleep apnea)  Migraine without aura and without status migrainosus, not intractable  Galilee is doing well from a seizure perspective. We will continue phenobarbital 194.4mg  daily. Refill sent. We have reviewed HST results and discussed management of sleep apneas as well as adverse effects of unmanaged sleep apnea. She does not feel she can afford a CPAP machine. We have discussed consideration of an oral appliance. She will discuss with her dentist and let me know if referral is needed. I will try her on Qulipta 60mg  daily. Previously did well with Ajovy but unable to continue giving herself injections following loss of her husband. She will continue rizatriptan as needed for abortive therapy. Complementary therapies reviewed for headache management. Healthy lifestyle habits encouraged. She will follow up with me in 6 months, sooner if needed. She verbalizes understanding and agreement with this plan.    No orders of the defined types were placed in this encounter.    Meds ordered this encounter  Medications   Atogepant (QULIPTA) 60 MG TABS    Sig: Take 1 tablet (60 mg total) by mouth daily.    Dispense:  90 tablet    Refill:  3    Order Specific Question:   Supervising Provider    Answer:   Anson Fret [[id number]]      Shawnie Dapper, MSN, FNP-C 03/12/2023, 2:30 PM  [facility] [address], [address] [place], [place] [address] [phone]

## 2023-03-12 NOTE — Patient Instructions (Signed)
Below is our plan:  We will continue phenobarbital 194.4mg  daily. Ask dentist about an oral appliance to treat sleep apnea. We will start Qulipta 60mg  daily. Continue rizatriptan as needed for migraine abortion.   Please make sure you are staying well hydrated. I recommend 50-60 ounces daily. Well balanced diet and regular exercise encouraged. Consistent sleep schedule with 6-8 hours recommended.   Please continue follow up with care team as directed.   Follow up with me in 6 months   You may receive a survey regarding today's visit. I encourage you to leave honest feed back as I do use this information to improve patient care. Thank you for seeing me today!   GENERAL HEADACHE INFORMATION:   Natural supplements: Magnesium Oxide or Magnesium Glycinate 500 mg at bed (up to 800 mg daily) Coenzyme Q10 300 mg in AM Vitamin B2- 200 mg twice a day   Add 1 supplement at a time since even natural supplements can have undesirable side effects. You can sometimes buy supplements cheaper (especially Coenzyme Q10) at www.WebmailGuide.co.za or at [facility].  Migraine with aura: There is increased risk for stroke in women with migraine with aura and a contraindication for the combined contraceptive pill for use by women who have migraine with aura. The risk for women with migraine without aura is lower. However other risk factors like smoking are far more likely to increase stroke risk than migraine. There is a recommendation for no smoking and for the use of OCPs without estrogen such as progestogen only pills particularly for women with migraine with aura.Marland Kitchen People who have migraine headaches with auras may be 3 times more likely to have a stroke caused by a blood clot, compared to migraine patients who don't see auras. Women who take hormone-replacement therapy may be 30 percent more likely to suffer a clot-based stroke than women not taking medication containing estrogen. Other risk factors like smoking and high blood  pressure may be  much more important.    Vitamins and herbs that show potential:   Magnesium: Magnesium (250 mg twice a day or 500 mg at bed) has a relaxant effect on smooth muscles such as blood vessels. Individuals suffering from frequent or daily headache usually have low magnesium levels which can be increase with daily supplementation of 400-750 mg. Three trials found 40-90% average headache reduction  when used as a preventative. Magnesium may help with headaches are aura, the best evidence for magnesium is for migraine with aura is its thought to stop the cortical spreading depression we believe is the pathophysiology of migraine aura.Magnesium also demonstrated the benefit in menstrually related migraine.  Magnesium is part of the messenger system in the serotonin cascade and it is a good muscle relaxant.  It is also useful for constipation which can be a side effect of other medications used to treat migraine. Good sources include nuts, whole grains, and tomatoes. Side Effects: loose stool/diarrhea  Riboflavin (vitamin B 2) 200 mg twice a day. This vitamin assists nerve cells in the production of ATP a principal energy storing molecule.  It is necessary for many chemical reactions in the body.  There have been at least 3 clinical trials of riboflavin using 400 mg per day all of which suggested that migraine frequency can be decreased.  All 3 trials showed significant improvement in over half of migraine sufferers.  The supplement is found in bread, cereal, milk, meat, and poultry.  Most Americans get more riboflavin than the recommended daily allowance, however  riboflavin deficiency is not necessary for the supplements to help prevent headache. Side effects: energizing, green urine   Coenzyme Q10: This is present in almost all cells in the body and is critical component for the conversion of energy.  Recent studies have shown that a nutritional supplement of CoQ10 can reduce the frequency of  migraine attacks by improving the energy production of cells as with riboflavin.  Doses of 150 mg twice a day have been shown to be effective.   Melatonin: Increasing evidence shows correlation between melatonin secretion and headache conditions.  Melatonin supplementation has decreased headache intensity and duration.  It is widely used as a sleep aid.  Sleep is natures way of dealing with migraine.  A dose of 3 mg is recommended to start for headaches including cluster headache. Higher doses up to 15 mg has been reviewed for use in Cluster headache and have been used. The rationale behind using melatonin for cluster is that many theories regarding the cause of Cluster headache center around the disruption of the normal circadian rhythm in the brain.  This helps restore the normal circadian rhythm.   HEADACHE DIET: Foods and beverages which may trigger migraine Note that only 20% of headache patients are food sensitive. You will know if you are food sensitive if you get a headache consistently 20 minutes to 2 hours after eating a certain food. Only cut out a food if it causes headaches, otherwise you might remove foods you enjoy! What matters most for diet is to eat a well balanced healthy diet full of vegetables and low fat protein, and to not miss meals.   Chocolate, other sweets ALL cheeses except cottage and cream cheese Dairy products, yogurt, sour cream, ice cream Liver Meat extracts (Bovril, Marmite, meat tenderizers) Meats or fish which have undergone aging, fermenting, pickling or smoking. These include: Hotdogs,salami,Lox,sausage, mortadellas,smoked salmon, pepperoni, Pickled herring Pods of broad bean (English beans, Chinese pea pods, [place] (fava) beans, lima and navy beans Ripe avocado, ripe banana Yeast extracts or active yeast preparations such as Brewer's or Fleishman's (commercial bakes goods are permitted) Tomato based foods, pizza (lasagna, etc.)   MSG (monosodium glutamate)  is disguised as many things; look for these common aliases: Monopotassium glutamate Autolysed yeast Hydrolysed protein Sodium caseinate "flavorings" "all natural preservatives" Nutrasweet   Avoid all other foods that convincingly provoke headaches.   Resources: The Dizzy [place]air Laundry Your Headache Diet, migrainestrong.com  https://zamora-andrews.com/   Caffeine and Migraine For patients that have migraine, caffeine intake more than 3 days per week can lead to dependency and increased migraine frequency. I would recommend cutting back on your caffeine intake as best you can. The recommended amount of caffeine is 200-300 mg daily, although migraine patients may experience dependency at even lower doses. While you may notice an increase in headache temporarily, cutting back will be helpful for headaches in the long run. For more information on caffeine and migraine, visit: https://americanmigrainefoundation.org/resource-library/caffeine-and-migraine/   Headache Prevention Strategies:   1. Maintain a headache diary; learn to identify and avoid triggers.  - This can be a simple note where you log when you had a headache, associated symptoms, and medications used - There are several smartphone apps developed to help track migraines: Migraine Buddy, Migraine Monitor, Curelator N1-Headache App   Common triggers include: Emotional triggers: Emotional/Upset family or friends Emotional/Upset occupation Business reversal/success Anticipation anxiety Crisis-serious Post-crisis periodNew job/position   Physical triggers: Vacation Day Weekend Strenuous Exercise High Altitude Location New Move Menstrual Day Physical  Illness Oversleep/Not enough sleep Weather changes Light: Photophobia or light sesnitivity treatment involves a balance between desensitization and reduction in overly strong input. Use dark polarized glasses outside, but not inside.  Avoid bright or fluorescent light, but do not dim environment to the point that going into a normally lit room hurts. Consider FL-41 tint lenses, which reduce the most irritating wavelengths without blocking too much light.  These can be obtained at axonoptics.com or theraspecs.com Foods: see list above.   2. Limit use of acute treatments (over-the-counter medications, triptans, etc.) to no more than 2 days per week or 10 days per month to prevent medication overuse headache (rebound headache).     3. Follow a regular schedule (including weekends and holidays): Don't skip meals. Eat a balanced diet. 8 hours of sleep nightly. Minimize stress. Exercise 30 minutes per day. Being overweight is associated with a 5 times increased risk of chronic migraine. Keep well hydrated and drink 6-8 glasses of water per day.   4. Initiate non-pharmacologic measures at the earliest onset of your headache. Rest and quiet environment. Relax and reduce stress. Breathe2Relax is a free app that can instruct you on    some simple relaxtion and breathing techniques. Http://Dawnbuse.com is a    free website that provides teaching videos on relaxation.  Also, there are  many apps that   can be downloaded for "mindful" relaxation.  An app called YOGA NIDRA will help walk you through mindfulness. Another app called Calm can be downloaded to give you a structured mindfulness guide with daily reminders and skill development. Headspace for guided meditation Mindfulness Based Stress Reduction Online Course: www.palousemindfulness.com Cold compresses.   5. Don't wait!! Take the maximum allowable dosage of prescribed medication at the first sign of migraine.   6. Compliance:  Take prescribed medication regularly as directed and at the first sign of a migraine.   7. Communicate:  Call your physician when problems arise, especially if your headaches change, increase in frequency/severity, or become associated with neurological  symptoms (weakness, numbness, slurred speech, etc.). Proceed to emergency room if you experience new or worsening symptoms or symptoms do not resolve, if you have new neurologic symptoms or if headache is severe, or for any concerning symptom.   8. Headache/pain management therapies: Consider various complementary methods, including medication, behavioral therapy, psychological counselling, biofeedback, massage therapy, acupuncture, dry needling, and other modalities.  Such measures may reduce the need for medications. Counseling for pain management, where patients learn to function and ignore/minimize their pain, seems to work very well.   9. Recommend changing family's attention and focus away from patient's headaches. Instead, emphasize daily activities. If first question of day is 'How are your headaches/Do you have a headache today?', then patient will constantly think about headaches, thus making them worse. Goal is to re-direct attention away from headaches, toward daily activities and other distractions.   10. Helpful Websites: www.AmericanHeadacheSociety.org PatentHood.ch www.headaches.org TightMarket.nl www.achenet.org

## 2023-03-13 ENCOUNTER — Telehealth: Payer: Self-pay

## 2023-03-13 ENCOUNTER — Other Ambulatory Visit (HOSPITAL_COMMUNITY): Payer: Self-pay

## 2023-03-13 NOTE — Telephone Encounter (Signed)
Pharmacy Patient Advocate Encounter  Received notification from [facility] that Prior Authorization for Qulipta 60MG  tablets has been APPROVED from 03/13/2023 to 09/13/2023.  PA #/Case ID/Reference #: PA Case ID: [id number]

## 2023-03-13 NOTE — Telephone Encounter (Signed)
Pharmacy Patient Advocate Encounter   Received notification from CoverMyMeds that prior authorization for Qulipta 60MG  tablets is required/requested.   Insurance verification completed.   The patient is insured through [facility] .   Per test claim: PA submitted to [facility] via CoverMyMeds Key/confirmation #/[id number] Status is pending

## 2023-03-26 ENCOUNTER — Encounter: Payer: BC Managed Care – PPO | Admitting: Primary Care

## 2023-03-28 ENCOUNTER — Ambulatory Visit: Payer: BC Managed Care – PPO | Admitting: Podiatry

## 2023-04-02 ENCOUNTER — Other Ambulatory Visit: Payer: Self-pay | Admitting: Primary Care

## 2023-04-02 DIAGNOSIS — F411 Generalized anxiety disorder: Secondary | ICD-10-CM

## 2023-04-02 NOTE — Telephone Encounter (Signed)
She is overdue for general follow up/CPE, can we get her scheduled for CPE within the next 3 months? Thanks!

## 2023-04-03 NOTE — Telephone Encounter (Signed)
Lvmtcb, sent mychart message  

## 2023-04-05 ENCOUNTER — Ambulatory Visit: Payer: BC Managed Care – PPO | Admitting: Podiatry

## 2023-04-05 ENCOUNTER — Encounter: Payer: Self-pay | Admitting: Podiatry

## 2023-04-05 DIAGNOSIS — M21621 Bunionette of right foot: Secondary | ICD-10-CM | POA: Diagnosis not present

## 2023-04-05 DIAGNOSIS — M6740 Ganglion, unspecified site: Secondary | ICD-10-CM | POA: Diagnosis not present

## 2023-04-08 NOTE — Progress Notes (Signed)
Subjective:   Patient ID: [name], female   DOB: 52 y.o.   MRN: [id number]   HPI Patient presents with 2 separate problems with 1 being a lot of pain around the fifth metatarsal head right that we have done previous injection without success and also has developed some inflammation around the medial side of the plantar fascia right with patient having had history of excision of previous plantar fibroma   ROS      Objective:  Physical Exam  Neuro vascular status was found to be intact muscle strength adequate range of motion adequate with patient found to have redness inflammation prominence of the fifth metatarsal head right that is painful when pressed make shoe gear difficult.  Patient also noted to have some small areas of possible ganglionic or nodule in the medial side of the right arch that are not currently that painful but have grown recently     Assessment:  Chronic tailor's bunion deformity right that is not responded conservatively along with probability for either a ganglionic or early plantar fibroma medial side right arch     Plan:  H&P reviewed both conditions.  Due to the intensity of the discomfort I do think that the fifth met will need to be addressed and she agrees.  Recommended derotational shifting procedure of the fifth metatarsal to take all pressure off the bone structure and screw fixation.  I allowed her to read consent form for this going over alternative treatments complications and explained what would be required.  She wants surgery signed consent form we then discussed the lesions I did do a proximal nerve block I attempted to aspirate I got out a small amount of fluid but not able to get out any of the bulk of this area and may need to be corrected at the same time.  We will see how it responds prior to surgery and we can make that decision.  Scheduled for outpatient surgery in the next several weeks with all questions answered today

## 2023-04-10 ENCOUNTER — Telehealth: Payer: Self-pay | Admitting: Urology

## 2023-04-10 NOTE — Telephone Encounter (Signed)
DOS - 05/01/23  METATARSAL OSTEOTOMY 5TH RIGHT --- [id number]   [employer]   SPOKE WITH JUDY WITH [employer] AND SHE STATED THAT FOR CPT CODE 65784 NO PRIOR AUTH IS REQUIRED.  CALL REF # JUDY S. 04/10/23 AT 3:38 PM EST

## 2023-04-12 ENCOUNTER — Encounter (INDEPENDENT_AMBULATORY_CARE_PROVIDER_SITE_OTHER): Payer: Self-pay

## 2023-04-19 ENCOUNTER — Telehealth: Payer: Self-pay | Admitting: Family Medicine

## 2023-04-19 NOTE — Telephone Encounter (Signed)
LVM and sent mychart msg informing pt of need to reschedule 09/17/23 appt - NP out

## 2023-05-04 ENCOUNTER — Encounter: Payer: Self-pay | Admitting: Primary Care

## 2023-05-04 ENCOUNTER — Ambulatory Visit: Payer: BC Managed Care – PPO | Admitting: Primary Care

## 2023-05-04 ENCOUNTER — Ambulatory Visit (INDEPENDENT_AMBULATORY_CARE_PROVIDER_SITE_OTHER)
Admission: RE | Admit: 2023-05-04 | Discharge: 2023-05-04 | Disposition: A | Discharge status: Home / Self Care | Payer: BC Managed Care – PPO | Source: Ambulatory Visit | Attending: Primary Care | Admitting: Primary Care

## 2023-05-04 VITALS — BP 116/80 | HR 79 | Temp 97.3°F | Ht 65.0 in | Wt 176.0 lb

## 2023-05-04 DIAGNOSIS — M545 Low back pain, unspecified: Secondary | ICD-10-CM | POA: Diagnosis not present

## 2023-05-04 DIAGNOSIS — G8929 Other chronic pain: Secondary | ICD-10-CM | POA: Diagnosis not present

## 2023-05-04 MED ORDER — CYCLOBENZAPRINE HCL 5 MG PO TABS
5.0000 mg | ORAL_TABLET | Freq: Every evening | ORAL | 0 refills | Status: DC | PRN
Start: 2023-05-04 — End: 2023-06-01

## 2023-05-04 NOTE — Assessment & Plan Note (Signed)
No alarm signs on exam or HPI.  Repeat lumbar plain films today given prior findings of discogenic disease. Offered physical therapy for which she kindly declines.  Consider in the future.  Continue Tylenol as needed. Start cyclobenzaprine 5 mg at bedtime as needed.  She will update

## 2023-05-04 NOTE — Progress Notes (Signed)
Subjective:    Patient ID: [name], female    DOB: 09/27/1970, 52 y.o.   MRN: [id number]  Back Pain Pertinent negatives include no numbness or weakness.    [name] is a very pleasant 52 y.o. female with a history of migraines, hot flashes, epilepsy, back pain, neck pain who presents today to discuss back pain.  Her pain is located to the mid lower lumbar region with radiation up to the thoracic spine and down to the tailbone which has been chronic for greater than 3 months. Over the last 2 months her pain has progressed. Her pain is worse with prolonged sitting, standing after sitting for prolonged periods of time, sometimes with walking. She describes her pain as "a knot just sitting there".   She underwent lumbar plain films in January of 2022 which revealed mild L5-S1 discogenic disease without acute findings.   She denies injury/trauma, radiation of of pain down her lower extremities, numbness/tingling. She's been taking Tylenol with temporary improvement.   Wt Readings from Last 3 Encounters:  05/04/23 176 lb (79.8 kg)  03/12/23 174 lb 6.4 oz (79.1 kg)  02/16/23 170 lb (77.1 kg)     Review of Systems  Musculoskeletal:  Positive for back pain and myalgias.  Skin:  Negative for color change.  Neurological:  Negative for weakness and numbness.         Past Medical History:  Diagnosis Date   Allergy    Anxiety    Asthma    worse when younger   Cellulitis of face 05/05/2021   GERD (gastroesophageal reflux disease)    Migraine    1-2x/month   Persistent cough for 3 weeks or longer 10/09/2019   PONV (postoperative nausea and vomiting)    Seizure (HCC)    Vaginal itching 05/27/2020   Vertigo    no episodes for 7 yrs    Social History   Socioeconomic History   Marital status: Widowed    Spouse name: Raiford Noble   Number of children: 0   Years of education: 10th   Highest education level: 12th grade  Occupational History    Employer: [id number]   Tobacco Use   Smoking status: Former    Current packs/day: 0.00    Types: Cigarettes    Quit date: 08/28/2013    Years since quitting: 9.6   Smokeless tobacco: Never   Tobacco comments:    Quit Janurary 1st  Vaping Use   Vaping status: Never Used  Substance and Sexual Activity   Alcohol use: No    Alcohol/week: 0.0 standard drinks of alcohol   Drug use: No   Sexual activity: Not Currently    Birth control/protection: Surgical  Other Topics Concern   Not on file  Social History Narrative   Patient lives alone   No children.   Works at [employer].   Enjoys going to [employer], going to estate sells.   Right handed   Caffeine: 1 Dr. Reino Kent all day long (5-6)   Social Determinants of Health   Financial Resource Strain: Patient Declined (05/01/2023)   Overall Financial Resource Strain (CARDIA)    Difficulty of Paying Living Expenses: Patient declined  Food Insecurity: No Food Insecurity (05/01/2023)   Hunger Vital Sign    Worried About Running Out of Food in the Last Year: Never true    Ran Out of Food in the Last Year: Never true  Transportation Needs: No Transportation Needs (05/01/2023)   PRAPARE - Transportation  Lack of Transportation (Medical): No    Lack of Transportation (Non-Medical): No  Physical Activity: Unknown (05/01/2023)   Exercise Vital Sign    Days of Exercise per Week: 0 days    Minutes of Exercise per Session: Not on file  Stress: Stress Concern Present (05/01/2023)   [employer] of Occupational Health - Occupational Stress Questionnaire    Feeling of Stress : Rather much  Social Connections: Socially Isolated (05/01/2023)   Social Connection and Isolation Panel [NHANES]    Frequency of Communication with Friends and Family: Once a week    Frequency of Social Gatherings with Friends and Family: Never    Attends Religious Services: Never    Database administrator or Organizations: No    Attends Engineer, structural: Not on file    Marital Status:  Widowed  Intimate Partner Violence: Not on file    Past Surgical History:  Procedure Laterality Date   ABDOMINAL HYSTERECTOMY  2012   COLONOSCOPY     COLONOSCOPY WITH PROPOFOL N/A 04/24/2019   Procedure: COLONOSCOPY WITH PROPOFOL;  Surgeon: Pasty Spillers, MD;  Location: [facility];  Service: Endoscopy;  Laterality: N/A;   ESOPHAGOGASTRODUODENOSCOPY (EGD) WITH PROPOFOL N/A 04/24/2019   Procedure: ESOPHAGOGASTRODUODENOSCOPY (EGD) WITH PROPOFOL;  Surgeon: Pasty Spillers, MD;  Location: [facility] ENDOSCOPY;  Service: Endoscopy;  Laterality: N/A;   FOOT SURGERY Right 01/24/2022   HAND SURGERY Right 05/07/2020   trigger release   UPPER GASTROINTESTINAL ENDOSCOPY      Family History  Problem Relation Age of Onset   Hypertension Mother    Colon cancer Neg Hx    Esophageal cancer Neg Hx    Stomach cancer Neg Hx    Rectal cancer Neg Hx     Allergies  Allergen Reactions   Asa Buff (Mag [Buffered Aspirin] Other (See Comments)    Stomach pain   Aspirin Other (See Comments)   Azithromycin Other (See Comments)    GI problems   Ibuprofen Other (See Comments)    GI upset   Penicillins Other (See Comments)    Unknown childhood allergic reaction Has patient had a PCN reaction causing immediate rash, facial/tongue/throat swelling, SOB or lightheadedness with hypotension: Unknown Has patient had a PCN reaction causing severe rash involving mucus membranes or skin necrosis: Unknown Has patient had a PCN reaction that required hospitalization: Unknown Has patient had a PCN reaction occurring within the last 10 years: No If all of the above answers are "NO", then may proceed with Cephalosporin use.    Current Outpatient Medications on File Prior to Visit  Medication Sig Dispense Refill   Atogepant (QULIPTA) 60 MG TABS Take 1 tablet (60 mg total) by mouth daily. 90 tablet 3   clobetasol cream (TEMOVATE) 0.05 % Apply 1 Application topically 2 (two) times daily. 30 g 0    escitalopram (LEXAPRO) 20 MG tablet TAKE 1 TABLET BY MOUTH ONCE DAILY FOR ANXIETY 90 tablet 0   ondansetron (ZOFRAN-ODT) 4 MG disintegrating tablet Take 1 tablet (4 mg total) by mouth every 8 (eight) hours as needed for nausea or vomiting. 15 tablet 0   PHENobarbital (LUMINAL) 97.2 MG tablet Take 2 tablets (194.4 mg total) by mouth daily. 180 tablet 1   rizatriptan (MAXALT-MLT) 10 MG disintegrating tablet DISSOLVE 1 TABLET IN MOUTH ONCE DAILY AS NEEDED FOR MIGRAINE. MAY REPEAT IN 2 HOURS IF NEEDED 9 tablet 0   triamcinolone cream (KENALOG) 0.1 % Apply 1 Application topically 2 (two) times daily. (Patient not taking:  Reported on 05/04/2023) 30 g 0   No current facility-administered medications on file prior to visit.    BP 116/80   Pulse 79   Temp (!) 97.3 F (36.3 C) (Temporal)   Ht 5\' 5"  (1.651 m)   Wt 176 lb (79.8 kg)   SpO2 96%   BMI 29.29 kg/m  Objective:   Physical Exam Constitutional:      General: She is not in acute distress. Cardiovascular:     Rate and Rhythm: Normal rate.  Pulmonary:     Effort: Pulmonary effort is normal.  Musculoskeletal:     Lumbar back: No tenderness or bony tenderness. Normal range of motion. Negative right straight leg raise test and negative left straight leg raise test.       Back:     Comments: 5/5 strength to bilateral lower extremities.           Assessment & Plan:  Chronic midline low back pain without sciatica Assessment & Plan: No alarm signs on exam or HPI.  Repeat lumbar plain films today given prior findings of discogenic disease. Offered physical therapy for which she kindly declines.  Consider in the future.  Continue Tylenol as needed. Start cyclobenzaprine 5 mg at bedtime as needed.  She will update  Orders: -     Cyclobenzaprine HCl; Take 1 tablet (5 mg total) by mouth at bedtime as needed for muscle spasms.  Dispense: 20 tablet; Refill: 0 -     DG Lumbar Spine Complete        Doreene Nest, NP

## 2023-05-04 NOTE — Patient Instructions (Signed)
Complete xray(s) prior to leaving today. I will notify you of your results once received.  You may take cyclobenzaprine 5 mg tablets at bedtime for back pain.  Continue Tylenol as needed.  It was a pleasure to see you today!

## 2023-05-07 ENCOUNTER — Encounter: Payer: BC Managed Care – PPO | Admitting: Podiatry

## 2023-05-09 ENCOUNTER — Encounter: Payer: BC Managed Care – PPO | Admitting: Primary Care

## 2023-05-15 ENCOUNTER — Other Ambulatory Visit: Payer: Self-pay | Admitting: Primary Care

## 2023-05-15 DIAGNOSIS — R21 Rash and other nonspecific skin eruption: Secondary | ICD-10-CM

## 2023-05-16 DIAGNOSIS — M545 Low back pain, unspecified: Secondary | ICD-10-CM

## 2023-05-17 ENCOUNTER — Encounter: Payer: Self-pay | Admitting: Podiatry

## 2023-05-17 ENCOUNTER — Ambulatory Visit: Payer: BC Managed Care – PPO | Admitting: Podiatry

## 2023-05-17 VITALS — BP 124/74 | HR 79

## 2023-05-17 DIAGNOSIS — L600 Ingrowing nail: Secondary | ICD-10-CM

## 2023-05-17 NOTE — Patient Instructions (Signed)

## 2023-05-18 NOTE — Progress Notes (Signed)
Subjective:   Patient ID: [name], female   DOB: 52 y.o.   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  I went ahead and I allowed her to read signed consent form I infiltrated the left big toe 60 mg like marked Eksir sterile prep done using sterile instrumentation remove the big toenail exposed matrix applied phenol for applications 30 seconds followed by alcohol by sterile dressing gave instructions on soaks wear dressing 24 hours take it off earlier if throbbing were to occur.  Discussed tailor's bunion osteotomy right and she is going to talk to surgical center concerning cost

## 2023-05-21 ENCOUNTER — Encounter: Payer: BC Managed Care – PPO | Admitting: Podiatry

## 2023-05-23 ENCOUNTER — Ambulatory Visit: Payer: BC Managed Care – PPO | Admitting: Podiatry

## 2023-05-24 ENCOUNTER — Ambulatory Visit: Payer: BC Managed Care – PPO | Admitting: Primary Care

## 2023-05-24 ENCOUNTER — Encounter: Payer: Self-pay | Admitting: Primary Care

## 2023-05-24 ENCOUNTER — Other Ambulatory Visit: Payer: Self-pay | Admitting: Primary Care

## 2023-05-24 VITALS — BP 132/78 | HR 88 | Temp 97.6°F | Ht 65.0 in | Wt 183.0 lb

## 2023-05-24 DIAGNOSIS — Z1231 Encounter for screening mammogram for malignant neoplasm of breast: Secondary | ICD-10-CM

## 2023-05-24 DIAGNOSIS — H9202 Otalgia, left ear: Secondary | ICD-10-CM | POA: Diagnosis not present

## 2023-05-24 MED ORDER — PREDNISONE 20 MG PO TABS: ORAL_TABLET | ORAL | 0 refills | Status: DC

## 2023-05-24 NOTE — Patient Instructions (Signed)
Start prednisone 20 mg tablets. Take 2 tablets by mouth once daily in the morning for 5 days.  It was a pleasure to see you today!

## 2023-05-24 NOTE — Progress Notes (Signed)
Subjective:    Patient ID: [name], female    DOB: 10/05/1970, 52 y.o.   MRN: [id number]  HPI  [name] is a very pleasant 52 y.o. female with a history of headaches, asthma, GERD, fatigue who presents today to discuss otalgia.   Symptom onset two days ago with pain to the left ear. She experiences pain with chewing, swallowing, and talking.   She denies right ear pain, fevers, post nasal drip, cough. She did have a headache yesterday which abated. She tried OTC earache drops without improvement.  She has not been swimming recently.   Review of Systems  Constitutional:  Negative for chills and fever.  HENT:  Positive for ear pain. Negative for congestion, postnasal drip, rhinorrhea, sinus pain and sore throat.   Neurological:  Negative for headaches.         Past Medical History:  Diagnosis Date   Allergy    Anxiety    Asthma    worse when younger   Cellulitis of face 05/05/2021   GERD (gastroesophageal reflux disease)    Migraine    1-2x/month   Persistent cough for 3 weeks or longer 10/09/2019   PONV (postoperative nausea and vomiting)    Seizure (HCC)    Vaginal itching 05/27/2020   Vertigo    no episodes for 7 yrs    Social History   Socioeconomic History   Marital status: Widowed    Spouse name: [name]   Number of children: 0   Years of education: 10th   Highest education level: 12th grade  Occupational History    Employer: [id number]  Tobacco Use   Smoking status: Former    Current packs/day: 0.00    Types: Cigarettes    Quit date: 08/28/2013    Years since quitting: 9.7   Smokeless tobacco: Never   Tobacco comments:    Quit Janurary 1st  Vaping Use   Vaping status: Never Used  Substance and Sexual Activity   Alcohol use: No    Alcohol/week: 0.0 standard drinks of alcohol   Drug use: No   Sexual activity: Not Currently    Birth control/protection: Surgical  Other Topics Concern   Not on file  Social History Narrative    Patient lives alone   No children.   Works at [employer].   Enjoys going to [employer], going to estate sells.   Right handed   Caffeine: 1 Dr. Reino Kent all day long (5-6)   Social Determinants of Health   Financial Resource Strain: Patient Declined (05/01/2023)   Overall Financial Resource Strain (CARDIA)    Difficulty of Paying Living Expenses: Patient declined  Food Insecurity: No Food Insecurity (05/01/2023)   Hunger Vital Sign    Worried About Running Out of Food in the Last Year: Never true    Ran Out of Food in the Last Year: Never true  Transportation Needs: No Transportation Needs (05/01/2023)   PRAPARE - Administrator, Civil Service (Medical): No    Lack of Transportation (Non-Medical): No  Physical Activity: Unknown (05/01/2023)   Exercise Vital Sign    Days of Exercise per Week: 0 days    Minutes of Exercise per Session: Not on file  Stress: Stress Concern Present (05/01/2023)   [employer] of Occupational Health - Occupational Stress Questionnaire    Feeling of Stress : Rather much  Social Connections: Socially Isolated (05/01/2023)   Social Connection and Isolation Panel [NHANES]    Frequency of Communication  with Friends and Family: Once a week    Frequency of Social Gatherings with Friends and Family: Never    Attends Religious Services: Never    Database administrator or Organizations: No    Attends Engineer, structural: Not on file    Marital Status: Widowed  Intimate Partner Violence: Not on file    Past Surgical History:  Procedure Laterality Date   ABDOMINAL HYSTERECTOMY  2012   COLONOSCOPY     COLONOSCOPY WITH PROPOFOL N/A 04/24/2019   Procedure: COLONOSCOPY WITH PROPOFOL;  Surgeon: Pasty Spillers, MD;  Location: [facility];  Service: Endoscopy;  Laterality: N/A;   ESOPHAGOGASTRODUODENOSCOPY (EGD) WITH PROPOFOL N/A 04/24/2019   Procedure: ESOPHAGOGASTRODUODENOSCOPY (EGD) WITH PROPOFOL;  Surgeon: Pasty Spillers, MD;   Location: [facility];  Service: Endoscopy;  Laterality: N/A;   FOOT SURGERY Right 01/24/2022   HAND SURGERY Right 05/07/2020   trigger release   UPPER GASTROINTESTINAL ENDOSCOPY      Family History  Problem Relation Age of Onset   Hypertension Mother    Colon cancer Neg Hx    Esophageal cancer Neg Hx    Stomach cancer Neg Hx    Rectal cancer Neg Hx     Allergies  Allergen Reactions   Asa Buff (Mag [Buffered Aspirin] Other (See Comments)    Stomach pain   Aspirin Other (See Comments)   Azithromycin Other (See Comments)    GI problems   Ibuprofen Other (See Comments)    GI upset   Penicillins Other (See Comments)    Unknown childhood allergic reaction Has patient had a PCN reaction causing immediate rash, facial/tongue/throat swelling, SOB or lightheadedness with hypotension: Unknown Has patient had a PCN reaction causing severe rash involving mucus membranes or skin necrosis: Unknown Has patient had a PCN reaction that required hospitalization: Unknown Has patient had a PCN reaction occurring within the last 10 years: No If all of the above answers are "NO", then may proceed with Cephalosporin use.    Current Outpatient Medications on File Prior to Visit  Medication Sig Dispense Refill   Atogepant (QULIPTA) 60 MG TABS Take 1 tablet (60 mg total) by mouth daily. 90 tablet 3   clobetasol cream (TEMOVATE) 0.05 % APPLY CREAM TWICE DAILY 30 g 0   escitalopram (LEXAPRO) 20 MG tablet TAKE 1 TABLET BY MOUTH ONCE DAILY FOR ANXIETY 90 tablet 0   ondansetron (ZOFRAN-ODT) 4 MG disintegrating tablet Take 1 tablet (4 mg total) by mouth every 8 (eight) hours as needed for nausea or vomiting. 15 tablet 0   PHENobarbital (LUMINAL) 97.2 MG tablet Take 2 tablets (194.4 mg total) by mouth daily. 180 tablet 1   rizatriptan (MAXALT-MLT) 10 MG disintegrating tablet DISSOLVE 1 TABLET IN MOUTH ONCE DAILY AS NEEDED FOR MIGRAINE. MAY REPEAT IN 2 HOURS IF NEEDED 9 tablet 0   cyclobenzaprine  (FLEXERIL) 5 MG tablet Take 1 tablet (5 mg total) by mouth at bedtime as needed for muscle spasms. 20 tablet 0   triamcinolone cream (KENALOG) 0.1 % Apply 1 Application topically 2 (two) times daily. (Patient not taking: Reported on 05/24/2023) 30 g 0   No current facility-administered medications on file prior to visit.    BP 132/78   Pulse 88   Temp 97.6 F (36.4 C) (Temporal)   Ht 5\' 5"  (1.651 m)   Wt 183 lb (83 kg)   SpO2 97%   BMI 30.45 kg/m  Objective:   Physical Exam HENT:     Right  Ear: Ear canal and external ear normal. There is no impacted cerumen. Tympanic membrane is bulging. Tympanic membrane is not erythematous.     Left Ear: Ear canal and external ear normal. There is no impacted cerumen. Tympanic membrane is bulging. Tympanic membrane is not erythematous.     Mouth/Throat:     Mouth: Mucous membranes are moist.  Cardiovascular:     Rate and Rhythm: Normal rate and regular rhythm.  Neurological:     Mental Status: She is alert.           Assessment & Plan:  Otalgia of left ear Assessment & Plan: Exam today without infection.  Suspect symptoms are secondary to weather changes/allergies.  Recommended Flonase nasal spray, 1 spray each nostril twice daily, however she does not prefer to use nasal sprays. Start prednisone 20 mg tablets. Take 2 tablets by mouth once daily in the morning for 5 days.  Follow-up as needed.  Orders: -     predniSONE; Take 2 tablets by mouth once daily for 5 days.  Dispense: 10 tablet; Refill: 0        Doreene Nest, NP

## 2023-05-24 NOTE — Assessment & Plan Note (Signed)
Exam today without infection.  Suspect symptoms are secondary to weather changes/allergies.  Recommended Flonase nasal spray, 1 spray each nostril twice daily, however she does not prefer to use nasal sprays. Start prednisone 20 mg tablets. Take 2 tablets by mouth once daily in the morning for 5 days.  Follow-up as needed.

## 2023-06-01 ENCOUNTER — Ambulatory Visit: Payer: BC Managed Care – PPO | Admitting: Primary Care

## 2023-06-01 ENCOUNTER — Encounter: Payer: Self-pay | Admitting: Primary Care

## 2023-06-01 VITALS — BP 120/68 | HR 82 | Temp 98.6°F | Ht 65.0 in | Wt 178.0 lb

## 2023-06-01 DIAGNOSIS — Z6829 Body mass index (BMI) 29.0-29.9, adult: Secondary | ICD-10-CM | POA: Diagnosis not present

## 2023-06-01 DIAGNOSIS — E663 Overweight: Secondary | ICD-10-CM | POA: Diagnosis not present

## 2023-06-01 DIAGNOSIS — E785 Hyperlipidemia, unspecified: Secondary | ICD-10-CM

## 2023-06-01 MED ORDER — WEGOVY 0.25 MG/0.5ML ~~LOC~~ SOAJ
0.2500 mg | SUBCUTANEOUS | 0 refills | Status: DC
Start: 2023-06-01 — End: 2023-08-13

## 2023-06-01 NOTE — Progress Notes (Signed)
Subjective:    Patient ID: [name], female    DOB: 12/24/1970, 52 y.o.   MRN: [id number]  HPI  [name] is a very pleasant 52 y.o. female with a history of migraines, asthma, seizure, GAD, chronic back pain who presents today to discuss her weight.  She would like to start Ozempic to help her lose weight.   Weight fluctuation over the years from 170-180 pounds despite changes in her diet. She has not tried weight watches, calorie counting.    Diet currently consists of:  Breakfast: Skips mostly Lunch: Restaurant food, skips Dinner: Protein, pasta, restaurant food, steak Snacks: None Desserts: Honey Bun, once weekly  Beverages: Water, Regular Gatorade   Exercise: None   Wt Readings from Last 3 Encounters:  06/01/23 178 lb (80.7 kg)  05/24/23 183 lb (83 kg)  05/04/23 176 lb (79.8 kg)   Body mass index is 29.62 kg/m.      Review of Systems  Respiratory:  Negative for shortness of breath.   Cardiovascular:  Negative for chest pain.  Gastrointestinal:  Negative for constipation.         Past Medical History:  Diagnosis Date   Allergy    Anxiety    Asthma    worse when younger   Cellulitis of face 05/05/2021   GERD (gastroesophageal reflux disease)    Migraine    1-2x/month   Persistent cough for 3 weeks or longer 10/09/2019   PONV (postoperative nausea and vomiting)    Seizure (HCC)    Vaginal itching 05/27/2020   Vertigo    no episodes for 7 yrs    Social History   Socioeconomic History   Marital status: Widowed    Spouse name: Raiford Noble   Number of children: 0   Years of education: 10th   Highest education level: 12th grade  Occupational History    Employer: [id number]  Tobacco Use   Smoking status: Former    Current packs/day: 0.00    Types: Cigarettes    Quit date: 08/28/2013    Years since quitting: 9.7   Smokeless tobacco: Never   Tobacco comments:    Quit Janurary 1st  Vaping Use   Vaping status: Never Used   Substance and Sexual Activity   Alcohol use: No    Alcohol/week: 0.0 standard drinks of alcohol   Drug use: No   Sexual activity: Not Currently    Birth control/protection: Surgical  Other Topics Concern   Not on file  Social History Narrative   Patient lives alone   No children.   Works at [employer].   Enjoys going to [employer], going to estate sells.   Right handed   Caffeine: 1 Dr. Reino Kent all day long (5-6)   Social Determinants of Health   Financial Resource Strain: Patient Declined (05/01/2023)   Overall Financial Resource Strain (CARDIA)    Difficulty of Paying Living Expenses: Patient declined  Food Insecurity: No Food Insecurity (05/01/2023)   Hunger Vital Sign    Worried About Running Out of Food in the Last Year: Never true    Ran Out of Food in the Last Year: Never true  Transportation Needs: No Transportation Needs (05/01/2023)   PRAPARE - Administrator, Civil Service (Medical): No    Lack of Transportation (Non-Medical): No  Physical Activity: Unknown (05/01/2023)   Exercise Vital Sign    Days of Exercise per Week: 0 days    Minutes of Exercise per Session: Not on  file  Stress: Stress Concern Present (05/01/2023)   [employer] of Occupational Health - Occupational Stress Questionnaire    Feeling of Stress : Rather much  Social Connections: Socially Isolated (05/01/2023)   Social Connection and Isolation Panel [NHANES]    Frequency of Communication with Friends and Family: Once a week    Frequency of Social Gatherings with Friends and Family: Never    Attends Religious Services: Never    Database administrator or Organizations: No    Attends Engineer, structural: Not on file    Marital Status: Widowed  Intimate Partner Violence: Not on file    Past Surgical History:  Procedure Laterality Date   ABDOMINAL HYSTERECTOMY  2012   COLONOSCOPY     COLONOSCOPY WITH PROPOFOL N/A 04/24/2019   Procedure: COLONOSCOPY WITH PROPOFOL;  Surgeon:  Pasty Spillers, MD;  Location: [facility];  Service: Endoscopy;  Laterality: N/A;   ESOPHAGOGASTRODUODENOSCOPY (EGD) WITH PROPOFOL N/A 04/24/2019   Procedure: ESOPHAGOGASTRODUODENOSCOPY (EGD) WITH PROPOFOL;  Surgeon: Pasty Spillers, MD;  Location: [facility];  Service: Endoscopy;  Laterality: N/A;   FOOT SURGERY Right 01/24/2022   HAND SURGERY Right 05/07/2020   trigger release   UPPER GASTROINTESTINAL ENDOSCOPY      Family History  Problem Relation Age of Onset   Hypertension Mother    Colon cancer Neg Hx    Esophageal cancer Neg Hx    Stomach cancer Neg Hx    Rectal cancer Neg Hx     Allergies  Allergen Reactions   Asa Buff (Mag [Buffered Aspirin] Other (See Comments)    Stomach pain   Aspirin Other (See Comments)   Azithromycin Other (See Comments)    GI problems   Ibuprofen Other (See Comments)    GI upset   Penicillins Other (See Comments)    Unknown childhood allergic reaction Has patient had a PCN reaction causing immediate rash, facial/tongue/throat swelling, SOB or lightheadedness with hypotension: Unknown Has patient had a PCN reaction causing severe rash involving mucus membranes or skin necrosis: Unknown Has patient had a PCN reaction that required hospitalization: Unknown Has patient had a PCN reaction occurring within the last 10 years: No If all of the above answers are "NO", then may proceed with Cephalosporin use.    Current Outpatient Medications on File Prior to Visit  Medication Sig Dispense Refill   Atogepant (QULIPTA) 60 MG TABS Take 1 tablet (60 mg total) by mouth daily. 90 tablet 3   clobetasol cream (TEMOVATE) 0.05 % APPLY CREAM TWICE DAILY 30 g 0   escitalopram (LEXAPRO) 20 MG tablet TAKE 1 TABLET BY MOUTH ONCE DAILY FOR ANXIETY 90 tablet 0   ondansetron (ZOFRAN-ODT) 4 MG disintegrating tablet Take 1 tablet (4 mg total) by mouth every 8 (eight) hours as needed for nausea or vomiting. 15 tablet 0   PHENobarbital (LUMINAL) 97.2 MG  tablet Take 2 tablets (194.4 mg total) by mouth daily. 180 tablet 1   predniSONE (DELTASONE) 20 MG tablet Take 2 tablets by mouth once daily for 5 days. 10 tablet 0   rizatriptan (MAXALT-MLT) 10 MG disintegrating tablet DISSOLVE 1 TABLET IN MOUTH ONCE DAILY AS NEEDED FOR MIGRAINE. MAY REPEAT IN 2 HOURS IF NEEDED 9 tablet 0   triamcinolone cream (KENALOG) 0.1 % Apply 1 Application topically 2 (two) times daily. 30 g 0   No current facility-administered medications on file prior to visit.    BP 120/68   Pulse 82   Temp 98.6 F (37 C) (  Oral)   Ht 5\' 5"  (1.651 m)   Wt 178 lb (80.7 kg)   SpO2 97%   BMI 29.62 kg/m  Objective:   Physical Exam Cardiovascular:     Rate and Rhythm: Normal rate and regular rhythm.  Pulmonary:     Effort: Pulmonary effort is normal.     Breath sounds: Normal breath sounds.  Musculoskeletal:     Cervical back: Neck supple.  Skin:    General: Skin is warm and dry.  Neurological:     Mental Status: She is alert and oriented to person, place, and time.  Psychiatric:        Mood and Affect: Mood normal.           Assessment & Plan:  Overweight with body mass index (BMI) of 29 to 29.9 in adult Assessment & Plan: Poor diet, no regular physical exercise.  Discussed to stop eating restaurant food. Increase veggies and fruit.  Agree to prescribe Wegovy for weight loss. Discussed potential side effects, instructions for administration.  Start semaglutide (Ozempic) for diabetes/weight loss. Start by injecting 0.25 mg into the skin once weekly for 4 weeks, then increase to 0.5 mg once weekly thereafter. Please notify me once you've used your last 0.25 mg pen so that I can prescribe the next dose.   Follow up in 3 months.   Orders: -     Wegovy; Inject 0.25 mg into the skin once a week.  Dispense: 2 mL; Refill: 0  Hyperlipidemia, unspecified hyperlipidemia type -     ZOXWRU; Inject 0.25 mg into the skin once a week.  Dispense: 2 mL; Refill:  0        Doreene Nest, NP

## 2023-06-01 NOTE — Assessment & Plan Note (Signed)
Poor diet, no regular physical exercise.  Discussed to stop eating restaurant food. Increase veggies and fruit.  Agree to prescribe Wegovy for weight loss. Discussed potential side effects, instructions for administration.  Start semaglutide (Ozempic) for diabetes/weight loss. Start by injecting 0.25 mg into the skin once weekly for 4 weeks, then increase to 0.5 mg once weekly thereafter. Please notify me once you've used your last 0.25 mg pen so that I can prescribe the next dose.   Follow up in 3 months.

## 2023-06-01 NOTE — Patient Instructions (Signed)
Start semaglutide [facility]) for diabetes/weight loss. Start by injecting 0.25 mg into the skin once weekly for 4 weeks, then increase to 0.5 mg once weekly thereafter. Please notify me once you've used your last 0.25 mg pen so that I can prescribe the next dose.   It was a pleasure to see you today!

## 2023-06-08 DIAGNOSIS — Z6829 Body mass index (BMI) 29.0-29.9, adult: Secondary | ICD-10-CM

## 2023-06-11 ENCOUNTER — Other Ambulatory Visit (HOSPITAL_COMMUNITY): Payer: Self-pay

## 2023-06-11 ENCOUNTER — Telehealth: Payer: Self-pay

## 2023-06-11 NOTE — Telephone Encounter (Signed)
Pharmacy Patient Advocate Encounter  Received notification from [facility] that Prior Authorization for [facility] has been DENIED.  Full denial letter will be uploaded to the media tab. See denial reason below.      PA #/Case ID/Reference #: (Key: [id number])

## 2023-06-12 ENCOUNTER — Ambulatory Visit
Admission: RE | Admit: 2023-06-12 | Discharge: 2023-06-12 | Disposition: A | Discharge status: Home / Self Care | Payer: BC Managed Care – PPO | Source: Ambulatory Visit | Attending: Primary Care

## 2023-06-12 DIAGNOSIS — Z1231 Encounter for screening mammogram for malignant neoplasm of breast: Secondary | ICD-10-CM | POA: Diagnosis not present

## 2023-06-12 NOTE — Telephone Encounter (Signed)
Unable to reach patient. Left voicemail advising patient of denial and that I sent MyChart message with this info as well.

## 2023-06-12 NOTE — Telephone Encounter (Signed)
Can we notify patient.

## 2023-06-13 ENCOUNTER — Other Ambulatory Visit: Payer: Self-pay | Admitting: Diagnostic Neuroimaging

## 2023-06-13 ENCOUNTER — Other Ambulatory Visit: Payer: Self-pay | Admitting: Primary Care

## 2023-06-13 DIAGNOSIS — G43009 Migraine without aura, not intractable, without status migrainosus: Secondary | ICD-10-CM

## 2023-06-13 DIAGNOSIS — U071 COVID-19: Secondary | ICD-10-CM

## 2023-06-13 MED ORDER — ONDANSETRON 4 MG PO TBDP: 4.0000 mg | ORAL_TABLET | Freq: Three times a day (TID) | ORAL | 0 refills | Status: AC | PRN

## 2023-07-02 ENCOUNTER — Other Ambulatory Visit: Payer: Self-pay | Admitting: Primary Care

## 2023-07-02 DIAGNOSIS — F411 Generalized anxiety disorder: Secondary | ICD-10-CM

## 2023-07-02 NOTE — Telephone Encounter (Signed)
Patient is due for CPE/follow up. Can we get her scheduled before the end of the year?

## 2023-07-03 NOTE — Telephone Encounter (Signed)
Lvm for patient tcb and schedule 

## 2023-08-13 ENCOUNTER — Ambulatory Visit: Payer: BC Managed Care – PPO | Admitting: Internal Medicine

## 2023-08-13 ENCOUNTER — Encounter: Payer: Self-pay | Admitting: Internal Medicine

## 2023-08-13 VITALS — BP 128/80 | HR 100 | Temp 100.3°F | Resp 24 | Ht 65.0 in | Wt 178.0 lb

## 2023-08-13 DIAGNOSIS — J22 Unspecified acute lower respiratory infection: Secondary | ICD-10-CM | POA: Insufficient documentation

## 2023-08-13 MED ORDER — BENZONATATE 200 MG PO CAPS: 200.0000 mg | ORAL_CAPSULE | Freq: Three times a day (TID) | ORAL | 0 refills | Status: DC | PRN

## 2023-08-13 MED ORDER — DOXYCYCLINE HYCLATE 100 MG PO TABS: 100.0000 mg | ORAL_TABLET | Freq: Two times a day (BID) | ORAL | 0 refills | Status: DC

## 2023-08-13 NOTE — Assessment & Plan Note (Signed)
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

## 2023-08-13 NOTE — Progress Notes (Signed)
Subjective:    Patient ID: [name], female    DOB: November 18, 1970, 52 y.o.   MRN: [id number]  HPI Here for respiratory illness  Started 8 days ago Sore throat Ears "feel like they are going to explode" Bad runny nose Some chest pain---?from cough Lost voice in the past few days May have had fever on day 2--and some chills Some SOB  Tried claritin D, nyquil, started flonase yesterday, zyrtec No clear help from these  Tylenol for headache---"all over"  Current Outpatient Medications on File Prior to Visit  Medication Sig Dispense Refill   Atogepant (QULIPTA) 60 MG TABS Take 1 tablet (60 mg total) by mouth daily. 90 tablet 3   clobetasol cream (TEMOVATE) 0.05 % APPLY CREAM TWICE DAILY 30 g 0   escitalopram (LEXAPRO) 20 MG tablet TAKE 1 TABLET BY MOUTH ONCE DAILY FOR ANXIETY 90 tablet 0   ondansetron (ZOFRAN-ODT) 4 MG disintegrating tablet Take 1 tablet (4 mg total) by mouth every 8 (eight) hours as needed for nausea or vomiting. 15 tablet 0   PHENobarbital (LUMINAL) 97.2 MG tablet Take 2 tablets (194.4 mg total) by mouth daily. 180 tablet 1   rizatriptan (MAXALT-MLT) 10 MG disintegrating tablet DISSOLVE 1 TABLET IN MOUTH ONCE DAILY AS NEEDED FOR MIGRAINE. MAY REPEAT IN 2 HOURS IF NEEDED 9 tablet 0   triamcinolone cream (KENALOG) 0.1 % Apply 1 Application topically 2 (two) times daily. 30 g 0   No current facility-administered medications on file prior to visit.    Allergies  Allergen Reactions   Asa Buff (Mag [Buffered Aspirin] Other (See Comments)    Stomach pain   Aspirin Other (See Comments)   Azithromycin Other (See Comments)    GI problems   Ibuprofen Other (See Comments)    GI upset   Penicillins Other (See Comments)    Unknown childhood allergic reaction Has patient had a PCN reaction causing immediate rash, facial/tongue/throat swelling, SOB or lightheadedness with hypotension: Unknown Has patient had a PCN reaction causing severe rash involving mucus  membranes or skin necrosis: Unknown Has patient had a PCN reaction that required hospitalization: Unknown Has patient had a PCN reaction occurring within the last 10 years: No If all of the above answers are "NO", then may proceed with Cephalosporin use.    Past Medical History:  Diagnosis Date   Allergy    Anxiety    Asthma    worse when younger   Cellulitis of face 05/05/2021   GERD (gastroesophageal reflux disease)    Migraine    1-2x/month   Persistent cough for 3 weeks or longer 10/09/2019   PONV (postoperative nausea and vomiting)    Seizure (HCC)    Vaginal itching 05/27/2020   Vertigo    no episodes for 7 yrs    Past Surgical History:  Procedure Laterality Date   ABDOMINAL HYSTERECTOMY  2012   COLONOSCOPY     COLONOSCOPY WITH PROPOFOL N/A 04/24/2019   Procedure: COLONOSCOPY WITH PROPOFOL;  Surgeon: Pasty Spillers, MD;  Location: [facility];  Service: Endoscopy;  Laterality: N/A;   ESOPHAGOGASTRODUODENOSCOPY (EGD) WITH PROPOFOL N/A 04/24/2019   Procedure: ESOPHAGOGASTRODUODENOSCOPY (EGD) WITH PROPOFOL;  Surgeon: Pasty Spillers, MD;  Location: [facility];  Service: Endoscopy;  Laterality: N/A;   FOOT SURGERY Right 01/24/2022   HAND SURGERY Right 05/07/2020   trigger release   UPPER GASTROINTESTINAL ENDOSCOPY      Family History  Problem Relation Age of Onset   Hypertension Mother  Colon cancer Neg Hx    Esophageal cancer Neg Hx    Stomach cancer Neg Hx    Rectal cancer Neg Hx     Social History   Socioeconomic History   Marital status: Widowed    Spouse name: Raiford Noble   Number of children: 0   Years of education: 10th   Highest education level: 12th grade  Occupational History    Employer: [email or web address]  Tobacco Use   Smoking status: Former    Current packs/day: 0.00    Types: Cigarettes    Quit date: 08/28/2013    Years since quitting: 9.9   Smokeless tobacco: Never   Tobacco comments:    Quit Janurary 1st  Vaping Use   Vaping status:  Never Used  Substance and Sexual Activity   Alcohol use: No    Alcohol/week: 0.0 standard drinks of alcohol   Drug use: No   Sexual activity: Not Currently    Birth control/protection: Surgical  Other Topics Concern   Not on file  Social History Narrative   Patient lives alone   No children.   Works at [employer].   Enjoys going to [employer], going to estate sells.   Right handed   Caffeine: 1 Dr. Reino Kent all day long (5-6)   Social Drivers of Health   Financial Resource Strain: Patient Declined (05/01/2023)   Overall Financial Resource Strain (CARDIA)    Difficulty of Paying Living Expenses: Patient declined  Food Insecurity: No Food Insecurity (05/01/2023)   Hunger Vital Sign    Worried About Running Out of Food in the Last Year: Never true    Ran Out of Food in the Last Year: Never true  Transportation Needs: No Transportation Needs (05/01/2023)   PRAPARE - Administrator, Civil Service (Medical): No    Lack of Transportation (Non-Medical): No  Physical Activity: Unknown (05/01/2023)   Exercise Vital Sign    Days of Exercise per Week: 0 days    Minutes of Exercise per Session: Not on file  Stress: Stress Concern Present (05/01/2023)   [employer] of Occupational Health - Occupational Stress Questionnaire    Feeling of Stress : Rather much  Social Connections: Socially Isolated (05/01/2023)   Social Connection and Isolation Panel [NHANES]    Frequency of Communication with Friends and Family: Once a week    Frequency of Social Gatherings with Friends and Family: Never    Attends Religious Services: Never    Database administrator or Organizations: No    Attends Engineer, structural: Not on file    Marital Status: Widowed  Catering manager Violence: Not on file   Review of Systems No N/V Appetite is off but able to eat    Objective:   Physical Exam Constitutional:      Appearance: Normal appearance.     Comments: Very hoarse  HENT:     Head:      Comments: No sig sinus tenderness    Right Ear: Tympanic membrane and ear canal normal.     Left Ear: Tympanic membrane and ear canal normal.     Mouth/Throat:     Pharynx: No oropharyngeal exudate or posterior oropharyngeal erythema.  Neck:     Comments: Mildly tender anterior cervical nodes Pulmonary:     Effort: Pulmonary effort is normal.     Breath sounds: Normal breath sounds. No wheezing or rales.     Comments: Coarse cough Musculoskeletal:     Cervical back: Neck supple.  Neurological:     Mental Status: She is alert.            Assessment & Plan:

## 2023-08-25 ENCOUNTER — Other Ambulatory Visit: Payer: Self-pay | Admitting: Internal Medicine

## 2023-08-26 ENCOUNTER — Other Ambulatory Visit: Payer: Self-pay | Admitting: Primary Care

## 2023-08-26 DIAGNOSIS — U071 COVID-19: Secondary | ICD-10-CM

## 2023-09-07 ENCOUNTER — Encounter: Payer: BC Managed Care – PPO | Admitting: Primary Care

## 2023-09-17 ENCOUNTER — Ambulatory Visit: Payer: BC Managed Care – PPO | Admitting: Family Medicine

## 2023-09-18 ENCOUNTER — Encounter: Payer: BC Managed Care – PPO | Admitting: Primary Care

## 2023-09-28 ENCOUNTER — Other Ambulatory Visit: Payer: Self-pay | Admitting: Primary Care

## 2023-09-28 DIAGNOSIS — F411 Generalized anxiety disorder: Secondary | ICD-10-CM

## 2023-09-28 IMAGING — MG MM DIGITAL SCREENING BILAT W/ TOMO AND CAD
8 series · 8 of 24 positions shown · non-contrast
Comparison: Previous exam(s).

CLINICAL DATA: Screening.

EXAM:
DIGITAL SCREENING BILATERAL MAMMOGRAM WITH TOMOSYNTHESIS AND CAD
TECHNIQUE: Bilateral screening digital craniocaudal and mediolateral oblique
mammograms were obtained. Bilateral screening digital breast
tomosynthesis was performed. The images were evaluated with
computer-aided detection.

[L MLO synth-2D]
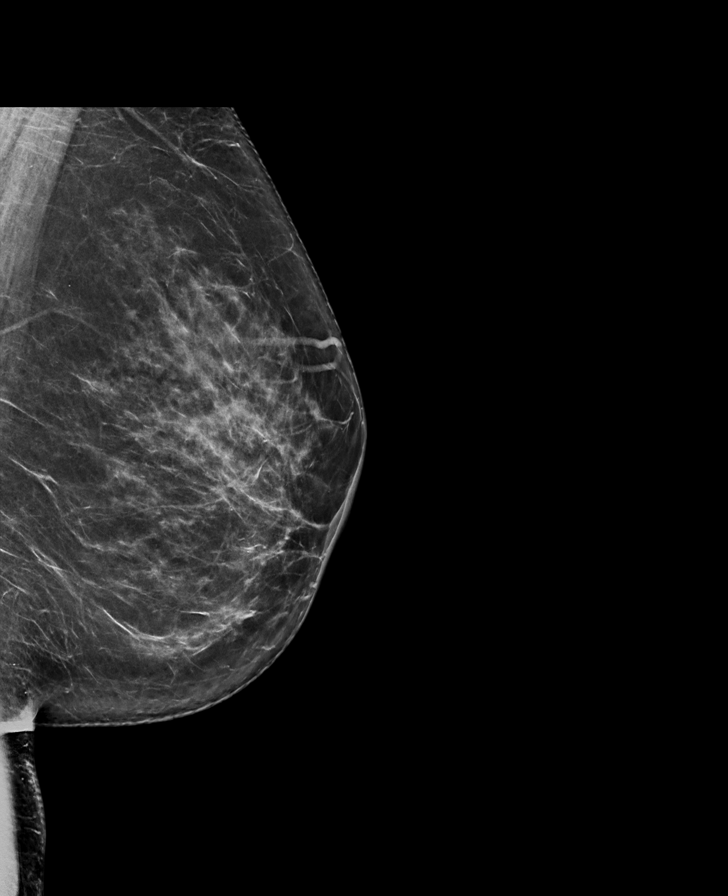

[R MLO synth-2D]
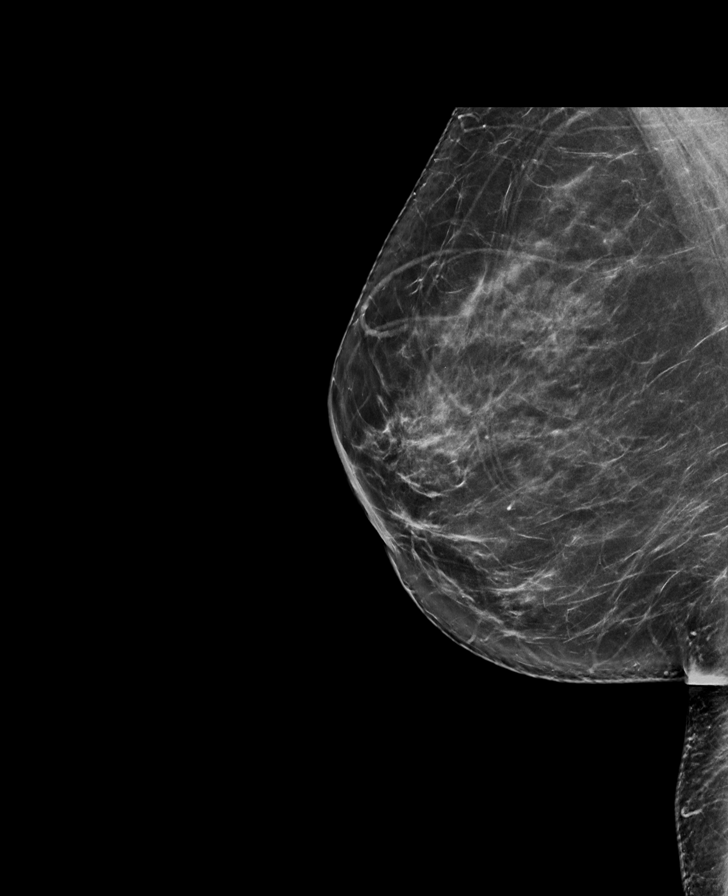

[R CC synth-2D]
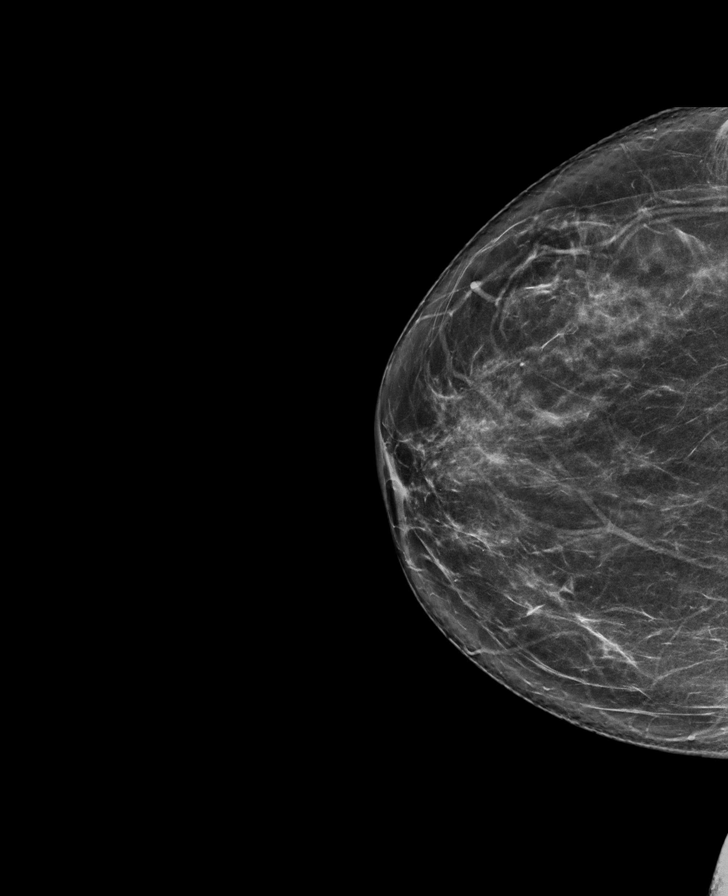

[L CC synth-2D]
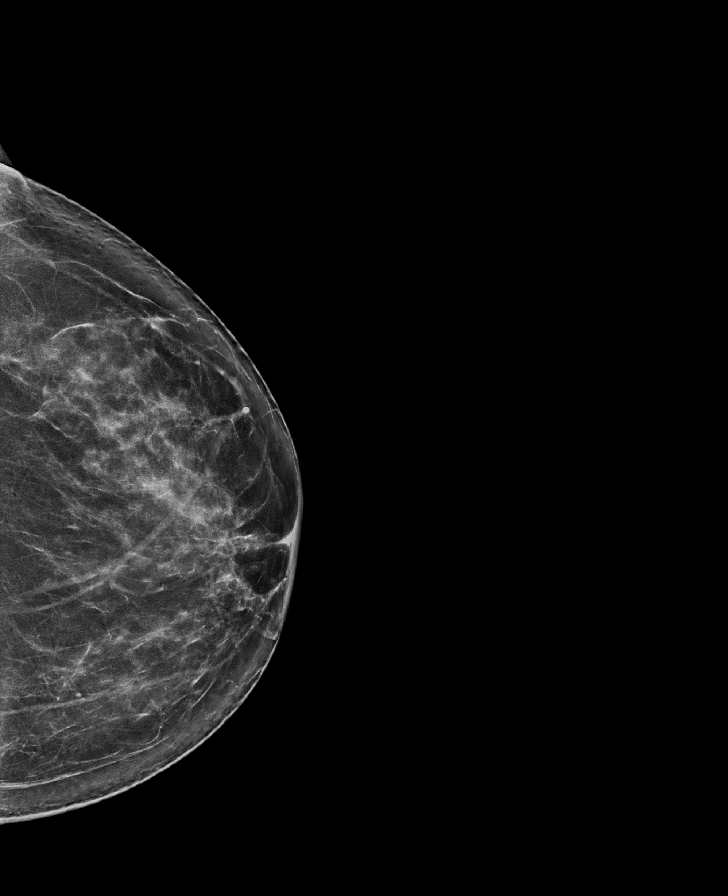

[L MLO tomo · tomo slice 39/77.0]
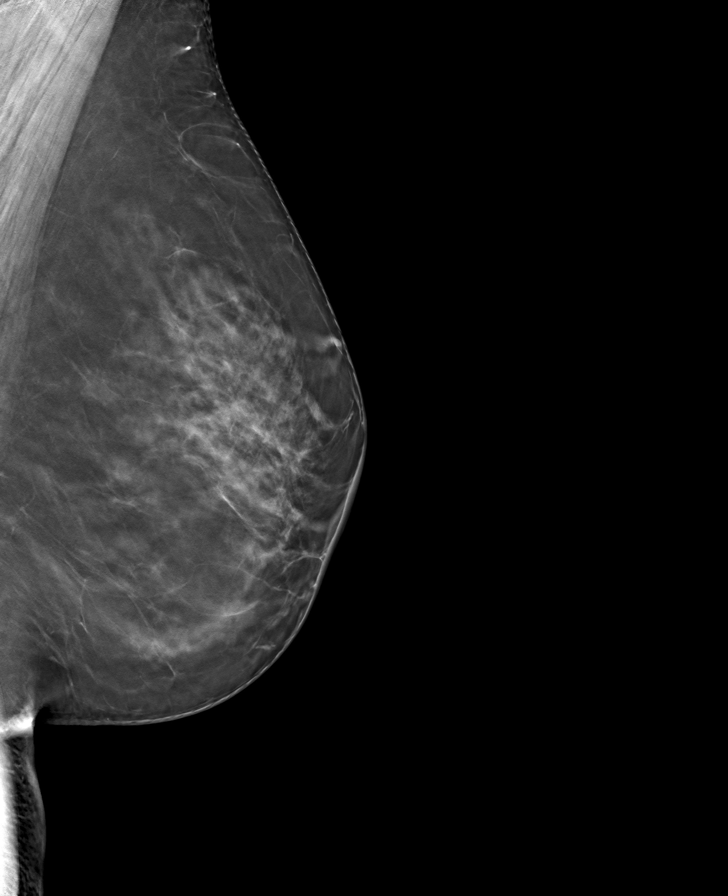

[R CC tomo · tomo slice 41/81.0]
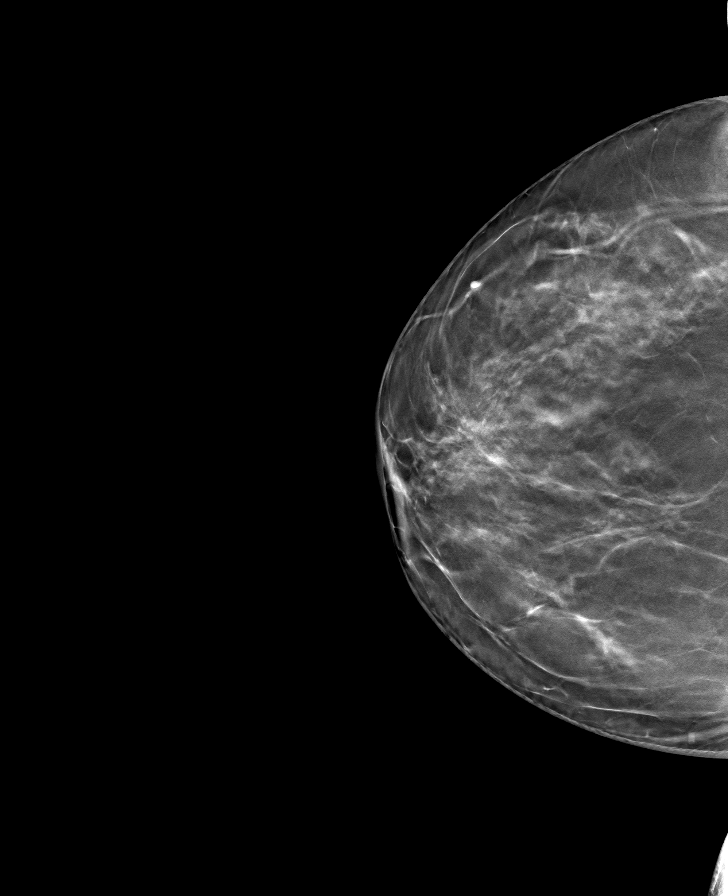

[L CC tomo · tomo slice 42/83.0]
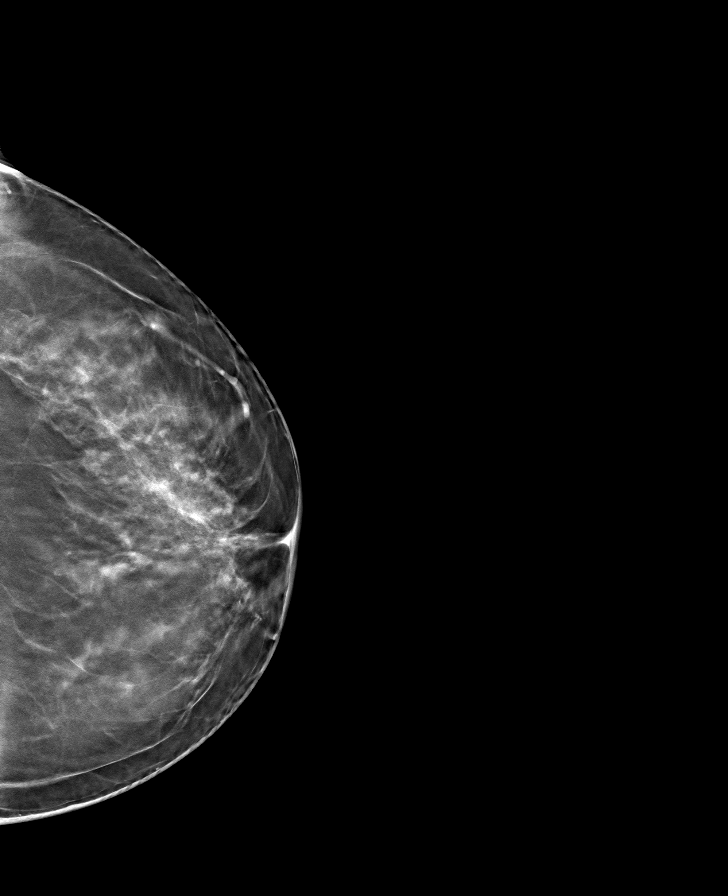

[R MLO tomo · tomo slice 41/82.0]
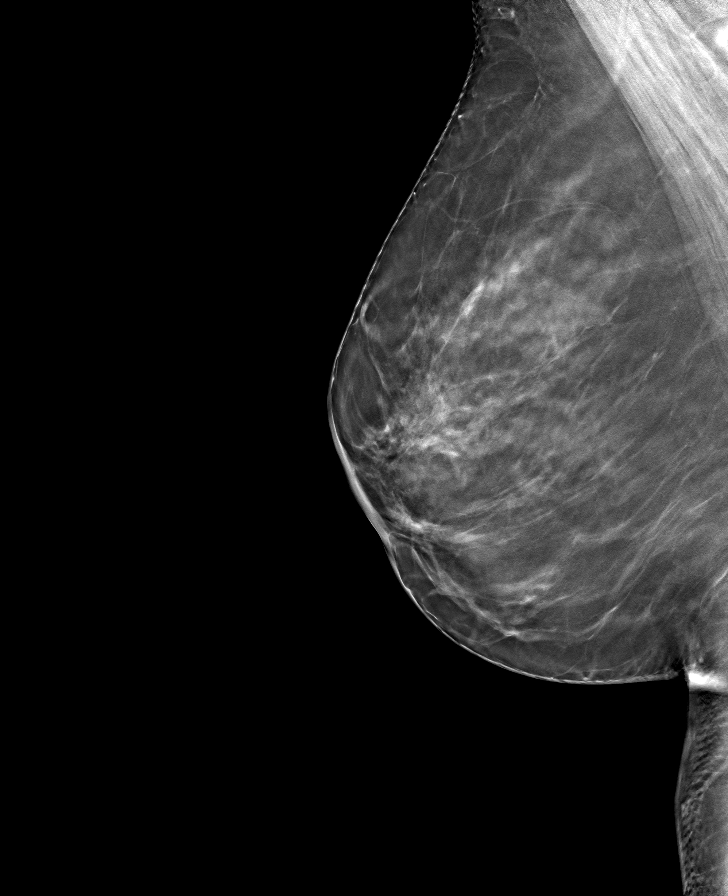

[8 of 24 positions shown; findings below may reference images not displayed]

ACR Breast Density Category b: There are scattered areas of
fibroglandular density.
FINDINGS: There are no findings suspicious for malignancy.
IMPRESSION: No mammographic evidence of malignancy. A result letter of this
screening mammogram will be mailed directly to the patient.

RECOMMENDATION:
Screening mammogram in one year. (Code:51-O-LD2)

BI-RADS CATEGORY  1: Negative.

## 2023-10-02 ENCOUNTER — Telehealth: Payer: Self-pay | Admitting: Pharmacy Technician

## 2023-10-02 ENCOUNTER — Encounter: Payer: Self-pay | Admitting: Primary Care

## 2023-10-02 ENCOUNTER — Ambulatory Visit: Payer: BC Managed Care – PPO | Admitting: Primary Care

## 2023-10-02 ENCOUNTER — Other Ambulatory Visit (HOSPITAL_COMMUNITY): Payer: Self-pay

## 2023-10-02 VITALS — BP 124/80 | HR 70 | Temp 97.7°F | Ht 65.0 in | Wt 181.0 lb

## 2023-10-02 DIAGNOSIS — K5901 Slow transit constipation: Secondary | ICD-10-CM

## 2023-10-02 DIAGNOSIS — R32 Unspecified urinary incontinence: Secondary | ICD-10-CM | POA: Insufficient documentation

## 2023-10-02 DIAGNOSIS — E538 Deficiency of other specified B group vitamins: Secondary | ICD-10-CM | POA: Diagnosis not present

## 2023-10-02 DIAGNOSIS — J4520 Mild intermittent asthma, uncomplicated: Secondary | ICD-10-CM

## 2023-10-02 DIAGNOSIS — Z0001 Encounter for general adult medical examination with abnormal findings: Secondary | ICD-10-CM

## 2023-10-02 DIAGNOSIS — F411 Generalized anxiety disorder: Secondary | ICD-10-CM

## 2023-10-02 DIAGNOSIS — E785 Hyperlipidemia, unspecified: Secondary | ICD-10-CM

## 2023-10-02 DIAGNOSIS — R053 Chronic cough: Secondary | ICD-10-CM | POA: Diagnosis not present

## 2023-10-02 DIAGNOSIS — N3946 Mixed incontinence: Secondary | ICD-10-CM | POA: Diagnosis not present

## 2023-10-02 DIAGNOSIS — G40B09 Juvenile myoclonic epilepsy, not intractable, without status epilepticus: Secondary | ICD-10-CM

## 2023-10-02 DIAGNOSIS — G43009 Migraine without aura, not intractable, without status migrainosus: Secondary | ICD-10-CM | POA: Diagnosis not present

## 2023-10-02 DIAGNOSIS — E559 Vitamin D deficiency, unspecified: Secondary | ICD-10-CM

## 2023-10-02 DIAGNOSIS — Z1211 Encounter for screening for malignant neoplasm of colon: Secondary | ICD-10-CM

## 2023-10-02 LAB — POC URINALSYSI DIPSTICK (AUTOMATED)
Bilirubin, UA: NEGATIVE
Blood, UA: NEGATIVE
Glucose, UA: NEGATIVE
Ketones, UA: NEGATIVE
Leukocytes, UA: NEGATIVE
Nitrite, UA: NEGATIVE
Protein, UA: NEGATIVE
Spec Grav, UA: 1.01 (ref 1.010–1.025)
Urobilinogen, UA: 0.2 U/dL
pH, UA: 5.5 (ref 5.0–8.0)

## 2023-10-02 MED ORDER — ALBUTEROL SULFATE HFA 108 (90 BASE) MCG/ACT IN AERS: 2.0000 | INHALATION_SPRAY | Freq: Four times a day (QID) | RESPIRATORY_TRACT | 0 refills | Status: DC | PRN

## 2023-10-02 MED ORDER — OMEPRAZOLE 20 MG PO CPDR: 20.0000 mg | DELAYED_RELEASE_CAPSULE | Freq: Every day | ORAL | 0 refills | Status: DC

## 2023-10-02 NOTE — Assessment & Plan Note (Signed)
Controlled.  Following with urology, office notes reviewed from July 2024. Continue phenobarbital 194.4 mg twice daily

## 2023-10-02 NOTE — Assessment & Plan Note (Signed)
Repeat lipid panel pending today. 

## 2023-10-02 NOTE — Assessment & Plan Note (Signed)
Stable per patient.  Numerous inhalers have been cost prohibitive. Rx for Proair inhaler sent to pharmacy.   She will update if this is cost prohibitive.

## 2023-10-02 NOTE — Assessment & Plan Note (Signed)
Improved!  Continue Maxalt 10 mg as needed. Reviewed neurology notes from July 2024.

## 2023-10-02 NOTE — Assessment & Plan Note (Signed)
Chronic and continued.  Recommended GI for follow up.  Referral placed to GI.

## 2023-10-02 NOTE — Assessment & Plan Note (Signed)
Currently on supplementation. Vitamin B12 level pending.

## 2023-10-02 NOTE — Assessment & Plan Note (Signed)
Urinalysis today negative.  Discussed treatment options including Kegel exercises, pelvic floor physical therapy, medication. She will start with Kegel exercises, handout provided today.  She will update.

## 2023-10-02 NOTE — Progress Notes (Signed)
 Subjective:    Patient ID: [name], female    DOB: 1970-12-30, 53 y.o.   MRN: [id number]  HPI  [name] is a very pleasant 53 y.o. female who presents today for complete physical and follow up of chronic conditions.  She would also like to discuss persistent cough. Acute for the last 2 months with initial symptom onset of ear pain, fevers, cough, chest congestion.  Evaluated by Dr. Jimmy, treated with Doxycycline  100 BID 7 days and Tessalon  Perles. Since then she's continued to experience a dry cough, sometimes shortness of breath.  Other symptoms have resolved.  She has a history of asthma, does not have a maintenance or rescue inhaler due to cost or ineffectiveness.  She denies wheezing, chest tightness.  She has noticed nausea a few times when waking.  She continues to experience chronic constipation, sometimes will not have a bowel movement for 1-2 months. She has tried Miralax and Colace without improvement.  Evaluated by GI previously, she is overdue for colonoscopy as of September 2023. Has not completed as she cannot tolerate the prep.   Symptom onset 3 weeks ago with urinary urgency and dysuria since completing Doxycycline  antibiotics. She's been taking AZO intermittently, last dose was over 1 week ago.  Symptoms will improve intermittently.  She denies hematuria, fevers, flank pain. She's noticed urinary incontinence with coughing, lifting heavy objects.  History of hysterectomy.  Immunizations: -Tetanus: Completed in 2021 -Influenza: Declines influenza vaccine.  -Shingles: Never completed, declines.  Diet: Fair diet.  Exercise: No regular exercise.  Eye exam: Completes annually  Dental exam: Completes semi-annually    Pap Smear: Hysterectomy Mammogram: October 2024  Colonoscopy: Completed in 2022, due September 2023 and has yet to complete. She cannot tolerate the prep.  BP Readings from Last 3 Encounters:  10/02/23 124/80  08/13/23 128/80   06/01/23 120/68       Review of Systems  Constitutional:  Negative for chills, fever and unexpected weight change.  HENT:  Negative for rhinorrhea.   Respiratory:  Positive for cough. Negative for shortness of breath.   Cardiovascular:  Negative for chest pain.  Gastrointestinal:  Positive for constipation. Negative for diarrhea.  Genitourinary:  Positive for dysuria and urgency. Negative for difficulty urinating, flank pain and hematuria.       Urinary incontinence  Musculoskeletal:  Negative for arthralgias and myalgias.  Skin:  Negative for rash.  Allergic/Immunologic: Negative for environmental allergies.  Neurological:  Negative for dizziness, numbness and headaches.  Psychiatric/Behavioral:  The patient is not nervous/anxious.          Past Medical History:  Diagnosis Date   Acute neck pain 05/05/2021   Allergy     Anxiety    Asthma    worse when younger   Cellulitis of face 05/05/2021   COVID-19 virus infection 02/16/2023   Elevated blood pressure reading 08/13/2019   GERD (gastroesophageal reflux disease)    Migraine    1-2x/month   Persistent cough for 3 weeks or longer 10/09/2019   PONV (postoperative nausea and vomiting)    Seizure (HCC)    Vaginal itching 05/27/2020   Vertigo    no episodes for 7 yrs    Social History   Socioeconomic History   Marital status: Widowed    Spouse name: Dick   Number of children: 0   Years of education: 10th   Highest education level: 12th grade  Occupational History    Employer: [id number]  Tobacco Use   Smoking  status: Former    Current packs/day: 0.00    Types: Cigarettes    Quit date: 08/28/2013    Years since quitting: 10.1   Smokeless tobacco: Never   Tobacco comments:    Quit Janurary 1st  Vaping Use   Vaping status: Never Used  Substance and Sexual Activity   Alcohol use: No    Alcohol/week: 0.0 standard drinks of alcohol   Drug use: No   Sexual activity: Not Currently    Birth control/protection:  Surgical  Other Topics Concern   Not on file  Social History Narrative   Patient lives alone   No children.   Works at [employer].   Enjoys going to [employer], going to estate sells.   Right handed   Caffeine: 1 Dr. Nunzio all day long (5-6)   Social Drivers of Health   Financial Resource Strain: Patient Declined (09/30/2023)   Overall Financial Resource Strain (CARDIA)    Difficulty of Paying Living Expenses: Patient declined  Food Insecurity: Patient Declined (09/30/2023)   Hunger Vital Sign    Worried About Running Out of Food in the Last Year: Patient declined    Ran Out of Food in the Last Year: Patient declined  Transportation Needs: No Transportation Needs (09/30/2023)   PRAPARE - Administrator, Civil Service (Medical): No    Lack of Transportation (Non-Medical): No  Physical Activity: Unknown (09/30/2023)   Exercise Vital Sign    Days of Exercise per Week: 0 days    Minutes of Exercise per Session: Not on file  Stress: Stress Concern Present (09/30/2023)   [employer] of Occupational Health - Occupational Stress Questionnaire    Feeling of Stress : Rather much  Social Connections: Socially Isolated (09/30/2023)   Social Connection and Isolation Panel [NHANES]    Frequency of Communication with Friends and Family: More than three times a week    Frequency of Social Gatherings with Friends and Family: Patient declined    Attends Religious Services: Never    Database Administrator or Organizations: No    Attends Engineer, Structural: Not on file    Marital Status: Widowed  Intimate Partner Violence: Not on file    Past Surgical History:  Procedure Laterality Date   ABDOMINAL HYSTERECTOMY  2012   COLONOSCOPY     COLONOSCOPY WITH PROPOFOL  N/A 04/24/2019   Procedure: COLONOSCOPY WITH PROPOFOL ;  Surgeon: Janalyn Keene NOVAK, MD;  Location: [facility] END[facility]COPY;  Service: Endoscopy;  Laterality: N/A;   ESOPHAGOGASTRODUODENOSCOPY (EGD) WITH PROPOFOL  N/A  04/24/2019   Procedure: ESOPHAGOGASTRODUODENOSCOPY (EGD) WITH PROPOFOL ;  Surgeon: Janalyn Keene NOVAK, MD;  Location: [facility] ENDOSCOPY;  Service: Endoscopy;  Laterality: N/A;   FOOT SURGERY Right 01/24/2022   HAND SURGERY Right 05/07/2020   trigger release   UPPER GASTROINTESTINAL ENDOSCOPY      Family History  Problem Relation Age of Onset   Hypertension Mother    Colon cancer Neg Hx    Esophageal cancer Neg Hx    Stomach cancer Neg Hx    Rectal cancer Neg Hx     Allergies  Allergen Reactions   Asa Buff (Mag [Buffered Aspirin ] Other (See Comments)    Stomach pain   Aspirin  Other (See Comments)   Azithromycin Other (See Comments)    GI problems   Ibuprofen Other (See Comments)    GI upset   Penicillins Other (See Comments)    Unknown childhood allergic reaction Has patient had a PCN reaction causing immediate rash, facial/tongue/throat  swelling, SOB or lightheadedness with hypotension: Unknown Has patient had a PCN reaction causing severe rash involving mucus membranes or skin necrosis: Unknown Has patient had a PCN reaction that required hospitalization: Unknown Has patient had a PCN reaction occurring within the last 10 years: No If all of the above answers are NO, then may proceed with Cephalosporin use.    Current Outpatient Medications on File Prior to Visit  Medication Sig Dispense Refill   clobetasol  cream (TEMOVATE ) 0.05 % APPLY CREAM TWICE DAILY 30 g 0   escitalopram  (LEXAPRO ) 20 MG tablet TAKE 1 TABLET BY MOUTH ONCE DAILY FOR ANXIETY 90 tablet 0   ondansetron  (ZOFRAN -ODT) 4 MG disintegrating tablet Take 1 tablet (4 mg total) by mouth every 8 (eight) hours as needed for nausea or vomiting. 15 tablet 0   PHENobarbital  (LUMINAL) 97.2 MG tablet Take 2 tablets (194.4 mg total) by mouth daily. 180 tablet 1   rizatriptan  (MAXALT -MLT) 10 MG disintegrating tablet DISSOLVE 1 TABLET IN MOUTH ONCE DAILY AS NEEDED FOR MIGRAINE. MAY REPEAT IN 2 HOURS IF NEEDED 9 tablet 0    triamcinolone  cream (KENALOG ) 0.1 % Apply 1 Application topically 2 (two) times daily. (Patient not taking: Reported on 10/02/2023) 30 g 0   No current facility-administered medications on file prior to visit.    BP 124/80   Pulse 70   Temp 97.7 F (36.5 C) (Temporal)   Ht 5' 5 (1.651 m)   Wt 181 lb (82.1 kg)   SpO2 97%   BMI 30.12 kg/m  Objective:   Physical Exam HENT:     Right Ear: Tympanic membrane and ear canal normal.     Left Ear: Tympanic membrane and ear canal normal.  Eyes:     Pupils: Pupils are equal, round, and reactive to light.  Cardiovascular:     Rate and Rhythm: Normal rate and regular rhythm.  Pulmonary:     Effort: Pulmonary effort is normal.     Breath sounds: Normal breath sounds. No wheezing.     Comments: Dry cough noted during exam Abdominal:     General: Bowel sounds are normal.     Palpations: Abdomen is soft.     Tenderness: There is no abdominal tenderness.  Musculoskeletal:        General: Normal range of motion.     Cervical back: Neck supple.  Skin:    General: Skin is warm and dry.  Neurological:     Mental Status: She is alert and oriented to person, place, and time.     Cranial Nerves: No cranial nerve deficit.     Deep Tendon Reflexes:     Reflex Scores:      Patellar reflexes are 2+ on the right side and 2+ on the left side. Psychiatric:        Mood and Affect: Mood normal.           Assessment & Plan:  Encounter for annual general medical examination with abnormal findings in adult Assessment & Plan: Immunizations UTD. Declines influenza vaccine.  Mammogram UTD. Colonoscopy due, referral placed to GI  Discussed the importance of a healthy diet and regular exercise in order for weight loss, and to reduce the risk of further co-morbidity.  Exam stable. Labs pending.  Follow up in 1 year for repeat physical.    Slow transit constipation Assessment & Plan: Chronic and continued.  Recommended GI for follow up.   Referral placed to GI.  Orders: -     Ambulatory  referral to Gastroenterology  Screening for colon cancer -     Ambulatory referral to Gastroenterology  Migraine without aura and without status migrainosus, not intractable Assessment & Plan: Improved!  Continue Maxalt  10 mg as needed. Reviewed neurology notes from July 2024.   Mild intermittent asthma without complication Assessment & Plan: Stable per patient.  Numerous inhalers have been cost prohibitive. Rx for Proair  inhaler sent to pharmacy.   She will update if this is cost prohibitive.  Orders: -     Albuterol  Sulfate HFA; Inhale 2 puffs into the lungs every 6 (six) hours as needed for wheezing or shortness of breath.  Dispense: 6.7 g; Refill: 0  Hyperlipidemia, unspecified hyperlipidemia type Assessment & Plan: Repeat lipid panel pending today.  Orders: -     Lipid panel -     Comprehensive metabolic panel  Persistent cough for 3 weeks or longer Assessment & Plan: Differentials include silent reflux, asthma.  Exam today without wheezing, no clear exacerbation noted. Start omeprazole  20 to 40 mg daily at night.  She will update in 1 week.  Consider chest x-ray if no improvement. Prescription for albuterol  inhaler sent to pharmacy to use as needed.  Orders: -     Omeprazole ; Take 1-2 capsules (20-40 mg total) by mouth daily. For cough  Dispense: 60 capsule; Refill: 0  Mixed stress and urge urinary incontinence Assessment & Plan: Urinalysis today negative.  Discussed treatment options including Kegel exercises, pelvic floor physical therapy, medication. She will start with Kegel exercises, handout provided today.  She will update.  Orders: -     POCT Urinalysis Dipstick (Automated)  Vitamin D  deficiency Assessment & Plan: Not currently on supplementation. Repeat vitamin D  level pending.  Orders: -     VITAMIN D  25 Hydroxy (Vit-D Deficiency, Fractures)  B12 deficiency -     Vitamin  B12  Vitamin B 12 deficiency Assessment & Plan: Currently on supplementation. Vitamin B12 level pending.   Juvenile myoclonic epilepsy, not intractable, without status epilepticus (HCC) Assessment & Plan: Controlled.  Following with urology, office notes reviewed from July 2024. Continue phenobarbital  194.4 mg twice daily   GAD (generalized anxiety disorder) Assessment & Plan: Controlled.  Continue Lexapro  20 mg daily.         Kazden Largo K Hamdi Kley, NP

## 2023-10-02 NOTE — Assessment & Plan Note (Signed)
Immunizations UTD. Declines influenza vaccine. Mammogram UTD. Colonoscopy due, referral placed to GI  Discussed the importance of a healthy diet and regular exercise in order for weight loss, and to reduce the risk of further co-morbidity.  Exam stable. Labs pending.  Follow up in 1 year for repeat physical.

## 2023-10-02 NOTE — Assessment & Plan Note (Signed)
Not currently on supplementation.  Repeat vitamin D level pending.

## 2023-10-02 NOTE — Assessment & Plan Note (Signed)
Controlled. ° °Continue Lexapro 20 mg daily. °

## 2023-10-02 NOTE — Patient Instructions (Addendum)
 Start omeprazole  20 mg capsules for cough.  Take 1 to 2 capsules by mouth every evening.  Stop by the lab prior to leaving today. I will notify you of your results once received.   Try Kegel exercises to help strengthen pelvic floor muscles.  You will either be contacted via phone regarding your referral to GI, or you may receive a letter on your MyChart portal from our referral team with instructions for scheduling an appointment. Please let us  know if you have not been contacted by anyone within two weeks.  Let me know if the albuterol  inhaler is too expensive.  It was a pleasure to see you today!

## 2023-10-02 NOTE — Telephone Encounter (Signed)
 Pharmacy Patient Advocate Encounter   Received notification from CoverMyMeds that prior authorization for Qulipta  60MG  tablets is required/requested.   Insurance verification completed.   The patient is insured through [facility] .   Per test claim: PA required; PA submitted to above mentioned insurance via CoverMyMeds Key/confirmation #/EOC [id number] Status is pending

## 2023-10-02 NOTE — Assessment & Plan Note (Signed)
 Differentials include silent reflux, asthma.  Exam today without wheezing, no clear exacerbation noted. Start omeprazole  20 to 40 mg daily at night.  She will update in 1 week.  Consider chest x-ray if no improvement. Prescription for albuterol  inhaler sent to pharmacy to use as needed.

## 2023-10-03 DIAGNOSIS — E559 Vitamin D deficiency, unspecified: Secondary | ICD-10-CM

## 2023-10-03 LAB — COMPREHENSIVE METABOLIC PANEL
ALT: 10 U/L (ref 0–35)
AST: 12 U/L (ref 0–37)
Albumin: 4.3 g/dL (ref 3.5–5.2)
Alkaline Phosphatase: 77 U/L (ref 39–117)
BUN: 11 mg/dL (ref 6–23)
CO2: 27 meq/L (ref 19–32)
Calcium: 9 mg/dL (ref 8.4–10.5)
Chloride: 102 meq/L (ref 96–112)
Creatinine, Ser: 0.68 mg/dL (ref 0.40–1.20)
GFR: 98.84 mL/min (ref >60.00)
Glucose, Bld: 87 mg/dL (ref 70–99)
Potassium: 3.8 meq/L (ref 3.5–5.1)
Sodium: 139 meq/L (ref 135–145)
Total Bilirubin: 0.3 mg/dL (ref 0.2–1.2)
Total Protein: 7 g/dL (ref 6.0–8.3)

## 2023-10-03 LAB — LIPID PANEL
Cholesterol: 191 mg/dL (ref 0–200)
HDL: 48.1 mg/dL (ref >39.00)
LDL Cholesterol: 99 mg/dL (ref 0–99)
NonHDL: 142.48
Total CHOL/HDL Ratio: 4
Triglycerides: 219 mg/dL — ABNORMAL HIGH (ref 0.0–149.0)
VLDL: 43.8 mg/dL — ABNORMAL HIGH (ref 0.0–40.0)

## 2023-10-03 LAB — VITAMIN B12: Vitamin B-12: 301 pg/mL (ref 211–911)

## 2023-10-03 LAB — VITAMIN D 25 HYDROXY (VIT D DEFICIENCY, FRACTURES): VITD: 10.08 ng/mL — ABNORMAL LOW (ref 30.00–100.00)

## 2023-10-04 MED ORDER — VITAMIN D (ERGOCALCIFEROL) 1.25 MG (50000 UNIT) PO CAPS: ORAL_CAPSULE | ORAL | 0 refills | Status: DC

## 2023-10-10 ENCOUNTER — Other Ambulatory Visit: Payer: Self-pay

## 2023-10-10 DIAGNOSIS — G40B09 Juvenile myoclonic epilepsy, not intractable, without status epilepticus: Secondary | ICD-10-CM

## 2023-10-10 MED ORDER — PHENOBARBITAL 97.2 MG PO TABS: 194.4000 mg | ORAL_TABLET | Freq: Every day | ORAL | 1 refills | Status: DC

## 2023-10-10 NOTE — Telephone Encounter (Signed)
Pt last seen 03/12/2023 No Upcoming Appointment  Phenobarbital 97.2mg  last filled 07/15/2023 Escript 10/10/2023

## 2023-10-11 NOTE — Telephone Encounter (Signed)
Pharmacy Patient Advocate Encounter  Received notification from [facility] that Prior Authorization for Qulipta 60MG  tablets  has been APPROVED from 10/02/2023 to 10/01/2024   PA #/Case ID/Reference #: [id number]

## 2023-10-25 DIAGNOSIS — L705 Acne excoriee des jeunes filles: Secondary | ICD-10-CM

## 2023-10-30 ENCOUNTER — Encounter (INDEPENDENT_AMBULATORY_CARE_PROVIDER_SITE_OTHER): Payer: Self-pay

## 2023-11-10 ENCOUNTER — Other Ambulatory Visit: Payer: Self-pay | Admitting: Primary Care

## 2023-11-10 DIAGNOSIS — J4520 Mild intermittent asthma, uncomplicated: Secondary | ICD-10-CM

## 2023-11-10 MED ORDER — ALBUTEROL SULFATE HFA 108 (90 BASE) MCG/ACT IN AERS: 2.0000 | INHALATION_SPRAY | Freq: Four times a day (QID) | RESPIRATORY_TRACT | 0 refills | Status: DC | PRN

## 2023-11-14 ENCOUNTER — Encounter: Payer: Self-pay | Admitting: Physician Assistant

## 2023-11-15 ENCOUNTER — Ambulatory Visit: Admitting: Primary Care

## 2023-11-15 ENCOUNTER — Encounter: Payer: Self-pay | Admitting: *Deleted

## 2023-11-15 ENCOUNTER — Encounter: Payer: Self-pay | Admitting: Primary Care

## 2023-11-15 VITALS — BP 118/80 | HR 78 | Temp 97.2°F | Ht 65.0 in | Wt 182.0 lb

## 2023-11-15 DIAGNOSIS — G8929 Other chronic pain: Secondary | ICD-10-CM

## 2023-11-15 DIAGNOSIS — M545 Low back pain, unspecified: Secondary | ICD-10-CM

## 2023-11-15 DIAGNOSIS — G43009 Migraine without aura, not intractable, without status migrainosus: Secondary | ICD-10-CM

## 2023-11-15 MED ORDER — CYCLOBENZAPRINE HCL 5 MG PO TABS: 5.0000 mg | ORAL_TABLET | Freq: Three times a day (TID) | ORAL | 0 refills | Status: DC | PRN

## 2023-11-15 NOTE — Patient Instructions (Addendum)
 Stop by the lab prior to leaving today. I will notify you of your results once received.   You may take cyclobenzaprine 5 mg tablets up to 3 times daily as needed for muscle spasms.  Be careful as this may cause drowsiness.  Start at bedtime.  You will receive a phone call regarding the MRI  It was a pleasure to see you today!

## 2023-11-15 NOTE — Assessment & Plan Note (Signed)
 Acute on chronic flare. Reviewed xray from September 2024.  Start cyclobenzaprine 5 mg PRN. Drowsiness precautions provided.  Will try to obtain MR Lumbar spine given ongoing symptoms. She agrees. Orders placed.

## 2023-11-15 NOTE — Assessment & Plan Note (Signed)
 Unsure if migraines are triggered by a food allergy. She doesn't have systemic symptoms to suggest allergy. Will add food allergy panel and alpha gal testing today.

## 2023-11-15 NOTE — Progress Notes (Signed)
 Subjective:    Patient ID: [name], female    DOB: 08/15/1971, 53 y.o.   MRN: [id number]  Back Pain Pertinent negatives include no numbness.    [name] is a very pleasant 53 y.o. female with a history of migraines, asthma, seizure disorder, GAD, chronic foot pain, bilateral hand pain, chronic low back pain who presents today to discuss back pain and potential food allergies.  1) Chronic Back Pain: Acute on chronic flare. Her back pain occurs daily. Over the years her pain frequency and intensity has increased. Her last severe flare was in September 2024, xray of the lumbar spine did not show abnormality so MRI lumbar spine was ordered. She did not complete the MRI as her insurance would not cover.   Her pain is always located to the upper lumbar spine with radiation down the midline spine to the lower lumbar spine. Her bad flare began about 1 week ago. Her pain is constant, worse with sitting, standing, laying down at night, flexion, twisting, bending forward.   She denies radiation of pain to her lower extremities. Last night she developed a burning sensation to her midline lower back when laying on her right and left side.  She's been taking Tylenol without improvement. She denies changes in bowel/bladder control, injury, numbness/tingling.   2) Food Allergy Testing: She questions if she has a food allergy. One week ago she was eating a hot dog with mustard and ketchup, chili and slaw, BBQ chips. She felt a sudden onset of headache, almost to the migraine level. She has a chronic history of migraines and questions if she is allergic to a food causing headaches.   He has never undergone allergy testing. Denies recent tick bites, wheezing, throat tightness, shortness of breath, hives.   Review of Systems  Respiratory:  Negative for shortness of breath and wheezing.   Musculoskeletal:  Positive for back pain.  Skin:  Negative for rash.  Neurological:  Negative  for numbness.         Past Medical History:  Diagnosis Date   Acute neck pain 05/05/2021   Allergy    Anxiety    Asthma    worse when younger   Cellulitis of face 05/05/2021   COVID-19 virus infection 02/16/2023   Elevated blood pressure reading 08/13/2019   GERD (gastroesophageal reflux disease)    Migraine    1-2x/month   Persistent cough for 3 weeks or longer 10/09/2019   PONV (postoperative nausea and vomiting)    Seizure (HCC)    Vaginal itching 05/27/2020   Vertigo    no episodes for 7 yrs    Social History   Socioeconomic History   Marital status: Widowed    Spouse name: Raiford Noble   Number of children: 0   Years of education: 10th   Highest education level: 12th grade  Occupational History    Employer: [id number]  Tobacco Use   Smoking status: Former    Current packs/day: 0.00    Types: Cigarettes    Quit date: 08/28/2013    Years since quitting: 10.2   Smokeless tobacco: Never   Tobacco comments:    Quit Janurary 1st  Vaping Use   Vaping status: Never Used  Substance and Sexual Activity   Alcohol use: No    Alcohol/week: 0.0 standard drinks of alcohol   Drug use: No   Sexual activity: Not Currently    Birth control/protection: Surgical  Other Topics Concern   Not on file  Social History Narrative   Patient lives alone   No children.   Works at [employer].   Enjoys going to [employer], going to estate sells.   Right handed   Caffeine: 1 Dr. Reino Kent all day long (5-6)   Social Drivers of Health   Financial Resource Strain: Patient Declined (09/30/2023)   Overall Financial Resource Strain (CARDIA)    Difficulty of Paying Living Expenses: Patient declined  Food Insecurity: Patient Declined (09/30/2023)   Hunger Vital Sign    Worried About Running Out of Food in the Last Year: Patient declined    Ran Out of Food in the Last Year: Patient declined  Transportation Needs: No Transportation Needs (09/30/2023)   PRAPARE - Administrator, Civil Service  (Medical): No    Lack of Transportation (Non-Medical): No  Physical Activity: Unknown (09/30/2023)   Exercise Vital Sign    Days of Exercise per Week: 0 days    Minutes of Exercise per Session: Not on file  Stress: Stress Concern Present (09/30/2023)   [employer] of Occupational Health - Occupational Stress Questionnaire    Feeling of Stress : Rather much  Social Connections: Socially Isolated (09/30/2023)   Social Connection and Isolation Panel [NHANES]    Frequency of Communication with Friends and Family: More than three times a week    Frequency of Social Gatherings with Friends and Family: Patient declined    Attends Religious Services: Never    Database administrator or Organizations: No    Attends Engineer, structural: Not on file    Marital Status: Widowed  Intimate Partner Violence: Not on file    Past Surgical History:  Procedure Laterality Date   ABDOMINAL HYSTERECTOMY  2012   COLONOSCOPY     COLONOSCOPY WITH PROPOFOL N/A 04/24/2019   Procedure: COLONOSCOPY WITH PROPOFOL;  Surgeon: Pasty Spillers, MD;  Location: [facility];  Service: Endoscopy;  Laterality: N/A;   ESOPHAGOGASTRODUODENOSCOPY (EGD) WITH PROPOFOL N/A 04/24/2019   Procedure: ESOPHAGOGASTRODUODENOSCOPY (EGD) WITH PROPOFOL;  Surgeon: Pasty Spillers, MD;  Location: [facility];  Service: Endoscopy;  Laterality: N/A;   FOOT SURGERY Right 01/24/2022   HAND SURGERY Right 05/07/2020   trigger release   UPPER GASTROINTESTINAL ENDOSCOPY      Family History  Problem Relation Age of Onset   Hypertension Mother    Colon cancer Neg Hx    Esophageal cancer Neg Hx    Stomach cancer Neg Hx    Rectal cancer Neg Hx     Allergies  Allergen Reactions   Asa Buff (Mag [Buffered Aspirin] Other (See Comments)    Stomach pain   Aspirin Other (See Comments)   Azithromycin Other (See Comments)    GI problems   Ibuprofen Other (See Comments)    GI upset   Penicillins Other (See Comments)     Unknown childhood allergic reaction Has patient had a PCN reaction causing immediate rash, facial/tongue/throat swelling, SOB or lightheadedness with hypotension: Unknown Has patient had a PCN reaction causing severe rash involving mucus membranes or skin necrosis: Unknown Has patient had a PCN reaction that required hospitalization: Unknown Has patient had a PCN reaction occurring within the last 10 years: No If all of the above answers are "NO", then may proceed with Cephalosporin use.    Current Outpatient Medications on File Prior to Visit  Medication Sig Dispense Refill   albuterol (VENTOLIN HFA) 108 (90 Base) MCG/ACT inhaler Inhale 2 puffs into the lungs every 6 (six) hours  as needed for wheezing or shortness of breath. 6.7 g 0   clobetasol cream (TEMOVATE) 0.05 % APPLY CREAM TWICE DAILY 30 g 0   escitalopram (LEXAPRO) 20 MG tablet TAKE 1 TABLET BY MOUTH ONCE DAILY FOR ANXIETY 90 tablet 0   omeprazole (PRILOSEC) 20 MG capsule Take 1-2 capsules (20-40 mg total) by mouth daily. For cough 60 capsule 0   ondansetron (ZOFRAN-ODT) 4 MG disintegrating tablet Take 1 tablet (4 mg total) by mouth every 8 (eight) hours as needed for nausea or vomiting. 15 tablet 0   PHENobarbital (LUMINAL) 97.2 MG tablet Take 2 tablets (194.4 mg total) by mouth daily. 180 tablet 1   rizatriptan (MAXALT-MLT) 10 MG disintegrating tablet DISSOLVE 1 TABLET IN MOUTH ONCE DAILY AS NEEDED FOR MIGRAINE. MAY REPEAT IN 2 HOURS IF NEEDED 9 tablet 0   Vitamin D, Ergocalciferol, (DRISDOL) 1.25 MG (50000 UNIT) CAPS capsule Take 1 capsule by mouth once weekly for 12 weeks. 12 capsule 0   triamcinolone cream (KENALOG) 0.1 % Apply 1 Application topically 2 (two) times daily. (Patient not taking: Reported on 11/15/2023) 30 g 0   No current facility-administered medications on file prior to visit.    BP 118/80   Pulse 78   Temp (!) 97.2 F (36.2 C) (Temporal)   Ht 5\' 5"  (1.651 m)   Wt 182 lb (82.6 kg)   SpO2 97%   BMI 30.29  kg/m  Objective:   Physical Exam Cardiovascular:     Rate and Rhythm: Normal rate.  Pulmonary:     Effort: Pulmonary effort is normal.  Musculoskeletal:     Cervical back: Neck supple.     Lumbar back: Tenderness and bony tenderness present. Decreased range of motion. Negative right straight leg raise test and negative left straight leg raise test.       Back:  Skin:    General: Skin is warm and dry.  Neurological:     Mental Status: She is alert and oriented to person, place, and time.  Psychiatric:        Mood and Affect: Mood normal.           Assessment & Plan:  Chronic midline low back pain without sciatica Assessment & Plan: Acute on chronic flare. Reviewed xray from September 2024.  Start cyclobenzaprine 5 mg PRN. Drowsiness precautions provided.  Will try to obtain MR Lumbar spine given ongoing symptoms. She agrees. Orders placed.  Orders: -     MR LUMBAR SPINE WO CONTRAST; Future -     Cyclobenzaprine HCl; Take 1 tablet (5 mg total) by mouth 3 (three) times daily as needed for muscle spasms.  Dispense: 30 tablet; Refill: 0  Migraine without aura and without status migrainosus, not intractable Assessment & Plan: Unsure if migraines are triggered by a food allergy. She doesn't have systemic symptoms to suggest allergy. Will add food allergy panel and alpha gal testing today.   Orders: -     Food Allergy Profile -     Alpha-Gal Panel        Doreene Nest, NP

## 2023-11-19 LAB — ALPHA-GAL PANEL
Allergen, Mutton, f88: <0.1 kU/L
Allergen, Pork, f26: <0.1 kU/L
Beef: <0.1 kU/L
CLASS: 0
CLASS: 0
Class: 0
GALACTOSE-ALPHA-1,3-GALACTOSE IGE*: <0.1 kU/L (ref <0.10)

## 2023-11-19 LAB — FOOD ALLERGY PROFILE
Allergen, Salmon, f41: <0.1 kU/L
Almonds: <0.1 kU/L
Brazil Nut: <0.1 kU/L
CLASS: 0
CLASS: 0
CLASS: 0
CLASS: 0
CLASS: 0
CLASS: 0
CLASS: 0
CLASS: 0
CLASS: 0
CLASS: 0
CLASS: 0
CLASS: 1
Cashew IgE: <0.1 kU/L
Class: 0
Class: 0
Class: 0
Class: 0
Class: 0
Egg White IgE: <0.1 kU/L
Fish Cod: <0.1 kU/L
Hazelnut: 0.47 kU/L — ABNORMAL HIGH
Macadamia Nut: <0.1 kU/L
Milk IgE: <0.1 kU/L
Peanut IgE: <0.1 kU/L
Scallop IgE: <0.1 kU/L
Sesame Seed f10: <0.1 kU/L
Shrimp IgE: <0.1 kU/L
Soybean IgE: <0.1 kU/L
Tuna IgE: <0.1 kU/L
Walnut: <0.1 kU/L
Wheat IgE: <0.1 kU/L

## 2023-11-19 LAB — MISC HAZELNUT COMP PNL
Cor a1(f428): 0.8 kU/L — ABNORMAL HIGH (ref <0.10)
Cor a14(f439): <0.1 kU/L (ref <0.10)
Cor a8(f425): <0.1 kU/L (ref <0.10)
Cor a9(f440): <0.1 kU/L (ref <0.10)

## 2023-11-19 LAB — INTERPRETATION:

## 2023-11-23 ENCOUNTER — Ambulatory Visit: Admitting: Primary Care

## 2023-11-23 ENCOUNTER — Other Ambulatory Visit: Payer: Self-pay | Admitting: Primary Care

## 2023-11-23 DIAGNOSIS — L309 Dermatitis, unspecified: Secondary | ICD-10-CM

## 2023-11-23 MED ORDER — CLOBETASOL PROPIONATE 0.05 % EX CREA: TOPICAL_CREAM | Freq: Two times a day (BID) | CUTANEOUS | 0 refills | Status: DC | PRN

## 2023-11-25 DIAGNOSIS — M545 Low back pain, unspecified: Secondary | ICD-10-CM

## 2023-11-26 MED ORDER — PREDNISONE 20 MG PO TABS: ORAL_TABLET | ORAL | 0 refills | Status: DC

## 2023-12-02 ENCOUNTER — Other Ambulatory Visit: Payer: Self-pay | Admitting: Primary Care

## 2023-12-02 DIAGNOSIS — J4520 Mild intermittent asthma, uncomplicated: Secondary | ICD-10-CM

## 2023-12-03 ENCOUNTER — Other Ambulatory Visit: Payer: Self-pay | Admitting: Primary Care

## 2023-12-03 DIAGNOSIS — J4520 Mild intermittent asthma, uncomplicated: Secondary | ICD-10-CM

## 2023-12-03 MED ORDER — ALBUTEROL SULFATE HFA 108 (90 BASE) MCG/ACT IN AERS: 2.0000 | INHALATION_SPRAY | Freq: Four times a day (QID) | RESPIRATORY_TRACT | 0 refills | Status: AC | PRN

## 2023-12-04 ENCOUNTER — Encounter (INDEPENDENT_AMBULATORY_CARE_PROVIDER_SITE_OTHER): Payer: Self-pay

## 2023-12-04 ENCOUNTER — Ambulatory Visit (INDEPENDENT_AMBULATORY_CARE_PROVIDER_SITE_OTHER): Admitting: Family Medicine

## 2023-12-04 ENCOUNTER — Encounter (INDEPENDENT_AMBULATORY_CARE_PROVIDER_SITE_OTHER): Payer: Self-pay | Admitting: Family Medicine

## 2023-12-04 DIAGNOSIS — E669 Obesity, unspecified: Secondary | ICD-10-CM

## 2023-12-05 DIAGNOSIS — G8929 Other chronic pain: Secondary | ICD-10-CM

## 2023-12-06 MED ORDER — GABAPENTIN 100 MG PO CAPS: ORAL_CAPSULE | ORAL | 0 refills | Status: DC

## 2023-12-09 ENCOUNTER — Ambulatory Visit
Admission: RE | Admit: 2023-12-09 | Discharge: 2023-12-09 | Disposition: A | Discharge status: Home / Self Care | Source: Ambulatory Visit | Attending: Primary Care | Admitting: Primary Care

## 2023-12-09 DIAGNOSIS — G8929 Other chronic pain: Secondary | ICD-10-CM

## 2023-12-09 DIAGNOSIS — M545 Low back pain, unspecified: Secondary | ICD-10-CM

## 2023-12-11 ENCOUNTER — Ambulatory Visit: Admitting: Physical Medicine and Rehabilitation

## 2023-12-11 DIAGNOSIS — M545 Low back pain, unspecified: Secondary | ICD-10-CM

## 2023-12-11 DIAGNOSIS — M7918 Myalgia, other site: Secondary | ICD-10-CM

## 2023-12-11 DIAGNOSIS — M546 Pain in thoracic spine: Secondary | ICD-10-CM

## 2023-12-11 DIAGNOSIS — M25552 Pain in left hip: Secondary | ICD-10-CM

## 2023-12-11 DIAGNOSIS — M25551 Pain in right hip: Secondary | ICD-10-CM | POA: Diagnosis not present

## 2023-12-11 DIAGNOSIS — G8929 Other chronic pain: Secondary | ICD-10-CM

## 2023-12-11 NOTE — Progress Notes (Unsigned)
 Pain Scale   Average Pain 6 Patient advising her pain increases when standing for log periods of time radiating to bilateral hips         +Driver, -BT, -Dye Allergies.
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
## 2023-12-12 ENCOUNTER — Encounter: Payer: Self-pay | Admitting: Physical Medicine and Rehabilitation

## 2023-12-13 ENCOUNTER — Ambulatory Visit: Admitting: Primary Care

## 2023-12-17 NOTE — Progress Notes (Signed)
 Not seen- does not meet criteria for our program

## 2023-12-18 ENCOUNTER — Other Ambulatory Visit: Payer: Self-pay | Admitting: Primary Care

## 2023-12-18 DIAGNOSIS — F411 Generalized anxiety disorder: Secondary | ICD-10-CM

## 2023-12-18 MED ORDER — ESCITALOPRAM OXALATE 20 MG PO TABS: ORAL_TABLET | ORAL | 2 refills | Status: DC

## 2023-12-22 ENCOUNTER — Other Ambulatory Visit: Payer: Self-pay | Admitting: Primary Care

## 2023-12-22 DIAGNOSIS — E559 Vitamin D deficiency, unspecified: Secondary | ICD-10-CM

## 2024-01-03 ENCOUNTER — Other Ambulatory Visit: Payer: Self-pay | Admitting: *Deleted

## 2024-01-03 ENCOUNTER — Encounter: Payer: Self-pay | Admitting: Family Medicine

## 2024-01-03 MED ORDER — PHENOBARBITAL 97.2 MG PO TABS: 194.4000 mg | ORAL_TABLET | Freq: Every day | ORAL | 0 refills | Status: DC

## 2024-01-04 IMAGING — DX DG CHEST 1V PORT
1 series · 1 of 1 positions shown · non-contrast
Comparison: 10/09/2019

CLINICAL DATA: Chest pain and tightness.

EXAM:
PORTABLE CHEST 1 VIEW

[chest ap]
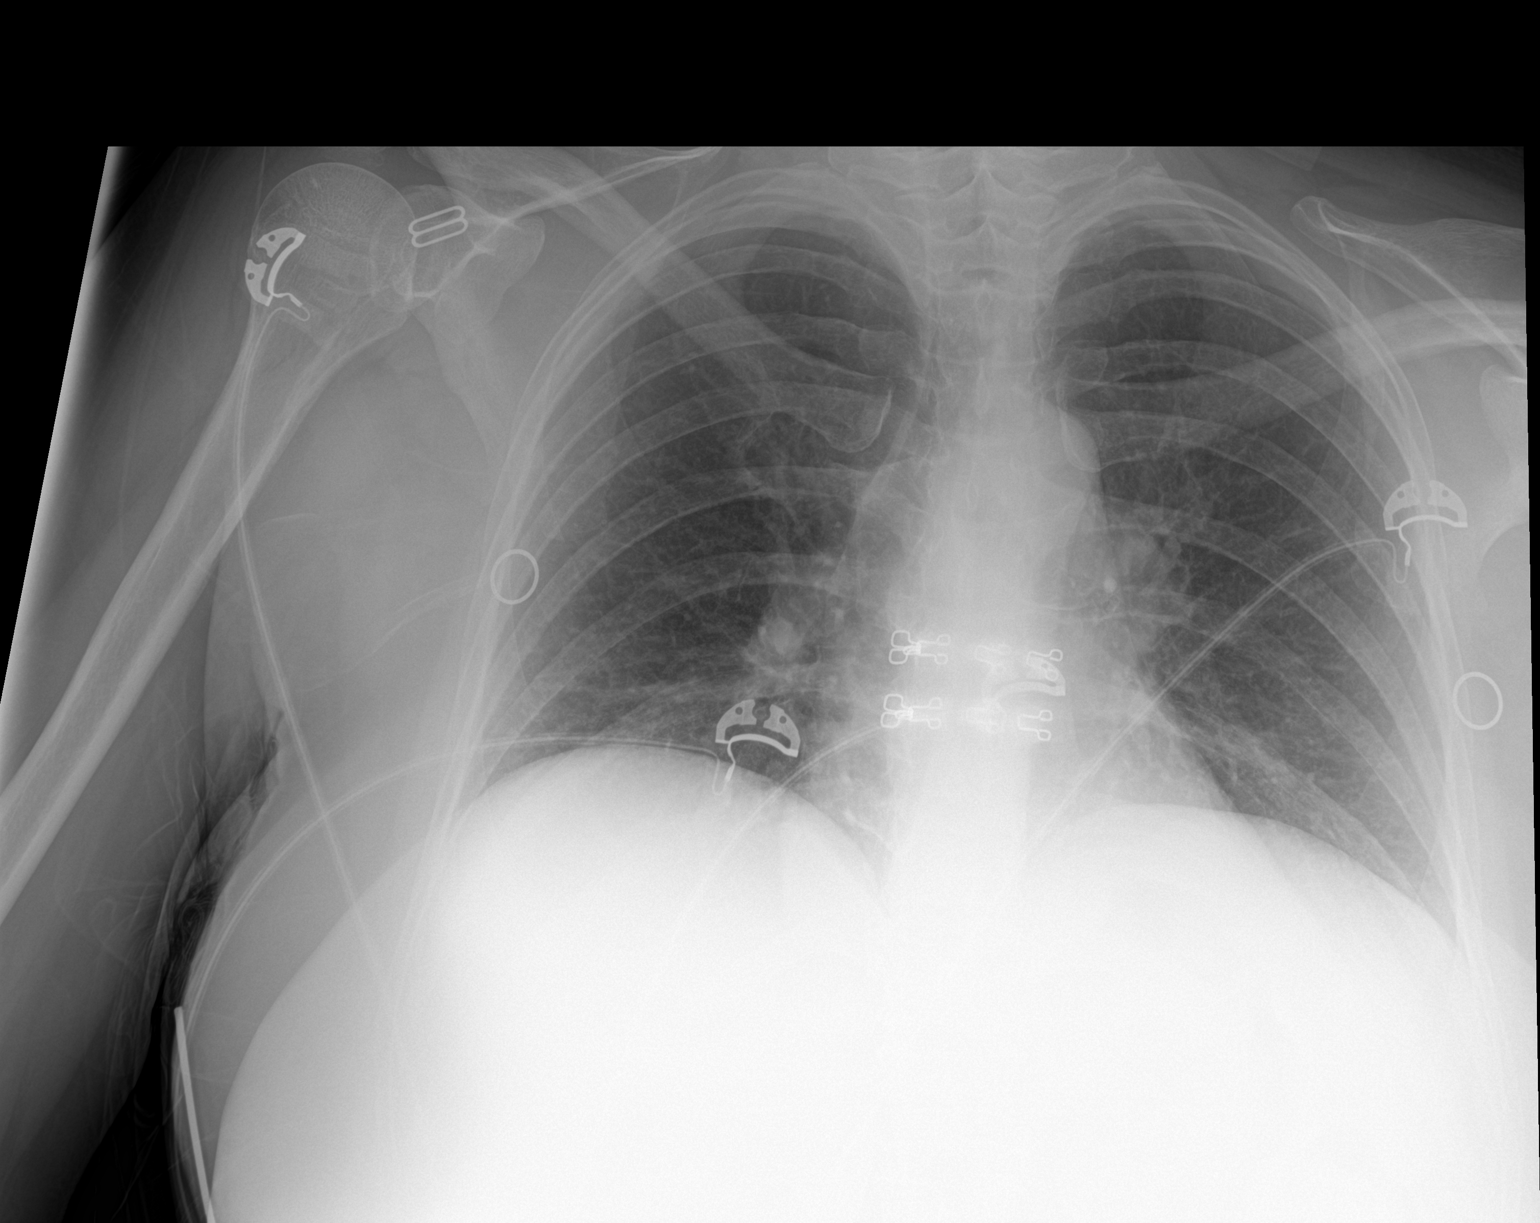

[1 of 1 positions shown; findings below may reference images not displayed]

FINDINGS: 1225 hours. Low lung volumes. The lungs are clear without focal
pneumonia, edema, pneumothorax or pleural effusion. Trace
atelectasis noted in the bases. The cardiopericardial silhouette is
within normal limits for size. The visualized bony structures of the
thorax show no acute abnormality. Telemetry leads overlie the chest.
IMPRESSION: Low volume film with basilar atelectasis.

## 2024-01-07 ENCOUNTER — Ambulatory Visit: Admitting: Physician Assistant

## 2024-01-29 ENCOUNTER — Other Ambulatory Visit: Payer: Self-pay | Admitting: Orthopedic Surgery

## 2024-01-30 LAB — SURGICAL PATHOLOGY

## 2024-03-13 ENCOUNTER — Other Ambulatory Visit: Payer: Self-pay | Admitting: Primary Care

## 2024-03-13 DIAGNOSIS — E559 Vitamin D deficiency, unspecified: Secondary | ICD-10-CM

## 2024-03-14 NOTE — Telephone Encounter (Signed)
 Unable to reach patient. Left voicemail to return call to our office.

## 2024-03-14 NOTE — Telephone Encounter (Signed)
 Please call patient:  Needs repeat vitamin D  lab. Lab only appt is fine.

## 2024-03-15 ENCOUNTER — Other Ambulatory Visit: Payer: Self-pay | Admitting: Primary Care

## 2024-03-15 DIAGNOSIS — L309 Dermatitis, unspecified: Secondary | ICD-10-CM

## 2024-03-17 NOTE — Telephone Encounter (Signed)
 Called patient and scheduled vitamin d  lab appt.

## 2024-03-21 ENCOUNTER — Other Ambulatory Visit

## 2024-03-21 ENCOUNTER — Ambulatory Visit: Payer: Self-pay | Admitting: Primary Care

## 2024-03-21 DIAGNOSIS — E559 Vitamin D deficiency, unspecified: Secondary | ICD-10-CM | POA: Diagnosis not present

## 2024-03-21 LAB — VITAMIN D 25 HYDROXY (VIT D DEFICIENCY, FRACTURES): VITD: 30.57 ng/mL (ref 30.00–100.00)

## 2024-04-02 ENCOUNTER — Other Ambulatory Visit: Payer: Self-pay | Admitting: *Deleted

## 2024-04-02 DIAGNOSIS — G40B09 Juvenile myoclonic epilepsy, not intractable, without status epilepticus: Secondary | ICD-10-CM

## 2024-04-02 NOTE — Telephone Encounter (Signed)
 Last seen on 03/12/23 No 6 month follow up scheduled    Dispensed Days Supply Quantity Provider Pharmacy  PHENOBARBITAL  97.2MG  TAB 01/07/2024 90 180 each Lomax, Amy, NP [facility]...    My chart message sent to patient to schedule updated visit.

## 2024-04-03 MED ORDER — PHENOBARBITAL 97.2 MG PO TABS: 194.4000 mg | ORAL_TABLET | Freq: Every day | ORAL | 0 refills | Status: DC

## 2024-04-03 NOTE — Telephone Encounter (Signed)
 Dr.Sethi you are work in provider this morning   Rx pending to be signed

## 2024-04-17 ENCOUNTER — Ambulatory Visit: Admitting: Primary Care

## 2024-05-16 ENCOUNTER — Ambulatory Visit: Admitting: Primary Care

## 2024-05-16 ENCOUNTER — Encounter: Payer: Self-pay | Admitting: Primary Care

## 2024-05-16 VITALS — BP 126/82 | HR 81 | Temp 97.2°F | Ht 65.0 in | Wt 181.0 lb

## 2024-05-16 DIAGNOSIS — K130 Diseases of lips: Secondary | ICD-10-CM | POA: Insufficient documentation

## 2024-05-16 MED ORDER — HYDROCORTISONE 1 % EX OINT: 1.0000 | TOPICAL_OINTMENT | Freq: Two times a day (BID) | CUTANEOUS | 0 refills | Status: DC

## 2024-05-16 NOTE — Assessment & Plan Note (Signed)
 Unclear cause.  We discussed to avoid licking lips.  Start hydrocortisone  1% ointment twice daily x 1 week.  We discussed caution with overuse. She will follow-up with dermatology November schedule.

## 2024-05-16 NOTE — Progress Notes (Signed)
 Subjective:    Patient ID: [name], female    DOB: 1971/01/12, 53 y.o.   MRN: [id number]  [name] is a very pleasant 53 y.o. female with a history of migraines, hot flashes, asthma, GERD, epilepsy, eczema who presents today to discuss dry lips.  Chronic since May 2025, improved until about 4 weeks ago. Over the last 4 weeks her lips have been chapped and dry with redness and burning sensation.  She has attempted to use Carmex, Chapstick, Vaseline without improvement.  She denies changes in food, medications, OTC supplements, new fragrances, lipstick. She also denies throat tightness or other allergy  symptoms. She has an appointment with dermatology in November.   Review of Systems  HENT:  Negative for trouble swallowing.   Respiratory:  Negative for shortness of breath and wheezing.   Skin:        Chapped lips         Past Medical History:  Diagnosis Date   Acute neck pain 05/05/2021   Allergy     Anxiety    Asthma    worse when younger   Cellulitis of face 05/05/2021   COVID-19 virus infection 02/16/2023   Elevated blood pressure reading 08/13/2019   GERD (gastroesophageal reflux disease)    Migraine    1-2x/month   Persistent cough for 3 weeks or longer 10/09/2019   PONV (postoperative nausea and vomiting)    Seizure (HCC)    Vaginal itching 05/27/2020   Vertigo    no episodes for 7 yrs    Social History   Socioeconomic History   Marital status: Widowed    Spouse name: Dick   Number of children: 0   Years of education: 10th   Highest education level: 12th grade  Occupational History    Employer: [id number]  Tobacco Use   Smoking status: Former    Current packs/day: 0.00    Types: Cigarettes    Quit date: 08/28/2013    Years since quitting: 10.7   Smokeless tobacco: Never   Tobacco comments:    Quit Janurary 1st  Vaping Use   Vaping status: Never Used  Substance and Sexual Activity   Alcohol use: No    Alcohol/week: 0.0  standard drinks of alcohol   Drug use: No   Sexual activity: Not Currently    Birth control/protection: Surgical  Other Topics Concern   Not on file  Social History Narrative   Patient lives alone   No children.   Works at [employer].   Enjoys going to [employer], going to estate sells.   Right handed   Caffeine: 1 Dr. Nunzio all day long (5-6)   Social Drivers of Health   Financial Resource Strain: Low Risk  (05/15/2024)   Overall Financial Resource Strain (CARDIA)    Difficulty of Paying Living Expenses: Not hard at all  Food Insecurity: No Food Insecurity (05/15/2024)   Hunger Vital Sign    Worried About Running Out of Food in the Last Year: Never true    Ran Out of Food in the Last Year: Never true  Transportation Needs: No Transportation Needs (05/15/2024)   PRAPARE - Administrator, Civil Service (Medical): No    Lack of Transportation (Non-Medical): No  Physical Activity: Inactive (05/15/2024)   Exercise Vital Sign    Days of Exercise per Week: 0 days    Minutes of Exercise per Session: Not on file  Stress: Stress Concern Present (05/15/2024)   [employer] of Occupational  Health - Occupational Stress Questionnaire    Feeling of Stress: Rather much  Social Connections: Socially Isolated (05/15/2024)   Social Connection and Isolation Panel    Frequency of Communication with Friends and Family: Three times a week    Frequency of Social Gatherings with Friends and Family: Patient declined    Attends Religious Services: Never    Database administrator or Organizations: No    Attends Engineer, structural: Not on file    Marital Status: Widowed  Intimate Partner Violence: Not on file    Past Surgical History:  Procedure Laterality Date   ABDOMINAL HYSTERECTOMY  2012   COLONOSCOPY     COLONOSCOPY WITH PROPOFOL  N/A 04/24/2019   Procedure: COLONOSCOPY WITH PROPOFOL ;  Surgeon: Janalyn Keene NOVAK, MD;  Location: [facility] END[facility];  Service: Endoscopy;   Laterality: N/A;   ESOPHAGOGASTRODUODENOSCOPY (EGD) WITH PROPOFOL  N/A 04/24/2019   Procedure: ESOPHAGOGASTRODUODENOSCOPY (EGD) WITH PROPOFOL ;  Surgeon: Janalyn Keene NOVAK, MD;  Location: [facility] ENDOSCOPY;  Service: Endoscopy;  Laterality: N/A;   FOOT SURGERY Right 01/24/2022   HAND SURGERY Right 05/07/2020   trigger release   UPPER GASTROINTESTINAL ENDOSCOPY      Family History  Problem Relation Age of Onset   Hypertension Mother    Colon cancer Neg Hx    Esophageal cancer Neg Hx    Stomach cancer Neg Hx    Rectal cancer Neg Hx     Allergies  Allergen Reactions   Asa Buff (Mag [Buffered Aspirin ] Other (See Comments)    Stomach pain   Aspirin  Other (See Comments)   Azithromycin Other (See Comments)    GI problems   Ibuprofen Other (See Comments)    GI upset   Penicillins Other (See Comments)    Unknown childhood allergic reaction Has patient had a PCN reaction causing immediate rash, facial/tongue/throat swelling, SOB or lightheadedness with hypotension: Unknown Has patient had a PCN reaction causing severe rash involving mucus membranes or skin necrosis: Unknown Has patient had a PCN reaction that required hospitalization: Unknown Has patient had a PCN reaction occurring within the last 10 years: No If all of the above answers are NO, then may proceed with Cephalosporin use.    Current Outpatient Medications on File Prior to Visit  Medication Sig Dispense Refill   albuterol  (VENTOLIN  HFA) 108 (90 Base) MCG/ACT inhaler Inhale 2 puffs into the lungs every 6 (six) hours as needed for wheezing or shortness of breath. 6.7 g 0   clobetasol  cream (TEMOVATE ) 0.05 % APPLY  CREAM TOPICALLY TO AFFECTED AREA TWICE DAILY 30 g 0   cyclobenzaprine  (FLEXERIL ) 5 MG tablet Take 1 tablet (5 mg total) by mouth 3 (three) times daily as needed for muscle spasms. 30 tablet 0   escitalopram  (LEXAPRO ) 20 MG tablet TAKE 1 TABLET BY MOUTH ONCE DAILY FOR ANXIETY 90 tablet 2   gabapentin  (NEURONTIN )  100 MG capsule Take 1 to 2 capsules by mouth up to 3 times daily for back pain. 90 capsule 0   ondansetron  (ZOFRAN -ODT) 4 MG disintegrating tablet Take 1 tablet (4 mg total) by mouth every 8 (eight) hours as needed for nausea or vomiting. 15 tablet 0   PHENobarbital  (LUMINAL) 97.2 MG tablet Take 2 tablets (194.4 mg total) by mouth daily. 180 tablet 0   rizatriptan  (MAXALT -MLT) 10 MG disintegrating tablet DISSOLVE 1 TABLET IN MOUTH ONCE DAILY AS NEEDED FOR MIGRAINE. MAY REPEAT IN 2 HOURS IF NEEDED 9 tablet 0   Vitamin D , Ergocalciferol , (DRISDOL ) 1.25 MG (50000  UNIT) CAPS capsule TAKE 1 CAPSULE BY MOUTH ONCE A WEEK FOR  12  WEEKS 12 capsule 0   No current facility-administered medications on file prior to visit.    BP 126/82   Pulse 81   Temp (!) 97.2 F (36.2 C) (Temporal)   Ht 5' 5 (1.651 m)   Wt 181 lb (82.1 kg)   SpO2 98%   BMI 30.12 kg/m  Objective:   Physical Exam Cardiovascular:     Rate and Rhythm: Normal rate.  Pulmonary:     Effort: Pulmonary effort is normal.  Skin:    General: Skin is warm and dry.     Comments: Lips appear dry, no lesions or openings in skin.     Physical Exam        Assessment & Plan:  Chapped lips Assessment & Plan: Unclear cause.  We discussed to avoid licking lips.  Start hydrocortisone  1% ointment twice daily x 1 week.  We discussed caution with overuse. She will follow-up with dermatology November schedule.   Orders: -     Hydrocortisone ; Apply 1 Application topically 2 (two) times daily.  Dispense: 28 g; Refill: 0    Assessment and Plan Assessment & Plan         Comer MARLA Gaskins, NP     History of Present Illness

## 2024-05-16 NOTE — Patient Instructions (Signed)
 Start hydrocortisone  1% ointment twice daily for 1 to 2 weeks for chapped lips. Do not use for more than 2 weeks consistently.  Follow-up with dermatology as scheduled  Avoid licking your lips as a saliva can cause irritation.  It was a pleasure to see you today!

## 2024-05-20 ENCOUNTER — Other Ambulatory Visit: Payer: Self-pay | Admitting: Primary Care

## 2024-05-20 DIAGNOSIS — Z1231 Encounter for screening mammogram for malignant neoplasm of breast: Secondary | ICD-10-CM

## 2024-06-13 ENCOUNTER — Ambulatory Visit: Admitting: Podiatry

## 2024-06-16 ENCOUNTER — Ambulatory Visit: Admitting: Primary Care

## 2024-06-17 ENCOUNTER — Ambulatory Visit
Admission: RE | Admit: 2024-06-17 | Discharge: 2024-06-17 | Disposition: A | Discharge status: Home / Self Care | Source: Ambulatory Visit | Attending: Primary Care | Admitting: Primary Care

## 2024-06-17 DIAGNOSIS — Z1231 Encounter for screening mammogram for malignant neoplasm of breast: Secondary | ICD-10-CM

## 2024-06-22 ENCOUNTER — Ambulatory Visit: Payer: Self-pay | Admitting: Primary Care

## 2024-06-30 ENCOUNTER — Ambulatory Visit: Admitting: Physician Assistant

## 2024-06-30 ENCOUNTER — Other Ambulatory Visit: Payer: Self-pay | Admitting: Neurology

## 2024-06-30 ENCOUNTER — Encounter: Payer: Self-pay | Admitting: Physician Assistant

## 2024-06-30 ENCOUNTER — Encounter: Payer: Self-pay | Admitting: Radiology

## 2024-06-30 VITALS — BP 119/80

## 2024-06-30 DIAGNOSIS — L700 Acne vulgaris: Secondary | ICD-10-CM | POA: Diagnosis not present

## 2024-06-30 DIAGNOSIS — G40B09 Juvenile myoclonic epilepsy, not intractable, without status epilepticus: Secondary | ICD-10-CM

## 2024-06-30 DIAGNOSIS — L905 Scar conditions and fibrosis of skin: Secondary | ICD-10-CM

## 2024-06-30 MED ORDER — TRETINOIN 0.1 % EX CREA: TOPICAL_CREAM | Freq: Every day | CUTANEOUS | 4 refills | Status: AC

## 2024-06-30 MED ORDER — DOXYCYCLINE HYCLATE 100 MG PO TABS: 100.0000 mg | ORAL_TABLET | Freq: Every day | ORAL | 1 refills | Status: AC

## 2024-06-30 NOTE — Patient Instructions (Addendum)
     Doxycycline  should be taken with food to prevent nausea. Do not lay down for 30 minutes after taking. Be cautious with sun exposure and use good sun protection while on this medication. Pregnant women should not take this medication.    Topical retinoid medications like tretinoin/Retin-A, adapalene/Differin, tazarotene/Fabior, and Epiduo/Epiduo Forte can cause dryness and irritation when first started. Only apply a pea-sized amount to the entire affected area. Avoid applying it around the eyes, edges of mouth and creases at the nose. If you experience irritation, use a good moisturizer first and/or apply the medicine less often. If you are doing well with the medicine, you can increase how often you use it until you are applying every night. Be careful with sun protection while using this medication as it can make you sensitive to the sun. This medicine should not be used by pregnant women.     VISIT SUMMARY: Today, we discussed your chronic acne and its impact on your life. You shared that you have been dealing with acne for a long time and have tried various over-the-counter treatments without much success. We reviewed your current skincare routine and medical history, including your allergies and seizure medication. We have now developed a treatment plan to help manage your acne and improve your skin condition.  YOUR PLAN: -ACNE VULGARIS WITH FACIAL SCARRING: Acne vulgaris is a common skin condition that occurs when hair follicles become clogged with oil and dead skin cells, leading to pimples, blackheads, and sometimes cysts. Your treatment plan includes using tretinoin cream every other night initially, then nightly as tolerated, to help reduce acne and improve skin texture. You will also take doxycycline  100 mg daily with food for two months to reduce inflammation and bacteria. Additionally, you should use a benzoyl peroxide wash three times a week in the morning and Neutrogena Betadine  cleanser twice daily on the other days. We will follow up in six months to see how well the treatment is working and make any necessary adjustments.  INSTRUCTIONS: Please follow the prescribed treatment plan and use the medications as directed. We will see you again in six months to evaluate your progress. If you experience any side effects or have any concerns before then, do not hesitate to contact our office.                      Contains text generated by Abridge.                                 Contains text generated by Abridge.

## 2024-06-30 NOTE — Telephone Encounter (Signed)
 Last seen on 03/12/23 Follow up scheduled on 12/15/24    Dispensed Days Supply Quantity Provider Pharmacy  PHENOBARBITAL  97.2MG  TAB 04/03/2024 90 180 each Rosemarie Eather RAMAN, MD [facility]...     Rx pending to be signed   Dr.Sethi out of the office

## 2024-06-30 NOTE — Progress Notes (Signed)
      Discussed the use of AI scribe software for clinical note transcription with the patient, who gave verbal consent to proceed.  History of Present Illness [name] is a 21 year NEW PATIENT old female who presents with chronic acne.  She has experienced acne throughout her life, primarily using over-the-counter treatments. She rates her acne severity as a 'ten' on a scale of one to ten and has not previously consulted a dermatologist. She has scarring from past acne episodes, including a cyst removal from her back several years ago.  She experiences itching in some areas and has a history of skin picking, which she attempts to avoid now. Her skincare routine includes using Neutrogena wipes for makeup removal and washing her face once daily. She has not used any prescription treatments for her acne.  She has a known allergy  to penicillin and aspirin . She takes phenobarbital  daily for seizure control, a regimen she has followed since the age of 54. She has been seizure-free since she was [age].  She resides in [place] and commutes to work in [place].    Physical Exam SKIN: Open and closed comedones with facial scarring.    Assessment & Plan  Acne vulgaris with facial scarring Chronic acne with severe self-reported symptoms. No prior dermatological consultation.  - Prescribed tretinoin for nightly use, starting every other night, increasing as tolerated. - Prescribed doxycycline  100 mg daily with food for two months. - Advised benzoyl peroxide wash three times weekly in the morning. - Recommended Neutrogena cleanser twice daily on non-benzoyl peroxide days. - Scheduled follow-up in six months to assess treatment efficacy and tolerance.

## 2024-06-30 NOTE — Progress Notes (Signed)
   New Patient Visit   Subjective  [name] is a 53 y.o. female who presents for the following: Acne Vulgaris - She has had acne since she was a teenager. She has never tried any prescription medications for acne but has used OTC products which have not helped. She has some scarring from previous acne.    The following portions of the chart were reviewed this encounter and updated as appropriate: medications, allergies, medical history  Review of Systems:  No other skin or systemic complaints except as noted in HPI or Assessment and Plan.  Objective  Well appearing patient in no apparent distress; mood and affect are within normal limits.  Areas Examined: Face, chest and back  Relevant exam findings are noted in the Assessment and Plan.   Assessment & Plan   ACNE VULGARIS   ACNE SCARRING   ACNE VULGARIS Exam: Open comedones and inflammatory papules of face   Treatment Plan: Recommend Panoxyl 10 wash in the morning Monday, Wednesday, Friday.  Start doxycycline  100 mg 1 tablet daily in the evening with food and plenty of fluid x 2 months.  Start tretinoin 0.1% cream apply pea size amount to face at bedtime every other night and increase as tolerated.     Return in about 6 months (around 12/28/2024) for Acne.  I, Roseline Hutchinson, CMA, am acting as scribe for SANDRIDGE,BRENDA K, PA-C .   Documentation: I have reviewed the above documentation for accuracy and completeness, and I agree with the above.  SANDRIDGE,BRENDA K, PA-C

## 2024-08-01 ENCOUNTER — Ambulatory Visit: Admitting: Primary Care

## 2024-08-07 NOTE — Progress Notes (Unsigned)
 No chief complaint on file.   HISTORY OF PRESENT ILLNESS:  08/07/2024 ALL:  Nakeyia returns for follow up for seizures, headaches and OSA. She was last seen 02/2023. Phenobarbital  continued and Qulitpa started for migraine prevention. Since,    Rizatriptan ?  She denies seizure activity. She is tolerating phenobarbital  194.4mg  daily.    03/12/2023 ALL:  Kitt returns for follow up for seizures and OSA. She was last seen 07/2022 and doing well on phenobarbital  194.4mg  daily. She reported continued headaches. PCP had started amitriptyline  just prior to our appt but she had not started. She reported being unable to afford CPAP and was going to speak to her dentist regarding an oral appliance.   Since, she continues to have daily tension style headaches. She has 12 migrainous headaches, on average, per month. Rizatriptan  works well for abortive therapy. She was unable to afford copay for CPAP. She has not spoken to dentist about oral appliance. No seizure activity.   Tried and failed: Ajovy , amitriptyline , topiramate , propranolol , rizatriptan    08/16/2022 ALL: Alexa returns for follow up for headaches, seizures and OSA. She was last seen by me 02/2021 and reported worsening headaches. Sleep referral placed and She was seen in consult with Dr Buck 07/2021. HST 07/2021 showed moderate OSA with total AHI 15.4/h, REM AHI 29.6/h. CPAP advised. She reports not being able to afford a machine. She was going to have to pay a deposit and pay 75 per month. She lives alone and could not afford.   She has discontinued propranolol  LA 80mg  daily. She did not feel it was helpful. Dr Gretta, PCP, started amitriptyline  at follow up yesterday. She has not started, yet. She continues rizatriptan  as needed. She continues to have daily headaches. She may have 2-3 severe migraines per month.   She continues phenobarbital  194.4mg  daily for seizure management. No recent seizure activity. She is tolerating med well with no  obvious adverse effects.   03/14/2021 ALL: Tinslee Klare is a 53 y.o. female here today for follow up for headaches and seizures. She continues phenobarbital  2 tablets daily. No recent seizures. She continues to have daily headaches. Most occur in the mornings or when she wakes from sleep. She was seen by PCP for excessive daytime sleepiness. She was missing work due to not being able to wake up. She is taking Tylenol  daily, usually about 8 - 500mg  tablets. She has about 1 migraine per month. Rizatriptan  usually aborts migraine. She was told by her late husband that she snores. She denies family history of sleep apnea. She was seen by Dr Buck in 2017 and sleep study advised but she did not pursue.    HISTORY (copied from Dr Chancy previous note)  UPDATE (02/09/20, VRP): Since last visit, doing about the same. Daily low grade HA, but rare migraine. No longer on ajovy  (hard to take shots on her own). Symptoms are stable. No seizures.   PRIOR HPI (12/25/18): - doing well; no seizures - HA are improving on ajovy  - unfortunately patient's husband deceased now (from cancer) - overall stable   UPDATE (12/31/17, VRP): Since last visit, doing the same with HA (daily basis). Tried TPX, but HA worse. Rizatriptan  helps. Tried lower PB, but had muscle spasms / myoclonus, but no grand mal seizures.    UPDATE (09/02/16, VRP): Since last visit, doing well. No seizures. Tolerating meds. No alleviating or aggravating factors. In last 3 weeks has had more HA / migraines (daily). Rizatriptan  and tylenol  not helping that  much. Had cut down tylenol  for a while, but now back up to 6 tabs per day. Drinking 1 soda per day. Eating 3 times per day. Sleep is a little better.   UPDATE (09/02/16, VRP): Since last visit, doing well --> no seizures. Tolerating meds. No alleviating or aggravating factors. Has had 3 weeks of consistent headaches. Taking up to tylenol  x 6 tabs per day.   UPDATE 06/26/16: Since last visit,  doing well. No seizures. Sleep is improved on her own. Tried on amitriptyline , but HA felt worse, so she stopped it. Having 2-3 migraine per month, well controlled with rizatriptan .   UPDATE 12/24/15: Since last visit, no seizures. Still with daily headaches. Also with 1 severe migraine per month. Eating 2 x per day. Soda (1 per day). Still with 6 tylenol  tabs per day. Amitriptyline  helped some in the beginning, but seems to be less effective now.   UPDATE 10/22/15: Since last visit, no seizures. Has cut down soda (now 1-2 per day), has quit smoking, and now exercising some. Still only eats 1 meal per day. Still taking 5-9 tabs tylenol  on daily basis. Has 1 migraine per month (pounding global HA, sens to light / sound). Also with daily lower level headaches. Still poor sleep quality.   UPDATE 10/21/14: Since last visit, no seizures. C/o daily headaches, and taking daily Tylenol . She has interrupted sleep at nighttime. She continues to drink 5-6 Dr. Nunzio cans per day. She eats once per day. She has increased stress related to work. Fortunately patient has been able to quit smoking cigarettes since 08/28/2014.   UPDATE 10/21/13: Since last visit, no new events or seizures. Now has [facility], but no PCP. Tolerating PB 97.2mg  tabs (2 tabs qAM).   UPDATE 05/29/12: No seizure. No PCP. No insurance.   UPDATE 12/22/10: No seizure since age 29's.  Tolerating PB, and wants to stay on it.  Having 1 migraine every 1-2 years (global, severe, pressure, photophobia, phonophobia).  Usually midrin helps.  Never tried triptan.  Also using tylenol  for tension HA.  Last migraine Aug 2011.   PRIOR HPI (12/15/08, Dr. Susen): Keaja is a 53 year old married woman with a long-standing history of juvenile myoclonic epilepsy.  She has not had seizures for years.  The only manifestation of her symptoms has been generalized tonic-clonic seizures.  She has taken and tolerated Phenobarbital  without significant side effects.  Patient has daily headaches that I think are more tension type in nature than migraine.  Become on later in the day except when she has stress. Since her last visit, she had hysterectomy and oophorectomy for removal of an ovarian cyst.  She was noted to have scarred fallopian tubes at the time of operation.   REVIEW OF SYSTEMS: Out of a complete 14 system review of symptoms, the patient complains only of the following symptoms, headaches, anxiety and all other reviewed systems are negative.   ALLERGIES: Allergies  Allergen Reactions   Asa Buff (Mag [Buffered Aspirin ] Other (See Comments)    Stomach pain   Aspirin  Other (See Comments)   Azithromycin Other (See Comments)    GI problems   Ibuprofen Other (See Comments)    GI upset   Penicillins Other (See Comments)    Unknown childhood allergic reaction Has patient had a PCN reaction causing immediate rash, facial/tongue/throat swelling, SOB or lightheadedness with hypotension: Unknown Has patient had a PCN reaction causing severe rash involving mucus membranes or skin necrosis: Unknown Has patient had a PCN reaction  that required hospitalization: Unknown Has patient had a PCN reaction occurring within the last 10 years: No If all of the above answers are NO, then may proceed with Cephalosporin use.     HOME MEDICATIONS: Outpatient Medications Prior to Visit  Medication Sig Dispense Refill   albuterol  (VENTOLIN  HFA) 108 (90 Base) MCG/ACT inhaler Inhale 2 puffs into the lungs every 6 (six) hours as needed for wheezing or shortness of breath. 6.7 g 0   clobetasol  cream (TEMOVATE ) 0.05 % APPLY  CREAM TOPICALLY TO AFFECTED AREA TWICE DAILY 30 g 0   doxycycline  (VIBRA -TABS) 100 MG tablet Take 1 tablet (100 mg total) by mouth daily. With food and plenty of fluid 30 tablet 1   escitalopram  (LEXAPRO ) 20 MG tablet TAKE 1 TABLET BY MOUTH ONCE DAILY FOR ANXIETY 90 tablet 2   ondansetron  (ZOFRAN -ODT) 4 MG disintegrating tablet Take 1 tablet (4  mg total) by mouth every 8 (eight) hours as needed for nausea or vomiting. 15 tablet 0   PHENobarbital  (LUMINAL) 97.2 MG tablet Take 2 tablets by mouth once daily 180 tablet 0   rizatriptan  (MAXALT -MLT) 10 MG disintegrating tablet DISSOLVE 1 TABLET IN MOUTH ONCE DAILY AS NEEDED FOR MIGRAINE. MAY REPEAT IN 2 HOURS IF NEEDED 9 tablet 0   tretinoin  (RETIN-A ) 0.1 % cream Apply topically at bedtime. Apply pea size amount to face every other night and increase as tolerated. 45 g 4   No facility-administered medications prior to visit.     PAST MEDICAL HISTORY: Past Medical History:  Diagnosis Date   Acute neck pain 05/05/2021   Allergy     Anxiety    Asthma    worse when younger   Cellulitis of face 05/05/2021   COVID-19 virus infection 02/16/2023   Elevated blood pressure reading 08/13/2019   GERD (gastroesophageal reflux disease)    Migraine    1-2x/month   Persistent cough for 3 weeks or longer 10/09/2019   PONV (postoperative nausea and vomiting)    Seizure (HCC)    Vaginal itching 05/27/2020   Vertigo    no episodes for 7 yrs     PAST SURGICAL HISTORY: Past Surgical History:  Procedure Laterality Date   ABDOMINAL HYSTERECTOMY  2012   COLONOSCOPY     COLONOSCOPY WITH PROPOFOL  N/A 04/24/2019   Procedure: COLONOSCOPY WITH PROPOFOL ;  Surgeon: Janalyn Keene NOVAK, MD;  Location: [facility];  Service: Endoscopy;  Laterality: N/A;   ESOPHAGOGASTRODUODENOSCOPY (EGD) WITH PROPOFOL  N/A 04/24/2019   Procedure: ESOPHAGOGASTRODUODENOSCOPY (EGD) WITH PROPOFOL ;  Surgeon: Janalyn Keene NOVAK, MD;  Location: [facility] ENDOSCOPY;  Service: Endoscopy;  Laterality: N/A;   FOOT SURGERY Right 01/24/2022   HAND SURGERY Right 05/07/2020   trigger release   UPPER GASTROINTESTINAL ENDOSCOPY       FAMILY HISTORY: Family History  Problem Relation Age of Onset   Hypertension Mother    Colon cancer Neg Hx    Esophageal cancer Neg Hx    Stomach cancer Neg Hx    Rectal cancer Neg Hx    Breast  cancer Neg Hx      SOCIAL HISTORY: Social History   Socioeconomic History   Marital status: Widowed    Spouse name: Dick   Number of children: 0   Years of education: 10th   Highest education level: 12th grade  Occupational History    Employer: [id number]  Tobacco Use   Smoking status: Former    Current packs/day: 0.00    Average packs/day: 1.0 packs/day    Types: Cigarettes  Quit date: 08/28/2013    Years since quitting: 10.9   Smokeless tobacco: Never   Tobacco comments:    Quit Janurary 1st  Vaping Use   Vaping status: Never Used  Substance and Sexual Activity   Alcohol use: No    Alcohol/week: 0.0 standard drinks of alcohol   Drug use: No   Sexual activity: Not Currently    Birth control/protection: Surgical  Other Topics Concern   Not on file  Social History Narrative   Patient lives alone   No children.   Works at [employer].   Enjoys going to [employer], going to estate sells.   Right handed   Caffeine: 1 Dr. Nunzio all day long (5-6)   Social Drivers of Health   Tobacco Use: Medium Risk (06/30/2024)   Patient History    Smoking Tobacco Use: Former    Smokeless Tobacco Use: Never    Passive Exposure: Not on file  Financial Resource Strain: Low Risk (05/15/2024)   Overall Financial Resource Strain (CARDIA)    Difficulty of Paying Living Expenses: Not hard at all  Food Insecurity: No Food Insecurity (05/15/2024)   Epic    Worried About Programme Researcher, Broadcasting/film/video in the Last Year: Never true    Ran Out of Food in the Last Year: Never true  Transportation Needs: No Transportation Needs (05/15/2024)   Epic    Lack of Transportation (Medical): No    Lack of Transportation (Non-Medical): No  Physical Activity: Inactive (05/15/2024)   Exercise Vital Sign    Days of Exercise per Week: 0 days    Minutes of Exercise per Session: Not on file  Stress: Stress Concern Present (05/15/2024)   [employer] of Occupational Health - Occupational Stress Questionnaire    Feeling  of Stress: Rather much  Social Connections: Socially Isolated (05/15/2024)   Social Connection and Isolation Panel    Frequency of Communication with Friends and Family: Three times a week    Frequency of Social Gatherings with Friends and Family: Patient declined    Attends Religious Services: Never    Database Administrator or Organizations: No    Attends Engineer, Structural: Not on file    Marital Status: Widowed  Intimate Partner Violence: Not on file  Depression (PHQ2-9): Low Risk (11/15/2023)   Depression (PHQ2-9)    PHQ-2 Score: 0  Alcohol Screen: Not on file  Housing: Unknown (05/15/2024)   Epic    Unable to Pay for Housing in the Last Year: No    Number of Times Moved in the Last Year: Not on file    Homeless in the Last Year: No  Utilities: Not on file  Health Literacy: Not on file     PHYSICAL EXAM  There were no vitals filed for this visit.    There is no height or weight on file to calculate BMI.   Generalized: Well developed, in no acute distress  Cardiology: normal rate and rhythm, no murmur auscultated  Respiratory: clear to auscultation bilaterally    Neurological examination  Mentation: Alert oriented to time, place, history taking. Follows all commands speech and language fluent Cranial nerve II-XII: Pupils were equal round reactive to light. Extraocular movements were full, visual field were full on confrontational test. Facial sensation and strength were normal. Uvula tongue midline. Head turning and shoulder shrug  were normal and symmetric. Motor: The motor testing reveals 5 over 5 strength of all 4 extremities. Good symmetric motor tone is noted throughout.  Sensory: Sensory testing is intact to soft touch on all 4 extremities. No evidence of extinction is noted.  Coordination: Cerebellar testing reveals good finger-nose-finger and heel-to-shin bilaterally.  Gait and station: Gait is normal. Tandem gait is normal. Romberg is negative. No  drift is seen.  Reflexes: Deep tendon reflexes are symmetric and normal bilaterally.    DIAGNOSTIC DATA (LABS, IMAGING, TESTING) - I reviewed patient records, labs, notes, testing and imaging myself where available.  Lab Results  Component Value Date   WBC 7.5 03/16/2022   HGB 13.9 03/16/2022   HCT 41.4 03/16/2022   MCV 93.3 03/16/2022   PLT 212.0 03/16/2022      Component Value Date/Time   NA 139 10/02/2023 1605   NA 140 10/22/2015 1206   K 3.8 10/02/2023 1605   CL 102 10/02/2023 1605   CO2 27 10/02/2023 1605   GLUCOSE 87 10/02/2023 1605   BUN 11 10/02/2023 1605   BUN 9 10/22/2015 1206   CREATININE 0.68 10/02/2023 1605   CREATININE 0.63 11/30/2017 1549   CALCIUM 9.0 10/02/2023 1605   PROT 7.0 10/02/2023 1605   PROT 6.5 10/22/2015 1206   ALBUMIN 4.3 10/02/2023 1605   ALBUMIN 4.1 10/22/2015 1206   AST 12 10/02/2023 1605   ALT 10 10/02/2023 1605   ALKPHOS 77 10/02/2023 1605   BILITOT 0.3 10/02/2023 1605   BILITOT 0.2 10/22/2015 1206   GFRNONAA >60 08/31/2021 0610   GFRAA >60 01/08/2020 2145   Lab Results  Component Value Date   CHOL 191 10/02/2023   HDL 48.10 10/02/2023   LDLCALC 99 10/02/2023   LDLDIRECT 167.0 08/15/2022   TRIG 219.0 (H) 10/02/2023   CHOLHDL 4 10/02/2023   Lab Results  Component Value Date   HGBA1C 5.5 03/16/2022   Lab Results  Component Value Date   VITAMINB12 301 10/02/2023   Lab Results  Component Value Date   TSH 1.44 02/18/2021        No data to display               No data to display           ASSESSMENT AND PLAN  53 y.o. year old female  has a past medical history of Acute neck pain (05/05/2021), Allergy , Anxiety, Asthma, Cellulitis of face (05/05/2021), COVID-19 virus infection (02/16/2023), Elevated blood pressure reading (08/13/2019), GERD (gastroesophageal reflux disease), Migraine, Persistent cough for 3 weeks or longer (10/09/2019), PONV (postoperative nausea and vomiting), Seizure (HCC), Vaginal itching  (05/27/2020), and Vertigo. here with    No diagnosis found.  Kenia is doing well from a seizure perspective. We will continue phenobarbital  194.4mg  daily. Refill sent. We have reviewed HST results and discussed management of sleep apneas as well as adverse effects of unmanaged sleep apnea. She does not feel she can afford a CPAP machine. We have discussed consideration of an oral appliance. She will discuss with her dentist and let me know if referral is needed. I will try her on Qulipta  60mg  daily. Previously did well with Ajovy  but unable to continue giving herself injections following loss of her husband. She will continue rizatriptan  as needed for abortive therapy. Complementary therapies reviewed for headache management. Healthy lifestyle habits encouraged. She will follow up with me in 6 months, sooner if needed. She verbalizes understanding and agreement with this plan.    No orders of the defined types were placed in this encounter.    No orders of the defined types were placed in this encounter.  Greig Forbes, MSN, FNP-C 08/07/2024, 8:00 AM  [facility] [address], [address] [place], [place] [address] [phone]

## 2024-08-08 ENCOUNTER — Encounter: Payer: Self-pay | Admitting: Family Medicine

## 2024-08-08 ENCOUNTER — Ambulatory Visit: Admitting: Family Medicine

## 2024-08-08 ENCOUNTER — Ambulatory Visit: Admitting: Primary Care

## 2024-08-08 ENCOUNTER — Ambulatory Visit: Payer: Self-pay | Admitting: Primary Care

## 2024-08-08 ENCOUNTER — Encounter: Payer: Self-pay | Admitting: Primary Care

## 2024-08-08 VITALS — BP 126/84 | HR 83 | Ht 65.0 in | Wt 181.0 lb

## 2024-08-08 VITALS — BP 126/74 | HR 83 | Temp 97.9°F | Ht 65.0 in | Wt 182.1 lb

## 2024-08-08 DIAGNOSIS — J020 Streptococcal pharyngitis: Secondary | ICD-10-CM

## 2024-08-08 DIAGNOSIS — J029 Acute pharyngitis, unspecified: Secondary | ICD-10-CM | POA: Diagnosis not present

## 2024-08-08 DIAGNOSIS — G40B09 Juvenile myoclonic epilepsy, not intractable, without status epilepticus: Secondary | ICD-10-CM

## 2024-08-08 DIAGNOSIS — G43009 Migraine without aura, not intractable, without status migrainosus: Secondary | ICD-10-CM | POA: Diagnosis not present

## 2024-08-08 DIAGNOSIS — R21 Rash and other nonspecific skin eruption: Secondary | ICD-10-CM

## 2024-08-08 DIAGNOSIS — G4733 Obstructive sleep apnea (adult) (pediatric): Secondary | ICD-10-CM

## 2024-08-08 DIAGNOSIS — F411 Generalized anxiety disorder: Secondary | ICD-10-CM | POA: Diagnosis not present

## 2024-08-08 LAB — POCT RAPID STREP A (OFFICE): Rapid Strep A Screen: POSITIVE — AB

## 2024-08-08 MED ORDER — PHENOBARBITAL 97.2 MG PO TABS: 194.4000 mg | ORAL_TABLET | Freq: Every day | ORAL | 1 refills | Status: AC

## 2024-08-08 MED ORDER — RIZATRIPTAN BENZOATE 10 MG PO TBDP: ORAL_TABLET | ORAL | 11 refills | Status: AC

## 2024-08-08 MED ORDER — PREDNISONE 20 MG PO TABS: ORAL_TABLET | ORAL | 0 refills | Status: AC

## 2024-08-08 MED ORDER — BUSPIRONE HCL 5 MG PO TABS: 5.0000 mg | ORAL_TABLET | Freq: Two times a day (BID) | ORAL | 0 refills | Status: AC

## 2024-08-08 MED ORDER — CLINDAMYCIN HCL 300 MG PO CAPS: 300.0000 mg | ORAL_CAPSULE | Freq: Three times a day (TID) | ORAL | 0 refills | Status: AC

## 2024-08-08 NOTE — Progress Notes (Signed)
 Subjective:    Patient ID: [name], female    DOB: 1971-06-19, 53 y.o.   MRN: [id number]  [name] is a very pleasant 53 y.o. female with a history of migraines, seizure disorder, asthma, hot flashes, GAD, fatigue who presents today to discuss several concerns.  1) GAD: Currently managed on Lexapro  20 mg daily for chronic anxiety. She would like to discuss changing her anxiety medication to something else.   Over the last few months her anxiety has been uncontrolled with symptoms of irritability, mood swings, mind racing thoughts, hot flashes. She's snapping at everyone and finds most everyone frustrating. She's been taking double of her Lexapro  on occasion without improvement. She does typically have depression this time of year since the passing of her husband.  Her Lexapro  has helped her historically except for the last few months.   2) Rash: Chronic history for the last 4-6 months, located to the posterior neck with radiation to the occipital skull.  Her rash is itchy and bothers her a lot. She's also noticed itching and a rash to the elbow which originally began about 3 months ago, just returned one week ago.  She has been scratching to the base of her neck and also to her elbow  She's applied clobetasol  cream to the elbow without improvement.  She does not take an antihistamine.  No new lotions, detergents, soaps or shampoos. No new medicines, vitamins, supplements. No new pets. No recent outdoor exposure or poison ivy exposure. No bonfire or smoke exposure.  No recent motel or hotel stay or new beds.   No fevers/chills, oral lesions, new joint pains, tick bites, abdominal pain, nausea.   3) Sore Throat: Acute onset 3 days ago with a slight burning sensation.  She denies esophageal reflux type burning, fevers, chills, body aches, cough  Review of Systems  Constitutional:  Negative for chills and fever.  HENT:  Positive for sore throat.   Respiratory:   Negative for cough.   Genitourinary:        Hot flashes  Skin:  Positive for rash.  Psychiatric/Behavioral:  Positive for sleep disturbance. The patient is nervous/anxious.        See HPI         Past Medical History:  Diagnosis Date   Acute neck pain 05/05/2021   Allergy     Anxiety    Asthma    worse when younger   Cellulitis of face 05/05/2021   COVID-19 virus infection 02/16/2023   Elevated blood pressure reading 08/13/2019   GERD (gastroesophageal reflux disease)    Migraine    1-2x/month   Persistent cough for 3 weeks or longer 10/09/2019   PONV (postoperative nausea and vomiting)    Seizure (HCC)    Vaginal itching 05/27/2020   Vertigo    no episodes for 7 yrs    Social History   Socioeconomic History   Marital status: Widowed    Spouse name: Dick   Number of children: 0   Years of education: 10th   Highest education level: 12th grade  Occupational History    Employer: [id number]  Tobacco Use   Smoking status: Former    Current packs/day: 0.00    Average packs/day: 1.0 packs/day    Types: Cigarettes    Quit date: 08/28/2013    Years since quitting: 10.9   Smokeless tobacco: Never   Tobacco comments:    Quit Janurary 1st  Vaping Use   Vaping status: Never Used  Substance and Sexual Activity   Alcohol use: No    Alcohol/week: 0.0 standard drinks of alcohol   Drug use: No   Sexual activity: Not Currently    Birth control/protection: Surgical  Other Topics Concern   Not on file  Social History Narrative   Patient lives alone   No children.   Works at [employer].   Enjoys going to [employer], going to estate sells.   Right handed   Caffeine: 1 Dr. Nunzio all day long (5-6)   Social Drivers of Health   Tobacco Use: Medium Risk (08/08/2024)   Patient History    Smoking Tobacco Use: Former    Smokeless Tobacco Use: Never    Passive Exposure: Not on file  Financial Resource Strain: Low Risk (08/07/2024)   Overall Financial Resource Strain (CARDIA)     Difficulty of Paying Living Expenses: Not hard at all  Food Insecurity: Patient Declined (08/07/2024)   Epic    Worried About Programme Researcher, Broadcasting/film/video in the Last Year: Patient declined    Barista in the Last Year: Patient declined  Transportation Needs: No Transportation Needs (08/07/2024)   Epic    Lack of Transportation (Medical): No    Lack of Transportation (Non-Medical): No  Physical Activity: Inactive (08/07/2024)   Exercise Vital Sign    Days of Exercise per Week: 0 days    Minutes of Exercise per Session: Not on file  Stress: Stress Concern Present (08/07/2024)   [employer] of Occupational Health - Occupational Stress Questionnaire    Feeling of Stress: Very much  Social Connections: Unknown (08/07/2024)   Social Connection and Isolation Panel    Frequency of Communication with Friends and Family: Twice a week    Frequency of Social Gatherings with Friends and Family: Patient declined    Attends Religious Services: Never    Database Administrator or Organizations: No    Attends Engineer, Structural: Not on file    Marital Status: Widowed  Recent Concern: Social Connections - Socially Isolated (05/15/2024)   Social Connection and Isolation Panel    Frequency of Communication with Friends and Family: Three times a week    Frequency of Social Gatherings with Friends and Family: Patient declined    Attends Religious Services: Never    Database Administrator or Organizations: No    Attends Engineer, Structural: Not on file    Marital Status: Widowed  Intimate Partner Violence: Not on file  Depression (PHQ2-9): Low Risk (08/08/2024)   Depression (PHQ2-9)    PHQ-2 Score: 0  Alcohol Screen: Not on file  Housing: Unknown (08/07/2024)   Epic    Unable to Pay for Housing in the Last Year: No    Number of Times Moved in the Last Year: Not on file    Homeless in the Last Year: Not on file  Utilities: Not on file  Health Literacy: Not on file     Past Surgical History:  Procedure Laterality Date   ABDOMINAL HYSTERECTOMY  2012   COLONOSCOPY     COLONOSCOPY WITH PROPOFOL  N/A 04/24/2019   Procedure: COLONOSCOPY WITH PROPOFOL ;  Surgeon: Janalyn Keene NOVAK, MD;  Location: [facility];  Service: Endoscopy;  Laterality: N/A;   ESOPHAGOGASTRODUODENOSCOPY (EGD) WITH PROPOFOL  N/A 04/24/2019   Procedure: ESOPHAGOGASTRODUODENOSCOPY (EGD) WITH PROPOFOL ;  Surgeon: Janalyn Keene NOVAK, MD;  Location: [facility];  Service: Endoscopy;  Laterality: N/A;   FOOT SURGERY Right 01/24/2022   HAND SURGERY Right 05/07/2020  trigger release   UPPER GASTROINTESTINAL ENDOSCOPY      Family History  Problem Relation Age of Onset   Stroke Mother    Hypertension Mother    Colon cancer Neg Hx    Esophageal cancer Neg Hx    Stomach cancer Neg Hx    Rectal cancer Neg Hx    Breast cancer Neg Hx    Seizures Neg Hx    Migraines Neg Hx    Sleep apnea Neg Hx     Allergies[1]  Medications Ordered Prior to Encounter[2]  BP 126/74   Pulse 83   Temp 97.9 F (36.6 C) (Oral)   Ht 5' 5 (1.651 m)   Wt 182 lb 2 oz (82.6 kg)   SpO2 97%   BMI 30.31 kg/m  Objective:   Physical Exam Constitutional:      Appearance: She is not ill-appearing.  HENT:     Right Ear: Tympanic membrane and ear canal normal.     Left Ear: Tympanic membrane and ear canal normal.     Nose: No mucosal edema.     Right Sinus: No maxillary sinus tenderness or frontal sinus tenderness.     Left Sinus: No maxillary sinus tenderness or frontal sinus tenderness.     Mouth/Throat:     Mouth: Mucous membranes are moist.     Pharynx: No posterior oropharyngeal erythema.  Eyes:     Conjunctiva/sclera: Conjunctivae normal.  Cardiovascular:     Rate and Rhythm: Normal rate and regular rhythm.  Pulmonary:     Effort: Pulmonary effort is normal.     Breath sounds: Normal breath sounds. No wheezing or rhonchi.  Musculoskeletal:     Cervical back: Neck supple.  Skin:     General: Skin is warm and dry.     Findings: Rash present.     Comments: Several small red patches with scabbing to posterior neck up to the mid occipital region bilaterally.  Scaly, scabbed patch to the left elbow.      Physical Exam        Assessment & Plan:  GAD (generalized anxiety disorder) Assessment & Plan: Deteriorated.  Will continue Lexapro  20 mg daily as this has historically worked. Add buspirone 5 mg once daily for irritability/mood.  She will update.  Orders: -     busPIRone HCl; Take 1 tablet (5 mg total) by mouth 2 (two) times daily. For mood  Dispense: 180 tablet; Refill: 0  Sore throat Assessment & Plan: Differentials include viral versus reflux.  Exam today with mild erythema. Rapid strep test positive.  Treat with clindamycin  300 mg 3 times daily x 10 days. Both penicillin and macrolide allergy   She will update  Orders: -     POCT rapid strep A  Rash and nonspecific skin eruption Assessment & Plan: Rashes today may relate eczema/psoriasis Interestingly though, clobetasol  has been ineffective.  Start prednisone  20 mg tablets. Take 2 tablets by mouth once daily in the morning for 5 days. If no improvement, or if the rash returns, we will proceed clobetasol  shampoo/solution.  She will update.  Orders: -     predniSONE ; Take 2 tablets by mouth once daily in the morning for 5 days.  Dispense: 10 tablet; Refill: 0  Strep pharyngitis -     Clindamycin  HCl; Take 1 capsule (300 mg total) by mouth 3 (three) times daily for 10 days.  Dispense: 30 capsule; Refill: 0    Assessment and Plan Assessment & Plan  Comer MARLA Gaskins, NP       [1]  Allergies Allergen Reactions   Dorethia Haley (Mag [Buffered Aspirin ] Other (See Comments)    Stomach pain   Aspirin  Other (See Comments)   Azithromycin Other (See Comments)    GI problems   Ibuprofen Other (See Comments)    GI upset   Penicillins Other (See Comments)    Unknown  childhood allergic reaction Has patient had a PCN reaction causing immediate rash, facial/tongue/throat swelling, SOB or lightheadedness with hypotension: Unknown Has patient had a PCN reaction causing severe rash involving mucus membranes or skin necrosis: Unknown Has patient had a PCN reaction that required hospitalization: Unknown Has patient had a PCN reaction occurring within the last 10 years: No If all of the above answers are NO, then may proceed with Cephalosporin use.  [2]  Current Outpatient Medications on File Prior to Visit  Medication Sig Dispense Refill   PHENobarbital  (LUMINAL) 97.2 MG tablet Take 2 tablets (194.4 mg total) by mouth daily. 180 tablet 1   rizatriptan  (MAXALT -MLT) 10 MG disintegrating tablet DISSOLVE 1 TABLET IN MOUTH ONCE DAILY AS NEEDED FOR MIGRAINE. MAY REPEAT IN 2 HOURS IF NEEDED 9 tablet 11   albuterol  (VENTOLIN  HFA) 108 (90 Base) MCG/ACT inhaler Inhale 2 puffs into the lungs every 6 (six) hours as needed for wheezing or shortness of breath. 6.7 g 0   clobetasol  cream (TEMOVATE ) 0.05 % APPLY  CREAM TOPICALLY TO AFFECTED AREA TWICE DAILY 30 g 0   doxycycline  (VIBRA -TABS) 100 MG tablet Take 1 tablet (100 mg total) by mouth daily. With food and plenty of fluid 30 tablet 1   escitalopram  (LEXAPRO ) 20 MG tablet TAKE 1 TABLET BY MOUTH ONCE DAILY FOR ANXIETY 90 tablet 2   ondansetron  (ZOFRAN -ODT) 4 MG disintegrating tablet Take 1 tablet (4 mg total) by mouth every 8 (eight) hours as needed for nausea or vomiting. 15 tablet 0   tretinoin  (RETIN-A ) 0.1 % cream Apply topically at bedtime. Apply pea size amount to face every other night and increase as tolerated. 45 g 4   No current facility-administered medications on file prior to visit.

## 2024-08-08 NOTE — Patient Instructions (Signed)
 Below is our plan:  We will continue rizatriptan  as needed and phenobarbital  194.4mg  daily. I will check labs, today.   Please make sure you are consistent with timing of seizure medication. I recommend annual visit with primary care provider (PCP) for complete physical and routine blood work. I recommend daily intake of vitamin D  (400-800iu) and calcium (800-1000mg ) for bone health. Discuss Dexa screening with PCP.   According to [place] law, you can not drive unless you are seizure / syncope free for at least 6 months and under physician's care.  Please maintain precautions. Do not participate in activities where a loss of awareness could harm you or someone else. No swimming alone, no tub bathing, no hot tubs, no driving, no operating motorized vehicles (cars, ATVs, motocycles, etc), lawnmowers, power tools or firearms. No standing at heights, such as rooftops, ladders or stairs. Avoid hot objects such as stoves, heaters, open fires. Wear a helmet when riding a bicycle, scooter, skateboard, etc. and avoid areas of traffic. Set your water heater to 120 degrees or less.  SUDEP is the sudden, unexpected death of someone with epilepsy, who was otherwise healthy. In SUDEP cases, no other cause of death is found when an autopsy is done. Each year, more than 1 in 1,000 people with epilepsy die from SUDEP. This is the leading cause of death in people with uncontrolled seizures. Until further answers are available, the best way to prevent SUDEP is to lower your risk by controlling seizures. Research has found that people with all types of epilepsy that experience convulsive seizures can be at risk.  Please make sure you are staying well hydrated. I recommend 50-60 ounces daily. Well balanced diet and regular exercise encouraged. Consistent sleep schedule with 6-8 hours recommended.   Please continue follow up with care team as directed.   Follow up with me in 1 year  You may receive a survey regarding today's  visit. I encourage you to leave honest feed back as I do use this information to improve patient care. Thank you for seeing me today!

## 2024-08-08 NOTE — Patient Instructions (Addendum)
 Start prednisone  20 mg tablets. Take 2 tablets by mouth once daily in the morning for 5 days.  Start clindamycin  antibiotic.  Take 1 tablet by mouth 3 times daily for 10 days.  Start buspirone 5 mg once or twice daily for anxiety/mood swings.  Continue taking Lexapro .  Please keep me updated regarding the rash.  It was a pleasure to see you today!

## 2024-08-08 NOTE — Assessment & Plan Note (Signed)
 Differentials include viral versus reflux.  Exam today with mild erythema. Rapid strep test positive.  Treat with clindamycin  300 mg 3 times daily x 10 days. Both penicillin and macrolide allergy   She will update

## 2024-08-08 NOTE — Assessment & Plan Note (Signed)
 Deteriorated.  Will continue Lexapro  20 mg daily as this has historically worked. Add buspirone 5 mg once daily for irritability/mood.  She will update.

## 2024-08-08 NOTE — Assessment & Plan Note (Signed)
 Rashes today may relate eczema/psoriasis Interestingly though, clobetasol  has been ineffective.  Start prednisone  20 mg tablets. Take 2 tablets by mouth once daily in the morning for 5 days. If no improvement, or if the rash returns, we will proceed clobetasol  shampoo/solution.  She will update.

## 2024-08-09 LAB — CBC WITH DIFFERENTIAL/PLATELET
Basophils Absolute: 0 x10E3/uL (ref 0.0–0.2)
Basos: 1 %
EOS (ABSOLUTE): 0.2 x10E3/uL (ref 0.0–0.4)
Eos: 4 %
Hematocrit: 39.7 % (ref 34.0–46.6)
Hemoglobin: 13.3 g/dL (ref 11.1–15.9)
Immature Grans (Abs): 0 x10E3/uL (ref 0.0–0.1)
Immature Granulocytes: 0 %
Lymphocytes Absolute: 1.3 x10E3/uL (ref 0.7–3.1)
Lymphs: 23 %
MCH: 30 pg (ref 26.6–33.0)
MCHC: 33.5 g/dL (ref 31.5–35.7)
MCV: 89 fL (ref 79–97)
Monocytes Absolute: 0.5 x10E3/uL (ref 0.1–0.9)
Monocytes: 10 %
Neutrophils Absolute: 3.5 x10E3/uL (ref 1.4–7.0)
Neutrophils: 62 %
Platelets: 226 x10E3/uL (ref 150–450)
RBC: 4.44 x10E6/uL (ref 3.77–5.28)
RDW: 12.9 % (ref 11.7–15.4)
WBC: 5.6 x10E3/uL (ref 3.4–10.8)

## 2024-08-09 LAB — COMPREHENSIVE METABOLIC PANEL WITH GFR
ALT: 11 IU/L (ref 0–32)
AST: 10 IU/L (ref 0–40)
Albumin: 4.2 g/dL (ref 3.8–4.9)
Alkaline Phosphatase: 86 IU/L (ref 49–135)
BUN/Creatinine Ratio: 17 (ref 9–23)
BUN: 11 mg/dL (ref 6–24)
Bilirubin Total: <0.2 mg/dL (ref 0.0–1.2)
CO2: 21 mmol/L (ref 20–29)
Calcium: 9 mg/dL (ref 8.7–10.2)
Chloride: 105 mmol/L (ref 96–106)
Creatinine, Ser: 0.66 mg/dL (ref 0.57–1.00)
Globulin, Total: 2.6 g/dL (ref 1.5–4.5)
Glucose: 72 mg/dL (ref 70–99)
Potassium: 4 mmol/L (ref 3.5–5.2)
Sodium: 139 mmol/L (ref 134–144)
Total Protein: 6.8 g/dL (ref 6.0–8.5)
eGFR: 104 mL/min/1.73 (ref >59)

## 2024-08-09 LAB — PHENOBARBITAL LEVEL: Phenobarbital, Serum: 32 ug/mL (ref 15–40)

## 2024-08-11 ENCOUNTER — Ambulatory Visit: Payer: Self-pay | Admitting: Family Medicine

## 2024-08-15 ENCOUNTER — Ambulatory Visit: Admitting: Primary Care

## 2024-08-18 ENCOUNTER — Ambulatory Visit: Payer: Self-pay

## 2024-08-18 NOTE — Telephone Encounter (Signed)
 FYI Only or Action Required?: Action required by provider: request for appointment.  Patient was last seen in primary care on 08/08/2024 by Gretta Comer POUR, NP.  Called Nurse Triage reporting Back Pain.  Symptoms began several days ago.  Interventions attempted: OTC medications: tylenol .  Symptoms are: unchanged.  Triage Disposition: See HCP Within 4 Hours (Or PCP Triage)  Patient/caregiver understands and will follow disposition?:    Copied from CRM #[id number]. Topic: Clinical - Red Word Triage >> Aug 18, 2024  3:43 PM Burnard DEL wrote: Red Word that prompted transfer to Nurse Triage: sharp pain on right side of back,takes breathe away Reason for Disposition  [1] SEVERE back pain (e.g., excruciating, unable to do any normal activities) AND [2] not improved 2 hours after pain medicine  Answer Assessment - Initial Assessment Questions No appointment available in needed time frame. Requesting appointment at office, unable to schedule due to Dispo. Told she could go to UC and office will call her to schedule. Patient does not want UC. 1. ONSET: When did the pain begin? (e.g., minutes, hours, days)     3 days ago 2. LOCATION: Where does it hurt? (upper, mid or lower back)     Upper right back/shoulder 3. SEVERITY: How bad is the pain?  (e.g., Scale 1-10; mild, moderate, or severe)     20 4. PATTERN: Is the pain constant? (e.g., yes, no; constant, intermittent)      Comes and goes, comes on suddenly 5. RADIATION: Does the pain shoot into your legs or somewhere else?     Denies 6. CAUSE:  What do you think is causing the back pain?      Denies 7. BACK OVERUSE:  Any recent lifting of heavy objects, strenuous work or exercise?     Does a lot of lifting heavy things at work 8. MEDICINES: What have you taken so far for the pain? (e.g., nothing, acetaminophen , NSAIDS)     Tylenol , isn't working 9. NEUROLOGIC SYMPTOMS: Do you have any weakness, numbness, or problems with  bowel/bladder control?     Denies  Protocols used: Back Pain-A-AH

## 2024-08-18 NOTE — Telephone Encounter (Signed)
 Can anyone see her tomorrow? I am happy to see her Wednesday but I am not sure if she wants to wait that long.

## 2024-08-19 NOTE — Telephone Encounter (Signed)
 Spoke with pt relaying Kate's message. Pt verbalizes understanding and will call first thing tomorrow morning due to no availabilities today in the office.   Fyi to Terry.

## 2024-08-19 NOTE — Telephone Encounter (Signed)
 Spoke with [name] scheduling her tomorrow at 8:40 AM with Mallie. [name] expresses her thanks.

## 2024-08-20 ENCOUNTER — Ambulatory Visit: Payer: Self-pay | Admitting: Primary Care

## 2024-08-20 ENCOUNTER — Ambulatory Visit: Admitting: Primary Care

## 2024-08-20 ENCOUNTER — Encounter: Payer: Self-pay | Admitting: Primary Care

## 2024-08-20 VITALS — BP 122/68 | HR 82 | Temp 98.2°F | Ht 65.0 in | Wt 182.1 lb

## 2024-08-20 DIAGNOSIS — M546 Pain in thoracic spine: Secondary | ICD-10-CM | POA: Diagnosis not present

## 2024-08-20 LAB — POC URINALSYSI DIPSTICK (AUTOMATED)
Bilirubin, UA: NEGATIVE
Blood, UA: NEGATIVE
Glucose, UA: NEGATIVE
Ketones, UA: NEGATIVE
Leukocytes, UA: NEGATIVE
Nitrite, UA: NEGATIVE
Protein, UA: NEGATIVE
Spec Grav, UA: 1.015
Urobilinogen, UA: 0.2 U/dL
pH, UA: 6.5

## 2024-08-20 MED ORDER — KETOROLAC TROMETHAMINE 60 MG/2ML IM SOLN
60.0000 mg | Freq: Once | INTRAMUSCULAR | Status: AC
  Administered 2024-08-20: 60 mg via INTRAMUSCULAR

## 2024-08-20 MED ORDER — METHOCARBAMOL 500 MG PO TABS: 500.0000 mg | ORAL_TABLET | Freq: Three times a day (TID) | ORAL | 0 refills | Status: AC | PRN

## 2024-08-20 NOTE — Assessment & Plan Note (Addendum)
 Appears MSK in etiology based on HPI and presentation today. No evidence of shingles, acute pyelonephritis, acute cystitis or acute cholecystitis. Urinalysis today negative  Start methocarbamol  500 mg 3 times daily as needed.  Drowsiness precautions provided. Consider prednisone  if no improvement. Will hold off as she was recently on prednisone  2 weeks ago.   Toradol  60 mg provided IM. Continue heat/ice.  Follow up as needed.

## 2024-08-20 NOTE — Patient Instructions (Signed)
 You may take methocarbamol  500 mg tablets every 8 hours for muscle spasm/back pain.  This may cause drowsiness.  Continue Tylenol , heat, stretching.  It was a pleasure to see you today!

## 2024-08-20 NOTE — Progress Notes (Signed)
 "  Subjective:    Patient ID: [name], female    DOB: August 04, 1971, 53 y.o.   MRN: [id number]  [name] is a very pleasant 53 y.o. female with a history of chronic low back pain, acute cystitis, chronic hand and foot pain who presents today to discuss back pain.   Symptom onset five days ago with right thoracic back pain. She then began noticing radiation to the RUQ of her abdomen.   She denies nausea, vomiting, diarrhea, rash, paresthesias. She can provoke her pain which is worse when raising her right arm and when twisting to the right side. She describes her back pain as sharp. She's applied ice and heat, the heating pad helps if she remains still. 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   She's taken Tylenol  without improvement.   She underwent MRI lumbar spine in September 2024 which was normal.   Review of Systems  Constitutional:  Negative for chills and fever.  Gastrointestinal:  Positive for abdominal pain. Negative for diarrhea, nausea and vomiting.  Musculoskeletal:  Positive for back pain.         Past Medical History:  Diagnosis Date   Acute neck pain 05/05/2021   Allergy     Anxiety    Asthma    worse when younger   Cellulitis of face 05/05/2021   COVID-19 virus infection 02/16/2023   Elevated blood pressure reading 08/13/2019   GERD (gastroesophageal reflux disease)    Migraine    1-2x/month   Persistent cough for 3 weeks or longer 10/09/2019   PONV (postoperative nausea and vomiting)    Seizure (HCC)    Vaginal itching 05/27/2020   Vertigo    no episodes for 7 yrs    Social History   Socioeconomic History   Marital status: Widowed    Spouse name: Dick   Number of children: 0   Years of education: 10th   Highest education level: 12th grade  Occupational History    Employer: [id number]  Tobacco Use   Smoking status: Former    Current packs/day: 0.00    Average packs/day:  1.0 packs/day    Types: Cigarettes    Quit date: 08/28/2013    Years since quitting: 10.9   Smokeless tobacco: Never   Tobacco comments:    Quit Janurary 1st  Vaping Use   Vaping status: Never Used  Substance and Sexual Activity   Alcohol use: No    Alcohol/week: 0.0 standard drinks of alcohol   Drug use: No   Sexual activity: Not Currently    Birth control/protection: Surgical  Other Topics Concern   Not on file  Social History Narrative   Patient lives alone   No children.   Works at [employer].   Enjoys going to [employer], going to estate sells.   Right handed   Caffeine: 1 Dr. Nunzio all day long (5-6)   Social Drivers of Health   Tobacco Use: Medium Risk (08/20/2024)   Patient History    Smoking Tobacco Use: Former    Smokeless Tobacco Use: Never    Passive Exposure: Not on file  Financial Resource Strain: Low Risk (08/07/2024)   Overall Financial Resource Strain (CARDIA)    Difficulty of Paying Living Expenses: Not hard at all  Food Insecurity: Patient Declined (08/07/2024)   Epic    Worried About Radiation Protection Practitioner of Food in the Last Year: Patient declined    [employer] Officer of  Food in the Last Year: Patient declined  Transportation Needs: No Transportation Needs (08/07/2024)   Epic    Lack of Transportation (Medical): No    Lack of Transportation (Non-Medical): No  Physical Activity: Inactive (08/07/2024)   Exercise Vital Sign    Days of Exercise per Week: 0 days    Minutes of Exercise per Session: Not on file  Stress: Stress Concern Present (08/07/2024)   [employer] of Occupational Health - Occupational Stress Questionnaire    Feeling of Stress: Very much  Social Connections: Unknown (08/07/2024)   Social Connection and Isolation Panel    Frequency of Communication with Friends and Family: Twice a week    Frequency of Social Gatherings with Friends and Family: Patient declined    Attends Religious Services: Never    Database Administrator or Organizations: No     Attends Engineer, Structural: Not on file    Marital Status: Widowed  Recent Concern: Social Connections - Socially Isolated (05/15/2024)   Social Connection and Isolation Panel    Frequency of Communication with Friends and Family: Three times a week    Frequency of Social Gatherings with Friends and Family: Patient declined    Attends Religious Services: Never    Database Administrator or Organizations: No    Attends Engineer, Structural: Not on file    Marital Status: Widowed  Intimate Partner Violence: Not on file  Depression (PHQ2-9): Low Risk (08/08/2024)   Depression (PHQ2-9)    PHQ-2 Score: 0  Alcohol Screen: Not on file  Housing: Unknown (08/07/2024)   Epic    Unable to Pay for Housing in the Last Year: No    Number of Times Moved in the Last Year: Not on file    Homeless in the Last Year: Not on file  Utilities: Not on file  Health Literacy: Not on file    Past Surgical History:  Procedure Laterality Date   ABDOMINAL HYSTERECTOMY  2012   COLONOSCOPY     COLONOSCOPY WITH PROPOFOL  N/A 04/24/2019   Procedure: COLONOSCOPY WITH PROPOFOL ;  Surgeon: Janalyn Keene NOVAK, MD;  Location: [facility];  Service: Endoscopy;  Laterality: N/A;   ESOPHAGOGASTRODUODENOSCOPY (EGD) WITH PROPOFOL  N/A 04/24/2019   Procedure: ESOPHAGOGASTRODUODENOSCOPY (EGD) WITH PROPOFOL ;  Surgeon: Janalyn Keene NOVAK, MD;  Location: [facility];  Service: Endoscopy;  Laterality: N/A;   FOOT SURGERY Right 01/24/2022   HAND SURGERY Right 05/07/2020   trigger release   UPPER GASTROINTESTINAL ENDOSCOPY      Family History  Problem Relation Age of Onset   Stroke Mother    Hypertension Mother    Colon cancer Neg Hx    Esophageal cancer Neg Hx    Stomach cancer Neg Hx    Rectal cancer Neg Hx    Breast cancer Neg Hx    Seizures Neg Hx    Migraines Neg Hx    Sleep apnea Neg Hx     Allergies[1]  Medications Ordered Prior to Encounter[2]  BP 122/68   Pulse 82   Temp 98.2  F (36.8 C) (Oral)   Ht 5' 5 (1.651 m)   Wt 182 lb 2 oz (82.6 kg)   SpO2 95%   BMI 30.31 kg/m  Objective:   Physical Exam Constitutional:      Appearance: She is not ill-appearing.  Cardiovascular:     Rate and Rhythm: Normal rate.  Pulmonary:     Effort: Pulmonary effort is normal.  Abdominal:     Tenderness:  There is no abdominal tenderness.  Musculoskeletal:     Thoracic back: Tenderness present. No swelling. Decreased range of motion.       Back:  Skin:    General: Skin is warm and dry.     Findings: No rash.  Neurological:     Mental Status: She is alert.     Physical Exam        Assessment & Plan:  Acute right-sided thoracic back pain Assessment & Plan: Appears MSK in etiology based on HPI and presentation today. No evidence of shingles, acute pyelonephritis, acute cystitis or acute cholecystitis. Urinalysis today negative  Start methocarbamol  500 mg 3 times daily as needed.  Drowsiness precautions provided. Consider prednisone  if no improvement. Will hold off as she was recently on prednisone  2 weeks ago.   Toradol  60 mg provided IM. Continue heat/ice.  Follow up as needed.   Orders: -     POCT Urinalysis Dipstick (Automated) -     Ketorolac  Tromethamine  -     Methocarbamol ; Take 1 tablet (500 mg total) by mouth every 8 (eight) hours as needed for muscle spasms.  Dispense: 15 tablet; Refill: 0    Assessment and Plan Assessment & Plan         Comer MARLA Gaskins, NP       [1]  Allergies Allergen Reactions   Asa Buff (Mag [Buffered Aspirin ] Other (See Comments)    Stomach pain   Aspirin  Other (See Comments)   Azithromycin Other (See Comments)    GI problems   Ibuprofen Other (See Comments)    GI upset   Penicillins Other (See Comments)    Unknown childhood allergic reaction Has patient had a PCN reaction causing immediate rash, facial/tongue/throat swelling, SOB or lightheadedness with hypotension: Unknown Has patient had a PCN  reaction causing severe rash involving mucus membranes or skin necrosis: Unknown Has patient had a PCN reaction that required hospitalization: Unknown Has patient had a PCN reaction occurring within the last 10 years: No If all of the above answers are NO, then may proceed with Cephalosporin use.  [2]  Current Outpatient Medications on File Prior to Visit  Medication Sig Dispense Refill   albuterol  (VENTOLIN  HFA) 108 (90 Base) MCG/ACT inhaler Inhale 2 puffs into the lungs every 6 (six) hours as needed for wheezing or shortness of breath. 6.7 g 0   busPIRone  (BUSPAR ) 5 MG tablet Take 1 tablet (5 mg total) by mouth 2 (two) times daily. For mood 180 tablet 0   clobetasol  cream (TEMOVATE ) 0.05 % APPLY  CREAM TOPICALLY TO AFFECTED AREA TWICE DAILY 30 g 0   doxycycline  (VIBRA -TABS) 100 MG tablet Take 1 tablet (100 mg total) by mouth daily. With food and plenty of fluid 30 tablet 1   escitalopram  (LEXAPRO ) 20 MG tablet TAKE 1 TABLET BY MOUTH ONCE DAILY FOR ANXIETY 90 tablet 2   ondansetron  (ZOFRAN -ODT) 4 MG disintegrating tablet Take 1 tablet (4 mg total) by mouth every 8 (eight) hours as needed for nausea or vomiting. 15 tablet 0   PHENobarbital  (LUMINAL) 97.2 MG tablet Take 2 tablets (194.4 mg total) by mouth daily. 180 tablet 1   predniSONE  (DELTASONE ) 20 MG tablet Take 2 tablets by mouth once daily in the morning for 5 days. 10 tablet 0   rizatriptan  (MAXALT -MLT) 10 MG disintegrating tablet DISSOLVE 1 TABLET IN MOUTH ONCE DAILY AS NEEDED FOR MIGRAINE. MAY REPEAT IN 2 HOURS IF NEEDED 9 tablet 11   tretinoin  (RETIN-A ) 0.1 % cream Apply  topically at bedtime. Apply pea size amount to face every other night and increase as tolerated. 45 g 4   No current facility-administered medications on file prior to visit.   "

## 2024-08-26 ENCOUNTER — Other Ambulatory Visit: Payer: Self-pay | Admitting: Primary Care

## 2024-08-26 DIAGNOSIS — L309 Dermatitis, unspecified: Secondary | ICD-10-CM

## 2024-09-02 ENCOUNTER — Ambulatory Visit: Admitting: Primary Care

## 2024-09-09 ENCOUNTER — Other Ambulatory Visit: Payer: Self-pay | Admitting: Primary Care

## 2024-09-09 DIAGNOSIS — F411 Generalized anxiety disorder: Secondary | ICD-10-CM

## 2024-09-10 ENCOUNTER — Other Ambulatory Visit: Payer: Self-pay | Admitting: Primary Care

## 2024-09-10 DIAGNOSIS — F411 Generalized anxiety disorder: Secondary | ICD-10-CM

## 2024-09-19 ENCOUNTER — Ambulatory Visit: Admitting: Primary Care

## 2024-09-26 ENCOUNTER — Ambulatory Visit: Admitting: Podiatry

## 2024-10-03 ENCOUNTER — Encounter: Admitting: Primary Care

## 2024-10-06 ENCOUNTER — Ambulatory Visit: Admitting: Podiatry

## 2024-10-10 ENCOUNTER — Encounter: Admitting: Primary Care

## 2024-12-15 ENCOUNTER — Ambulatory Visit: Admitting: Family Medicine

## 2024-12-30 ENCOUNTER — Ambulatory Visit: Admitting: Physician Assistant

## 2025-08-25 ENCOUNTER — Ambulatory Visit: Admitting: Family Medicine
